# Patient Record
Sex: Male | Born: 1960 | Race: White | Hispanic: No | Marital: Married | Smoking: Never smoker
Health system: Southern US, Community
[De-identification: ages and names within clinical notes are randomized; demographics above are authoritative.]

## PROBLEM LIST (undated history)

## (undated) DIAGNOSIS — J189 Pneumonia, unspecified organism: Secondary | ICD-10-CM

## (undated) DIAGNOSIS — D649 Anemia, unspecified: Secondary | ICD-10-CM

## (undated) DIAGNOSIS — K219 Gastro-esophageal reflux disease without esophagitis: Secondary | ICD-10-CM

## (undated) DIAGNOSIS — H9319 Tinnitus, unspecified ear: Secondary | ICD-10-CM

## (undated) DIAGNOSIS — C8190 Hodgkin lymphoma, unspecified, unspecified site: Secondary | ICD-10-CM

## (undated) DIAGNOSIS — F32A Depression, unspecified: Secondary | ICD-10-CM

## (undated) DIAGNOSIS — K649 Unspecified hemorrhoids: Secondary | ICD-10-CM

## (undated) DIAGNOSIS — F329 Major depressive disorder, single episode, unspecified: Secondary | ICD-10-CM

## (undated) DIAGNOSIS — E785 Hyperlipidemia, unspecified: Secondary | ICD-10-CM

## (undated) DIAGNOSIS — E291 Testicular hypofunction: Secondary | ICD-10-CM

## (undated) DIAGNOSIS — E119 Type 2 diabetes mellitus without complications: Secondary | ICD-10-CM

## (undated) DIAGNOSIS — K635 Polyp of colon: Secondary | ICD-10-CM

## (undated) DIAGNOSIS — I10 Essential (primary) hypertension: Secondary | ICD-10-CM

## (undated) HISTORY — PX: EYE SURGERY: SHX253

## (undated) HISTORY — DX: Hodgkin lymphoma, unspecified, unspecified site: C81.90

---

## 2010-10-21 ENCOUNTER — Ambulatory Visit: Payer: Self-pay | Admitting: Ophthalmology

## 2010-11-18 ENCOUNTER — Ambulatory Visit: Payer: Self-pay | Admitting: Ophthalmology

## 2014-07-22 ENCOUNTER — Ambulatory Visit: Payer: Self-pay | Admitting: Gastroenterology

## 2014-11-04 LAB — SURGICAL PATHOLOGY

## 2015-04-04 ENCOUNTER — Encounter: Payer: Self-pay | Admitting: *Deleted

## 2015-04-07 ENCOUNTER — Ambulatory Visit: Payer: PRIVATE HEALTH INSURANCE | Admitting: Anesthesiology

## 2015-04-07 ENCOUNTER — Ambulatory Visit
Admission: RE | Admit: 2015-04-07 | Discharge: 2015-04-07 | Disposition: A | Discharge status: Home / Self Care | Payer: PRIVATE HEALTH INSURANCE | Source: Ambulatory Visit | Attending: Gastroenterology | Admitting: Gastroenterology

## 2015-04-07 ENCOUNTER — Ambulatory Visit: Admit: 2015-04-07 | Payer: Self-pay | Admitting: Gastroenterology

## 2015-04-07 ENCOUNTER — Encounter
Admission: RE | Disposition: A | Discharge status: Home / Self Care | Payer: Self-pay | Source: Ambulatory Visit | Attending: Gastroenterology

## 2015-04-07 ENCOUNTER — Encounter: Payer: Self-pay | Admitting: *Deleted

## 2015-04-07 DIAGNOSIS — E785 Hyperlipidemia, unspecified: Secondary | ICD-10-CM | POA: Insufficient documentation

## 2015-04-07 DIAGNOSIS — Z86010 Personal history of colonic polyps: Secondary | ICD-10-CM | POA: Diagnosis present

## 2015-04-07 DIAGNOSIS — Z79899 Other long term (current) drug therapy: Secondary | ICD-10-CM | POA: Insufficient documentation

## 2015-04-07 DIAGNOSIS — F329 Major depressive disorder, single episode, unspecified: Secondary | ICD-10-CM | POA: Diagnosis not present

## 2015-04-07 DIAGNOSIS — E291 Testicular hypofunction: Secondary | ICD-10-CM | POA: Diagnosis not present

## 2015-04-07 DIAGNOSIS — E119 Type 2 diabetes mellitus without complications: Secondary | ICD-10-CM | POA: Diagnosis not present

## 2015-04-07 DIAGNOSIS — D120 Benign neoplasm of cecum: Secondary | ICD-10-CM | POA: Insufficient documentation

## 2015-04-07 DIAGNOSIS — Z791 Long term (current) use of non-steroidal anti-inflammatories (NSAID): Secondary | ICD-10-CM | POA: Diagnosis not present

## 2015-04-07 DIAGNOSIS — I10 Essential (primary) hypertension: Secondary | ICD-10-CM | POA: Diagnosis not present

## 2015-04-07 HISTORY — DX: Hyperlipidemia, unspecified: E78.5

## 2015-04-07 HISTORY — DX: Essential (primary) hypertension: I10

## 2015-04-07 HISTORY — DX: Type 2 diabetes mellitus without complications: E11.9

## 2015-04-07 HISTORY — DX: Polyp of colon: K63.5

## 2015-04-07 HISTORY — PX: COLONOSCOPY WITH PROPOFOL: SHX5780

## 2015-04-07 HISTORY — DX: Tinnitus, unspecified ear: H93.19

## 2015-04-07 HISTORY — DX: Unspecified hemorrhoids: K64.9

## 2015-04-07 HISTORY — DX: Major depressive disorder, single episode, unspecified: F32.9

## 2015-04-07 HISTORY — DX: Testicular hypofunction: E29.1

## 2015-04-07 HISTORY — DX: Depression, unspecified: F32.A

## 2015-04-07 LAB — GLUCOSE, CAPILLARY: Glucose-Capillary: 88 mg/dL (ref 65–99)

## 2015-04-07 SURGERY — COLONOSCOPY WITH PROPOFOL
Anesthesia: General

## 2015-04-07 MED ORDER — SODIUM CHLORIDE 0.9 % IV SOLN: INTRAVENOUS | Status: DC

## 2015-04-07 MED ORDER — SODIUM CHLORIDE 0.9 % IV SOLN
INTRAVENOUS | Status: DC
  Administered 2015-04-07: 1000 mL via INTRAVENOUS

## 2015-04-07 MED ORDER — LIDOCAINE HCL (CARDIAC) 20 MG/ML IV SOLN
INTRAVENOUS | Status: DC | PRN
  Administered 2015-04-07: 30 mg via INTRAVENOUS

## 2015-04-07 MED ORDER — PROPOFOL 10 MG/ML IV BOLUS
INTRAVENOUS | Status: DC | PRN
  Administered 2015-04-07: 110 mg via INTRAVENOUS

## 2015-04-07 MED ORDER — PROPOFOL 500 MG/50ML IV EMUL
INTRAVENOUS | Status: DC | PRN
  Administered 2015-04-07: 125 ug/kg/min via INTRAVENOUS

## 2015-04-07 NOTE — H&P (Signed)
    Primary Care Physician:  [place] Pink, MD Primary Gastroenterologist:  Dr. Candace Cruise  Pre-Procedure History & Physical: HPI:  Erik Buchanan is a 54 y.o. male is here for an colonoscopy.   Past Medical History  Diagnosis Date  . Hypertension   . Depression   . Diabetes mellitus without complication   . Hyperlipidemia   . Testicular hypofunction   . Hemorrhoids   . Tinnitus   . Colon polyps     No past surgical history on file.  Prior to Admission medications   Medication Sig Start Date End Date Taking? Authorizing Provider  amLODipine (NORVASC) 5 MG tablet Take 5 mg by mouth daily.   Yes Historical Provider, MD  buPROPion (WELLBUTRIN XL) 150 MG 24 hr tablet Take 150 mg by mouth daily.   Yes Historical Provider, MD  Cholecalciferol 2000 UNITS CAPS Take by mouth.   Yes Historical Provider, MD  co-enzyme Q-10 30 MG capsule Take 30 mg by mouth 3 (three) times daily.   Yes Historical Provider, MD  ibuprofen (ADVIL,MOTRIN) 200 MG tablet Take 200 mg by mouth every 6 (six) hours as needed.   Yes Historical Provider, MD  lisinopril (PRINIVIL,ZESTRIL) 10 MG tablet Take 10 mg by mouth daily.   Yes Historical Provider, MD  metFORMIN (GLUCOPHAGE-XR) 500 MG 24 hr tablet Take 500 mg by mouth daily with breakfast.   Yes Historical Provider, MD  OMEGA-3 FATTY ACIDS PO Take by mouth.   Yes Historical Provider, MD  simvastatin (ZOCOR) 20 MG tablet Take by mouth daily.   Yes Historical Provider, MD  Testosterone Cypionate 200 MG/ML KIT Inject into the muscle.   Yes Historical Provider, MD  tetrahydrozoline-zinc (VISINE-AC) 0.05-0.25 % ophthalmic solution 2 drops 3 (three) times daily as needed.   Yes Historical Provider, MD  Triamcinolone Acetonide (NASACORT AQ NA) Place into the nose.   Yes Historical Provider, MD    Allergies as of 04/04/2015  . (Not on File)    No family history on file.  Social History   Social History  . Marital Status: Married    Spouse Name: N/A  . Number of  Children: N/A  . Years of Education: N/A   Occupational History  . Not on file.   Social History Main Topics  . Smoking status: Not on file  . Smokeless tobacco: Not on file  . Alcohol Use: Not on file  . Drug Use: Not on file  . Sexual Activity: Not on file   Other Topics Concern  . Not on file   Social History Narrative  . No narrative on file    Review of Systems: See HPI, otherwise negative ROS  Physical Exam: There were no vitals taken for this visit. General:   Alert,  pleasant and cooperative in NAD Head:  Normocephalic and atraumatic. Neck:  Supple; no masses or thyromegaly. Lungs:  Clear throughout to auscultation.    Heart:  Regular rate and rhythm. Abdomen:  Soft, nontender and nondistended. Normal bowel sounds, without guarding, and without rebound.   Neurologic:  Alert and  oriented x4;  grossly normal neurologically.  Impression/Plan: Erik Buchanan is here for an colonoscopy to be performed for personal hx of colon polyps. Risks, benefits, limitations, and alternatives regarding colonoscopy have been reviewed with the patient.  Questions have been answered.  All parties agreeable.   OH, Lupita Dawn, MD  04/07/2015, 9:45 AM

## 2015-04-07 NOTE — Op Note (Signed)
[facility] Gastroenterology Patient Name: Erik Buchanan Procedure Date: 04/07/2015 11:13 AM MRN: [id number] Account #: [id number] Date of Birth: 1960/10/21 Admit Type: Outpatient Age: 54 Room: [facility] ENDO ROOM 4 Gender: Male Note Status: Finalized Procedure:         Colonoscopy Indications:       Personal history of colonic polyps Providers:         Lupita Dawn. Candace Cruise, MD Referring MD:      Irven Easterly. Kary Kos, MD (Referring MD) Medicines:         Monitored Anesthesia Care Complications:     No immediate complications. Procedure:         Pre-Anesthesia Assessment:                    - Prior to the procedure, a History and Physical was                     performed, and patient medications, allergies and                     sensitivities were reviewed. The patient's tolerance of                     previous anesthesia was reviewed.                    - The risks and benefits of the procedure and the sedation                     options and risks were discussed with the patient. All                     questions were answered and informed consent was obtained.                    - After reviewing the risks and benefits, the patient was                     deemed in satisfactory condition to undergo the procedure.                    After obtaining informed consent, the colonoscope was                     passed under direct vision. Throughout the procedure, the                     patient's blood pressure, pulse, and oxygen saturations                     were monitored continuously. The Colonoscope was                     introduced through the anus and advanced to the the cecum,                     identified by appendiceal orifice and ileocecal valve. The                     colonoscopy was performed without difficulty. The patient                     tolerated the procedure well. The quality of the bowel  preparation was fair. Findings:      A  medium polyp was found in the cecum. The polyp was sessile. The polyp       was removed with a hot snare. Resection and retrieval were complete.       This polyp was remnant from previous polyp. Clip still remains. Both the       polyp and clip snared off. To prevent bleeding post-intervention, three       hemostatic clips were successfully placed. There was no bleeding at the       end of the procedure.      The exam was otherwise without abnormality. Impression:        - One medium polyp in the cecum. Resected and retrieved.                     Clips were placed.                    - The examination was otherwise normal. Recommendation:    - Discharge patient to home.                    - Await pathology results.                    - The findings and recommendations were discussed with the                     patient. Procedure Code(s): --- Professional ---                    [phone], Colonoscopy, flexible; with removal of tumor(s),                     polyp(s), or other lesion(s) by snare technique Diagnosis Code(s): --- Professional ---                    D12.0, Benign neoplasm of cecum                    Z86.010, Personal history of colonic polyps CPT copyright 2014 American Medical Association. All rights reserved. The codes documented in this report are preliminary and upon coder review may  be revised to meet current compliance requirements. Hulen Luster, MD 04/07/2015 11:40:37 AM This report has been signed electronically. Number of Addenda: 0 Note Initiated On: 04/07/2015 11:13 AM Scope Withdrawal Time: 0 hours 13 minutes 58 seconds  Total Procedure Duration: 0 hours 19 minutes 16 seconds       [facility]

## 2015-04-07 NOTE — Anesthesia Preprocedure Evaluation (Signed)
Anesthesia Evaluation  Patient identified by MRN, date of birth, ID band Patient awake    Reviewed: Allergy & Precautions, H&P , NPO status , Patient's Chart, lab work & pertinent test results, reviewed documented beta blocker date and time   History of Anesthesia Complications Negative for: history of anesthetic complications  Airway Mallampati: II  TM Distance: >3 FB Neck ROM: full    Dental no notable dental hx. (+) Teeth Intact   Pulmonary neg pulmonary ROS,    Pulmonary exam normal breath sounds clear to auscultation       Cardiovascular Exercise Tolerance: Good hypertension, On Medications (-) angina(-) CAD, (-) Past MI, (-) Cardiac Stents and (-) CABG Normal cardiovascular exam(-) dysrhythmias (-) Valvular Problems/Murmurs Rhythm:regular Rate:Normal     Neuro/Psych PSYCHIATRIC DISORDERS (Depression) negative neurological ROS     GI/Hepatic negative GI ROS, Neg liver ROS,   Endo/Other  diabetes, Well Controlled, Oral Hypoglycemic Agents  Renal/GU negative Renal ROS  negative genitourinary   Musculoskeletal   Abdominal   Peds  Hematology negative hematology ROS (+)   Anesthesia Other Findings Past Medical History:   Hypertension                                                 Depression                                                   Diabetes mellitus without complication                       Hyperlipidemia                                               Testicular hypofunction                                      Hemorrhoids                                                  Tinnitus                                                     Colon polyps                                                 Reproductive/Obstetrics negative OB ROS                             Anesthesia Physical Anesthesia Plan  ASA: II  Anesthesia Plan: General  Post-op Pain Management:    Induction:    Airway Management Planned:   Additional Equipment:   Intra-op Plan:   Post-operative Plan:   Informed Consent: I have reviewed the patients History and Physical, chart, labs and discussed the procedure including the risks, benefits and alternatives for the proposed anesthesia with the patient or authorized representative who has indicated his/her understanding and acceptance.   Dental Advisory Given  Plan Discussed with: Anesthesiologist, CRNA and Surgeon  Anesthesia Plan Comments:         Anesthesia Quick Evaluation

## 2015-04-07 NOTE — Transfer of Care (Signed)
Immediate Anesthesia Transfer of Care Note  Patient: Erik Buchanan  Procedure(s) Performed: Procedure(s): COLONOSCOPY WITH PROPOFOL (N/A)  Patient Location: Endoscopy Unit  Anesthesia Type:General  Level of Consciousness: sedated  Airway & Oxygen Therapy: Patient Spontanous Breathing and Patient connected to nasal cannula oxygen  Post-op Assessment: Report given to RN and Post -op Vital signs reviewed and stable  Post vital signs: Reviewed and stable  Last Vitals:  Filed Vitals:   04/07/15 1001  BP: 129/71  Pulse: 60  Temp: 36.9 C  Resp: 18    Complications: No apparent anesthesia complications

## 2015-04-08 ENCOUNTER — Encounter: Payer: Self-pay | Admitting: Gastroenterology

## 2015-04-09 LAB — SURGICAL PATHOLOGY

## 2015-04-09 NOTE — Anesthesia Postprocedure Evaluation (Signed)
  Anesthesia Post-op Note  Patient: Erik Buchanan  Procedure(s) Performed: Procedure(s): COLONOSCOPY WITH PROPOFOL (N/A)  Anesthesia type:General  Patient location: PACU  Post pain: Pain level controlled  Post assessment: Post-op Vital signs reviewed, Patient's Cardiovascular Status Stable, Respiratory Function Stable, Patent Airway and No signs of Nausea or vomiting  Post vital signs: Reviewed and stable  Last Vitals:  Filed Vitals:   04/07/15 1210  BP: 132/83  Pulse: 53  Temp:   Resp: 16    Level of consciousness: awake, alert  and patient cooperative  Complications: No apparent anesthesia complications

## 2017-02-13 ENCOUNTER — Emergency Department: Payer: 59

## 2017-02-13 ENCOUNTER — Emergency Department
Admission: EM | Admit: 2017-02-13 | Discharge: 2017-02-13 | Disposition: A | Discharge status: Home / Self Care | Payer: 59 | Attending: Emergency Medicine | Admitting: Emergency Medicine

## 2017-02-13 DIAGNOSIS — Y9389 Activity, other specified: Secondary | ICD-10-CM | POA: Diagnosis not present

## 2017-02-13 DIAGNOSIS — S42142A Displaced fracture of glenoid cavity of scapula, left shoulder, initial encounter for closed fracture: Secondary | ICD-10-CM | POA: Insufficient documentation

## 2017-02-13 DIAGNOSIS — Y929 Unspecified place or not applicable: Secondary | ICD-10-CM | POA: Insufficient documentation

## 2017-02-13 DIAGNOSIS — Z79899 Other long term (current) drug therapy: Secondary | ICD-10-CM | POA: Insufficient documentation

## 2017-02-13 DIAGNOSIS — Y999 Unspecified external cause status: Secondary | ICD-10-CM | POA: Diagnosis not present

## 2017-02-13 DIAGNOSIS — I10 Essential (primary) hypertension: Secondary | ICD-10-CM | POA: Insufficient documentation

## 2017-02-13 DIAGNOSIS — Z7984 Long term (current) use of oral hypoglycemic drugs: Secondary | ICD-10-CM | POA: Insufficient documentation

## 2017-02-13 DIAGNOSIS — S42152A Displaced fracture of neck of scapula, left shoulder, initial encounter for closed fracture: Secondary | ICD-10-CM | POA: Insufficient documentation

## 2017-02-13 DIAGNOSIS — S32010A Wedge compression fracture of first lumbar vertebra, initial encounter for closed fracture: Secondary | ICD-10-CM | POA: Diagnosis not present

## 2017-02-13 DIAGNOSIS — E119 Type 2 diabetes mellitus without complications: Secondary | ICD-10-CM | POA: Diagnosis not present

## 2017-02-13 DIAGNOSIS — S4992XA Unspecified injury of left shoulder and upper arm, initial encounter: Secondary | ICD-10-CM | POA: Diagnosis present

## 2017-02-13 MED ORDER — OXYCODONE-ACETAMINOPHEN 5-325 MG PO TABS: 1.0000 | ORAL_TABLET | ORAL | 0 refills | Status: DC | PRN

## 2017-02-13 MED ORDER — HYDROMORPHONE HCL 1 MG/ML IJ SOLN
1.0000 mg | Freq: Once | INTRAMUSCULAR | Status: AC
  Administered 2017-02-13: 1 mg via INTRAVENOUS
  Filled 2017-02-13: qty 1

## 2017-02-13 MED ORDER — MORPHINE SULFATE (PF) 4 MG/ML IV SOLN
4.0000 mg | Freq: Once | INTRAVENOUS | Status: AC
  Administered 2017-02-13: 4 mg via INTRAVENOUS
  Filled 2017-02-13: qty 1

## 2017-02-13 NOTE — ED Notes (Signed)
[employer] rep at bedside to fit patient for back brace.
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
## 2017-02-13 NOTE — Discharge Instructions (Signed)
Please seek medical attention for any high fevers, chest pain, shortness of breath, change in behavior, persistent vomiting, bloody stool or any other new or concerning symptoms.  

## 2017-02-13 NOTE — ED Notes (Signed)
Pt returned from Xray at this time  

## 2017-02-13 NOTE — ED Notes (Signed)
Reviewed d/c instructions, follow-up care, prescription, use of ice, sling care, back brace use with patient. Pt verbalized understanding
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
## 2020-08-28 ENCOUNTER — Other Ambulatory Visit: Payer: Self-pay

## 2020-08-28 ENCOUNTER — Other Ambulatory Visit
Admission: RE | Admit: 2020-08-28 | Discharge: 2020-08-28 | Disposition: A | Discharge status: Home / Self Care | Payer: 59 | Source: Ambulatory Visit | Attending: Internal Medicine | Admitting: Internal Medicine

## 2020-08-28 DIAGNOSIS — Z01812 Encounter for preprocedural laboratory examination: Secondary | ICD-10-CM | POA: Insufficient documentation

## 2020-08-28 DIAGNOSIS — Z20822 Contact with and (suspected) exposure to covid-19: Secondary | ICD-10-CM | POA: Insufficient documentation

## 2020-08-28 LAB — SARS CORONAVIRUS 2 (TAT 6-24 HRS): SARS Coronavirus 2: NEGATIVE

## 2020-09-01 ENCOUNTER — Ambulatory Visit: Payer: 59 | Admitting: Anesthesiology

## 2020-09-01 ENCOUNTER — Ambulatory Visit
Admission: RE | Admit: 2020-09-01 | Discharge: 2020-09-01 | Disposition: A | Discharge status: Home / Self Care | Payer: 59 | Attending: Internal Medicine | Admitting: Internal Medicine

## 2020-09-01 ENCOUNTER — Other Ambulatory Visit: Payer: Self-pay

## 2020-09-01 ENCOUNTER — Encounter: Payer: Self-pay | Admitting: Internal Medicine

## 2020-09-01 ENCOUNTER — Encounter
Admission: RE | Disposition: A | Discharge status: Home / Self Care | Payer: Self-pay | Source: Home / Self Care | Attending: Internal Medicine

## 2020-09-01 DIAGNOSIS — Z7984 Long term (current) use of oral hypoglycemic drugs: Secondary | ICD-10-CM | POA: Diagnosis not present

## 2020-09-01 DIAGNOSIS — K640 First degree hemorrhoids: Secondary | ICD-10-CM | POA: Diagnosis not present

## 2020-09-01 DIAGNOSIS — K5730 Diverticulosis of large intestine without perforation or abscess without bleeding: Secondary | ICD-10-CM | POA: Insufficient documentation

## 2020-09-01 DIAGNOSIS — Z86010 Personal history of colonic polyps: Secondary | ICD-10-CM | POA: Diagnosis not present

## 2020-09-01 DIAGNOSIS — Q438 Other specified congenital malformations of intestine: Secondary | ICD-10-CM | POA: Diagnosis not present

## 2020-09-01 DIAGNOSIS — Z79899 Other long term (current) drug therapy: Secondary | ICD-10-CM | POA: Insufficient documentation

## 2020-09-01 DIAGNOSIS — Z885 Allergy status to narcotic agent status: Secondary | ICD-10-CM | POA: Diagnosis not present

## 2020-09-01 DIAGNOSIS — Z1211 Encounter for screening for malignant neoplasm of colon: Secondary | ICD-10-CM | POA: Insufficient documentation

## 2020-09-01 HISTORY — PX: COLONOSCOPY WITH PROPOFOL: SHX5780

## 2020-09-01 LAB — GLUCOSE, CAPILLARY: Glucose-Capillary: 123 mg/dL — ABNORMAL HIGH (ref 70–99)

## 2020-09-01 SURGERY — COLONOSCOPY WITH PROPOFOL
Anesthesia: General

## 2020-09-01 MED ORDER — PROPOFOL 500 MG/50ML IV EMUL
INTRAVENOUS | Status: DC | PRN
  Administered 2020-09-01: 130 ug/kg/min via INTRAVENOUS

## 2020-09-01 MED ORDER — PROPOFOL 500 MG/50ML IV EMUL
INTRAVENOUS | Status: AC
  Filled 2020-09-01: qty 50

## 2020-09-01 MED ORDER — PROPOFOL 10 MG/ML IV BOLUS
INTRAVENOUS | Status: DC | PRN
  Administered 2020-09-01: 100 mg via INTRAVENOUS

## 2020-09-01 MED ORDER — LIDOCAINE HCL (CARDIAC) PF 100 MG/5ML IV SOSY
PREFILLED_SYRINGE | INTRAVENOUS | Status: DC | PRN
  Administered 2020-09-01: 50 mg via INTRAVENOUS

## 2020-09-01 MED ORDER — SODIUM CHLORIDE 0.9 % IV SOLN
INTRAVENOUS | Status: DC
  Administered 2020-09-01: 20 mL/h via INTRAVENOUS

## 2020-09-01 NOTE — Op Note (Signed)
[facility] Gastroenterology Patient Name: Erik Buchanan Procedure Date: 09/01/2020 10:24 AM MRN: [id number] Account #: [id number] Date of Birth: 10-18-1960 Admit Type: Outpatient Age: 60 Room: [facility] ENDO ROOM 2 Gender: Male Note Status: Finalized Procedure:             Colonoscopy Indications:           Surveillance: Personal history of adenomatous polyps                         on last colonoscopy > 5 years ago Providers:             Lorie Apley K. Alice Reichert MD, MD Referring MD:          Irven Easterly. Kary Kos, MD (Referring MD) Medicines:             Propofol per Anesthesia Complications:         No immediate complications. Procedure:             Pre-Anesthesia Assessment:                        - The risks and benefits of the procedure and the                         sedation options and risks were discussed with the                         patient. All questions were answered and informed                         consent was obtained.                        - Patient identification and proposed procedure were                         verified prior to the procedure by the nurse. The                         procedure was verified in the procedure room.                        - ASA Grade Assessment: III - A patient with severe                         systemic disease.                        - After reviewing the risks and benefits, the patient                         was deemed in satisfactory condition to undergo the                         procedure.                        After obtaining informed consent, the colonoscope was                         passed under direct  vision. Throughout the procedure,                         the patient's blood pressure, pulse, and oxygen                         saturations were monitored continuously. The                         Colonoscope was introduced through the anus and                         advanced to the the cecum,  identified by appendiceal                         orifice and ileocecal valve. The colonoscopy was                         technically difficult and complex due to poor                         endoscopic visualization. Successful completion of the                         procedure was aided by lavage. The colonoscopy was                         technically difficult and complex due to a redundant                         colon. Successful completion of the procedure was                         aided by applying abdominal pressure. The patient                         tolerated the procedure well. The quality of the bowel                         preparation was fair. The colonoscopy was technically                         difficult and complex due to significant looping.                         Successful completion of the procedure was aided by                         withdrawing the scope and replacing with the adult                         colonoscope. The patient tolerated the procedure well. Findings:      The perianal and digital rectal examinations were normal. Pertinent       negatives include normal sphincter tone and no palpable rectal lesions.      Non-bleeding internal hemorrhoids were found during retroflexion. The       hemorrhoids were Grade I (internal hemorrhoids that do not prolapse).  Many small-mouthed diverticula were found in the sigmoid colon.      A medium polyp was found in the cecum. The polyp was carpet-like.       Biopsies were taken with a cold forceps for histology. Unable to resect       due to position of the polyp and suboptimal quality of the prep      Extensive amounts of stool was found in the transverse colon, in the       ascending colon and in the cecum, interfering with visualization. Lavage       of the area was performed using a large amount of sterile water,       resulting in incomplete clearance with fair visualization.      Non-bleeding  internal hemorrhoids were found during retroflexion. The       hemorrhoids were Grade I (internal hemorrhoids that do not prolapse).      The exam was otherwise without abnormality. Impression:            - Preparation of the colon was fair.                        - Non-bleeding internal hemorrhoids.                        - Diverticulosis in the sigmoid colon.                        - One medium polyp in the cecum. Biopsied.                        - Stool in the transverse colon, in the ascending                         colon and in the cecum.                        - Non-bleeding internal hemorrhoids.                        - The examination was otherwise normal. Recommendation:        - Patient has a contact number available for                         emergencies. The signs and symptoms of potential                         delayed complications were discussed with the patient.                         Return to normal activities tomorrow. Written                         discharge instructions were provided to the patient.                        - Resume previous diet.                        - Continue present medications.                        -  Await pathology results.                        - [facility] referral for definitive resection of complex                         polyp in the cecum                        - The findings and recommendations were discussed with                         the patient. Procedure Code(s):     --- Professional ---                        [phone], Colonoscopy, flexible; with biopsy, single or                         multiple Diagnosis Code(s):     --- Professional ---                        K57.30, Diverticulosis of large intestine without                         perforation or abscess without bleeding                        K63.5, Polyp of colon                        K64.0, First degree hemorrhoids                        Z86.010, Personal history of  colonic polyps CPT copyright 2019 [employer]. All rights reserved. The codes documented in this report are preliminary and upon coder review may  be revised to meet current compliance requirements. Efrain Sella MD, MD 09/01/2020 11:44:18 AM This report has been signed electronically. Number of Addenda: 0 Note Initiated On: 09/01/2020 10:24 AM Scope Withdrawal Time: 0 hours 4 minutes 31 seconds  Total Procedure Duration: 0 hours 48 minutes 50 seconds  Estimated Blood Loss:  Estimated blood loss: none.      [facility]

## 2020-09-01 NOTE — Anesthesia Preprocedure Evaluation (Signed)
Anesthesia Evaluation  Patient identified by MRN, date of birth, ID band Patient awake    Reviewed: Allergy & Precautions, H&P , NPO status , Patient's Chart, lab work & pertinent test results  History of Anesthesia Complications Negative for: history of anesthetic complications  Airway Mallampati: III  TM Distance: >3 FB Neck ROM: full    Dental  (+) Chipped   Pulmonary neg pulmonary ROS, neg shortness of breath,    Pulmonary exam normal        Cardiovascular Exercise Tolerance: Good hypertension, (-) angina(-) Past MI Normal cardiovascular exam     Neuro/Psych PSYCHIATRIC DISORDERS negative neurological ROS  negative psych ROS   GI/Hepatic Neg liver ROS, GERD  Medicated and Controlled,  Endo/Other  diabetes, Type 2  Renal/GU negative Renal ROS  negative genitourinary   Musculoskeletal   Abdominal   Peds  Hematology negative hematology ROS (+)   Anesthesia Other Findings Past Medical History: No date: Colon polyps No date: Depression No date: Diabetes mellitus without complication (HCC) No date: Hemorrhoids No date: Hyperlipidemia No date: Hypertension 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 No date: Tinnitus  Past Surgical History: 04/07/2015: COLONOSCOPY WITH PROPOFOL; N/A     Comment:  Procedure: COLONOSCOPY WITH PROPOFOL;  Surgeon: Hulen Luster, MD;  Location: [facility] ENDOSCOPY;  Service:               Gastroenterology;  Laterality: N/A; No date: EYE SURGERY     Comment:  cataract  BMI    Body Mass Index: 33.33 kg/m      Reproductive/Obstetrics negative OB ROS                             Anesthesia Physical Anesthesia Plan  ASA: II  Anesthesia Plan: General   Post-op Pain Management:    Induction: Intravenous  PONV Risk Score and Plan: Propofol infusion and TIVA  Airway Management Planned: Natural Airway and Nasal Cannula  Additional Equipment:    Intra-op Plan:   Post-operative Plan:   Informed Consent: I have reviewed the patients History and Physical, chart, labs and discussed the procedure including the risks, benefits and alternatives for the proposed anesthesia with the patient or authorized representative who has indicated his/her understanding and acceptance.     Dental Advisory Given  Plan Discussed with: Anesthesiologist, CRNA and Surgeon  Anesthesia Plan Comments: (Patient consented for risks of anesthesia including but not limited to:  - adverse reactions to medications - risk of airway placement if required - damage to eyes, teeth, lips or other oral mucosa - nerve damage due to positioning  - sore throat or hoarseness - Damage to heart, brain, nerves, lungs, other parts of body or loss of life  Patient voiced understanding.)        Anesthesia Quick Evaluation

## 2020-09-01 NOTE — Anesthesia Postprocedure Evaluation (Signed)
Anesthesia Post Note  Patient: [name]  Procedure(s) Performed: COLONOSCOPY WITH PROPOFOL (N/A )  Patient location during evaluation: Phase II Anesthesia Type: General Level of consciousness: awake and alert, awake and oriented Pain management: pain level controlled Vital Signs Assessment: post-procedure vital signs reviewed and stable Respiratory status: spontaneous breathing, nonlabored ventilation and respiratory function stable Cardiovascular status: blood pressure returned to baseline and stable Postop Assessment: no apparent nausea or vomiting Anesthetic complications: no   No complications documented.   Last Vitals:  Vitals:   09/01/20 1140 09/01/20 1150  BP: 111/66 113/75  Pulse: (!) 52 (!) 52  Resp: (!) 9 13  Temp:    SpO2: 97% 100%    Last Pain:  Vitals:   09/01/20 1150  TempSrc:   PainSc: 2                  Phill Mutter

## 2020-09-01 NOTE — Interval H&P Note (Signed)
History and Physical Interval Note:  09/01/2020 10:22 AM  Erik Buchanan  has presented today for surgery, with the diagnosis of PERSONAL HX.OF COLON POLYPS.  The various methods of treatment have been discussed with the patient and family. After consideration of risks, benefits and other options for treatment, the patient has consented to  Procedure(s): COLONOSCOPY WITH PROPOFOL (N/A) as a surgical intervention.  The patient's history has been reviewed, patient examined, no change in status, stable for surgery.  I have reviewed the patient's chart and labs.  Questions were answered to the patient's satisfaction.     [place], [place]

## 2020-09-01 NOTE — Transfer of Care (Signed)
Immediate Anesthesia Transfer of Care Note  Patient: Erik Buchanan  Procedure(s) Performed: COLONOSCOPY WITH PROPOFOL (N/A )  Patient Location: PACU and Endoscopy Unit  Anesthesia Type:General  Level of Consciousness: drowsy and patient cooperative  Airway & Oxygen Therapy: Patient Spontanous Breathing  Post-op Assessment: Report given to RN and Post -op Vital signs reviewed and stable  Post vital signs: Reviewed and stable  Last Vitals:  Vitals Value Taken Time  BP 97/76 09/01/20 1130  Temp 36.1 C 09/01/20 1130  Pulse 59 09/01/20 1130  Resp 16 09/01/20 1130  SpO2 96 % 09/01/20 1130    Last Pain:  Vitals:   09/01/20 1130  TempSrc: Temporal  PainSc: Asleep         Complications: No complications documented.

## 2020-09-01 NOTE — H&P (Signed)
Outpatient short stay form Pre-procedure 09/01/2020 10:21 AM Teodoro K. Alice Reichert, M.D.  Primary Physician: [place] Pink, M.D.  Reason for visit:  Personal history of adenomatous colon polyp x 1 - 2016  History of present illness:                           Patient presents for colonoscopy for a personal hx of colon polyps. The patient denies abdominal pain, abnormal weight loss or rectal bleeding.      Current Facility-Administered Medications:  .  0.9 %  sodium chloride infusion, , Intravenous, Continuous, [place], Benay Pike, MD, Last Rate: 20 mL/hr at 09/01/20 0959, 20 mL/hr at 09/01/20 0623  Medications Prior to Admission  Medication Sig Dispense Refill Last Dose  . amLODipine (NORVASC) 5 MG tablet Take 5 mg by mouth daily.   09/01/2020 at 0600  . buPROPion (WELLBUTRIN XL) 150 MG 24 hr tablet Take 150 mg by mouth daily.   09/01/2020 at 0600  . Cholecalciferol 2000 UNITS CAPS Take by mouth.   Past Week at Unknown time  . co-enzyme Q-10 30 MG capsule Take 30 mg by mouth 3 (three) times daily.   Past Week at Unknown time  . ibuprofen (ADVIL,MOTRIN) 200 MG tablet Take 200 mg by mouth every 6 (six) hours as needed.   Past Week at Unknown time  . lisinopril (PRINIVIL,ZESTRIL) 10 MG tablet Take 10 mg by mouth daily.   09/01/2020 at 0600  . metFORMIN (GLUCOPHAGE-XR) 500 MG 24 hr tablet Take 500 mg by mouth daily with breakfast.   Past Week at Unknown time  . OMEGA-3 FATTY ACIDS PO Take by mouth.   Past Week at Unknown time  . oxyCODONE-acetaminophen (ROXICET) 5-325 MG tablet Take 1 tablet by mouth every 4 (four) hours as needed for severe pain. 20 tablet 0 Past Week at Unknown time  . simvastatin (ZOCOR) 20 MG tablet Take by mouth daily.   Past Week at Unknown time  . Testosterone Cypionate 200 MG/ML KIT Inject into the muscle.   Past Week at Unknown time  . tetrahydrozoline-zinc (VISINE-AC) 0.05-0.25 % ophthalmic solution 2 drops 3 (three) times daily as needed.   Past Week at Unknown time  .  Triamcinolone Acetonide (NASACORT AQ NA) Place into the nose.   Past Week at Unknown time     Allergies  Allergen Reactions  . Ultram [Tramadol Hcl] Other (See Comments)    verticgo and passed out     Past Medical History:  Diagnosis Date  . Colon polyps   . Depression   . Diabetes mellitus without complication ([place])   . Hemorrhoids   . Hyperlipidemia   . Hypertension   . Testicular hypofunction   . Tinnitus     Review of systems:  Otherwise negative.    Physical Exam  Gen: Alert, oriented. Appears stated age.  HEENT: [place]/AT. PERRLA. Lungs: CTA, no wheezes. CV: RR nl S1, S2. Abd: soft, benign, no masses. BS+ Ext: No edema. Pulses 2+    Planned procedures: Proceed with colonoscopy. The patient understands the nature of the planned procedure, indications, risks, alternatives and potential complications including but not limited to bleeding, infection, perforation, damage to internal organs and possible oversedation/side effects from anesthesia. The patient agrees and gives consent to proceed.  Please refer to procedure notes for findings, recommendations and patient disposition/instructions.     Teodoro K. Alice Reichert, M.D. Gastroenterology 09/01/2020  10:21 AM

## 2020-09-02 ENCOUNTER — Encounter: Payer: Self-pay | Admitting: Internal Medicine

## 2020-09-02 LAB — SURGICAL PATHOLOGY

## 2021-03-13 ENCOUNTER — Other Ambulatory Visit: Payer: Self-pay | Admitting: Family Medicine

## 2021-03-13 DIAGNOSIS — R221 Localized swelling, mass and lump, neck: Secondary | ICD-10-CM

## 2021-03-27 ENCOUNTER — Other Ambulatory Visit: Payer: Self-pay

## 2021-03-27 ENCOUNTER — Ambulatory Visit
Admission: RE | Admit: 2021-03-27 | Discharge: 2021-03-27 | Disposition: A | Discharge status: Home / Self Care | Payer: 59 | Source: Ambulatory Visit | Attending: Family Medicine | Admitting: Family Medicine

## 2021-03-27 DIAGNOSIS — R221 Localized swelling, mass and lump, neck: Secondary | ICD-10-CM | POA: Diagnosis not present

## 2021-04-01 ENCOUNTER — Other Ambulatory Visit (HOSPITAL_BASED_OUTPATIENT_CLINIC_OR_DEPARTMENT_OTHER): Payer: Self-pay | Admitting: Family Medicine

## 2021-04-01 ENCOUNTER — Other Ambulatory Visit: Payer: Self-pay | Admitting: Family Medicine

## 2021-04-01 DIAGNOSIS — R221 Localized swelling, mass and lump, neck: Secondary | ICD-10-CM

## 2021-04-01 DIAGNOSIS — R9389 Abnormal findings on diagnostic imaging of other specified body structures: Secondary | ICD-10-CM

## 2021-04-01 DIAGNOSIS — R599 Enlarged lymph nodes, unspecified: Secondary | ICD-10-CM

## 2021-04-16 ENCOUNTER — Other Ambulatory Visit: Payer: Self-pay

## 2021-04-16 ENCOUNTER — Ambulatory Visit: Payer: 59

## 2021-04-16 ENCOUNTER — Ambulatory Visit
Admission: RE | Admit: 2021-04-16 | Discharge: 2021-04-16 | Disposition: A | Discharge status: Home / Self Care | Payer: 59 | Source: Ambulatory Visit | Attending: Family Medicine | Admitting: Family Medicine

## 2021-04-16 DIAGNOSIS — R599 Enlarged lymph nodes, unspecified: Secondary | ICD-10-CM | POA: Diagnosis present

## 2021-04-16 DIAGNOSIS — R221 Localized swelling, mass and lump, neck: Secondary | ICD-10-CM | POA: Diagnosis present

## 2021-04-16 DIAGNOSIS — R9389 Abnormal findings on diagnostic imaging of other specified body structures: Secondary | ICD-10-CM

## 2021-04-16 LAB — POCT I-STAT CREATININE: Creatinine, Ser: 1.1 mg/dL (ref 0.61–1.24)

## 2021-04-16 MED ORDER — IOHEXOL 350 MG/ML SOLN
75.0000 mL | Freq: Once | INTRAVENOUS | Status: AC | PRN
  Administered 2021-04-16: 75 mL via INTRAVENOUS

## 2021-04-28 ENCOUNTER — Ambulatory Visit (INDEPENDENT_AMBULATORY_CARE_PROVIDER_SITE_OTHER): Payer: 59 | Admitting: Vascular Surgery

## 2021-04-28 ENCOUNTER — Encounter (INDEPENDENT_AMBULATORY_CARE_PROVIDER_SITE_OTHER): Payer: Self-pay | Admitting: Vascular Surgery

## 2021-04-28 ENCOUNTER — Other Ambulatory Visit: Payer: Self-pay

## 2021-04-28 ENCOUNTER — Other Ambulatory Visit: Payer: Self-pay | Admitting: Family Medicine

## 2021-04-28 VITALS — BP 147/71 | HR 61 | Resp 16 | Ht 71.5 in | Wt 246.8 lb

## 2021-04-28 DIAGNOSIS — I10 Essential (primary) hypertension: Secondary | ICD-10-CM | POA: Diagnosis not present

## 2021-04-28 DIAGNOSIS — R221 Localized swelling, mass and lump, neck: Secondary | ICD-10-CM | POA: Insufficient documentation

## 2021-04-28 DIAGNOSIS — E119 Type 2 diabetes mellitus without complications: Secondary | ICD-10-CM | POA: Diagnosis not present

## 2021-04-28 DIAGNOSIS — E291 Testicular hypofunction: Secondary | ICD-10-CM | POA: Insufficient documentation

## 2021-04-28 DIAGNOSIS — D649 Anemia, unspecified: Secondary | ICD-10-CM | POA: Insufficient documentation

## 2021-04-28 DIAGNOSIS — R599 Enlarged lymph nodes, unspecified: Secondary | ICD-10-CM

## 2021-04-28 DIAGNOSIS — E785 Hyperlipidemia, unspecified: Secondary | ICD-10-CM

## 2021-04-28 DIAGNOSIS — R9389 Abnormal findings on diagnostic imaging of other specified body structures: Secondary | ICD-10-CM

## 2021-04-28 NOTE — Assessment & Plan Note (Signed)
lipid control important in reducing the progression of atherosclerotic disease. Continue statin therapy  

## 2021-04-28 NOTE — Assessment & Plan Note (Signed)
blood glucose control important in reducing the progression of atherosclerotic disease. Also, involved in wound healing. On appropriate medications.  

## 2021-04-28 NOTE — Assessment & Plan Note (Signed)
blood pressure control important in reducing the progression of atherosclerotic disease. On appropriate oral medications.  

## 2021-04-28 NOTE — Progress Notes (Signed)
Patient ID: Erik Buchanan, male   DOB: 10/20/1960, 60 y.o.   MRN: [id number]  Chief Complaint  Patient presents with   New Patient (Initial Visit)    Ref [facility] consult for neck mass,possible jugular biopsy    HPI Erik Buchanan is a 60 y.o. male.  I am asked to see the patient by Dr. Kary Kos for evaluation of jugular nodal adenopathy and consideration for biopsy.  The patient has noticed swelling in the left neck and supraclavicular region now for 2 to 3 months.  He did have a trauma to the back of his head somewhere before this, but no clear other obvious inciting event or causative factor.  He underwent a CT scan of the neck which I have independently reviewed which shows very large adenopathy in the left neck and the jugular lymph nodes.  With this finding, he is referred for consideration of biopsy due to its proximity to the jugular vein.   Past Medical History:  Diagnosis Date   Colon polyps    Depression    Diabetes mellitus without complication ([place])    Hemorrhoids    Hyperlipidemia    Hypertension    Testicular hypofunction    Tinnitus     Past Surgical History:  Procedure Laterality Date   COLONOSCOPY WITH PROPOFOL N/A 04/07/2015   Procedure: COLONOSCOPY WITH PROPOFOL;  Surgeon: Hulen Luster, MD;  Location: [facility] ENDOSCOPY;  Service: Gastroenterology;  Laterality: N/A;   COLONOSCOPY WITH PROPOFOL N/A 09/01/2020   Procedure: COLONOSCOPY WITH PROPOFOL;  Surgeon: Toledo, Benay Pike, MD;  Location: [facility] ENDOSCOPY;  Service: Gastroenterology;  Laterality: N/A;   EYE SURGERY     cataract     Family History  Problem Relation Age of Onset   Arthritis Mother    Stroke Paternal Aunt   No bleeding or clotting disorders   Social History   Tobacco Use   Smoking status: Never   Smokeless tobacco: Never  Substance Use Topics   Alcohol use: Never   Drug use: Never    Allergies  Allergen Reactions   Ultram [Tramadol Hcl] Other (See Comments)    verticgo and  passed out    Current Outpatient Medications  Medication Sig Dispense Refill   amLODipine (NORVASC) 5 MG tablet Take 5 mg by mouth daily.     aspirin EC 81 MG tablet Take 81 mg by mouth daily. Swallow whole.     Azelastine HCl 137 MCG/SPRAY SOLN Place into the nose as needed.     buPROPion (WELLBUTRIN XL) 150 MG 24 hr tablet Take 150 mg by mouth daily.     cetirizine (ZYRTEC) 10 MG tablet Take 10 mg by mouth daily.     ibuprofen (ADVIL,MOTRIN) 200 MG tablet Take 200 mg by mouth every 6 (six) hours as needed.     lisinopril (PRINIVIL,ZESTRIL) 10 MG tablet Take 10 mg by mouth daily.     metFORMIN (GLUCOPHAGE-XR) 500 MG 24 hr tablet Take 500 mg by mouth daily with breakfast.     Multiple Vitamins-Minerals (CENTRUM SILVER PO) Take by mouth daily.     sildenafil (REVATIO) 20 MG tablet Take 20 mg by mouth as needed.     simvastatin (ZOCOR) 20 MG tablet Take by mouth daily.     Testosterone Cypionate 200 MG/ML KIT Inject into the muscle.     Tetrahydrozoline HCl (VISINE OP) Apply to eye as needed.     tetrahydrozoline-zinc (VISINE-AC) 0.05-0.25 % ophthalmic solution 2 drops 3 (three) times daily  as needed.     Triamcinolone Acetonide (NASACORT AQ NA) Place into the nose.     Cholecalciferol 2000 UNITS CAPS Take by mouth. (Patient not taking: No sig reported)     co-enzyme Q-10 30 MG capsule Take 30 mg by mouth 3 (three) times daily. (Patient not taking: No sig reported)     OMEGA-3 FATTY ACIDS PO Take by mouth. (Patient not taking: No sig reported)     oxyCODONE-acetaminophen (ROXICET) 5-325 MG tablet Take 1 tablet by mouth every 4 (four) hours as needed for severe pain. (Patient not taking: No sig reported) 20 tablet 0   No current facility-administered medications for this visit.      REVIEW OF SYSTEMS (Negative unless checked)  Constitutional: _0 Weight loss  _1 Fever  _2 Chills Cardiac: _3 Chest pain   _4 Chest pressure   _5 Palpitations   _6 Shortness of breath when laying flat   _7 Shortness  of breath at rest   _8 Shortness of breath with exertion. Vascular:  _9 Pain in legs with walking   _10 Pain in legs at rest   _11 Pain in legs when laying flat   _12 Claudication   _13 Pain in feet when walking  _14 Pain in feet at rest  _15 Pain in feet when laying flat   _16 History of DVT   _17 Phlebitis   _18 Swelling in legs   _19 Varicose veins   _20 Non-healing ulcers Pulmonary:   _21 Uses home oxygen   _22 Productive cough   _23 Hemoptysis   _24 Wheeze  _25 COPD   _26 Asthma Neurologic:  _27 Dizziness  _28 Blackouts   _29 Seizures   _30 History of stroke   _31 History of TIA  _32 Aphasia   _33 Temporary blindness   _34 Dysphagia   _35 Weakness or numbness in arms   _36 Weakness or numbness in legs Musculoskeletal:  _37 Arthritis   _38 Joint swelling   _39 Joint pain   _40 Low back pain Hematologic:  _41 Easy bruising  _42 Easy bleeding   _43 Hypercoagulable state   _44 Anemic  _45 Hepatitis Gastrointestinal:  _46 Blood in stool   _47 Vomiting blood  _48 Gastroesophageal reflux/heartburn   _49 Abdominal pain Genitourinary:  _50 Chronic kidney disease   _51 Difficult urination  _52 Frequent urination  _53 Burning with urination   _54 Hematuria Skin:  _55 Rashes   _56 Ulcers   _57 Wounds Psychological:  _58 History of anxiety   _59  History of major depression.    Physical Exam BP (!) 147/71 (BP Location: Right Arm)   Pulse 61   Resp 16   Ht 5' 11.5" (1.816 m)   Wt 246 lb 12.8 oz (111.9 kg)   BMI 33.94 kg/m  Gen:  WD/WN, NAD Head: [place]/AT, No temporalis wasting.  Ear/Nose/Throat: Hearing grossly intact, nares w/o erythema or drainage, oropharynx w/o Erythema/Exudate Eyes: Conjunctiva clear, sclera non-icteric  Neck: trachea midline.  Palpably enlarged lymph nodes are present in the left supraclavicular region and lateral neck.  The largest is roughly golf ball sized are a little larger.  They are not tender to palpation Pulmonary:  Good air movement, respirations not labored, no use of accessory muscles  Cardiac: RRR, no JVD Vascular:  Vessel Right Left  Radial Palpable Palpable                                    Gastrointestinal:. No masses, surgical incisions, or scars. Musculoskeletal: M/S 5/5 throughout.  Extremities without ischemic changes.  No deformity or atrophy.  No significant lower extremity edema. Neurologic: Sensation grossly intact in extremities.  Symmetrical.  Speech is fluent. Motor exam as listed above. Psychiatric: Judgment intact, Mood &  affect appropriate for pt's clinical situation. Dermatologic: No rashes or ulcers noted.  No cellulitis or open wounds.    Radiology CT SOFT TISSUE NECK W CONTRAST  Result Date: 04/18/2021 CLINICAL DATA:  Abnormal neck ultrasound.  Lymph node enlargement EXAM: CT NECK WITH CONTRAST TECHNIQUE: Multidetector CT imaging of the neck was performed using the standard protocol following the bolus administration of intravenous contrast. CONTRAST:  71m OMNIPAQUE IOHEXOL 350 MG/ML SOLN COMPARISON:  None. FINDINGS: Pharynx and larynx: No evidence of mass or thickening of Waldeyer's ring. Salivary glands: Atrophic appearance of submandibular gland symmetrically. No evidence of mass or inflammation Thyroid: Normal Lymph nodes: Cluster of enlarged fairly homogeneous left supraclavicular in lower posterior triangle nodes. The adjacent fat is stranded around the largest nodes, but not to the extent is expected for a primary inflammatory or infectious process, likely lymphatic obstruction. The largest node is measured at up to 4.1 cm, similar to prior ultrasound. No contralateral or generalized adenopathy. Vascular: Unremarkable Limited intracranial: Negative Visualized orbits: Partial coverage is negative Mastoids and visualized paranasal sinuses: Retention cyst appearance along the floor the left maxillary sinus. Skeleton: Left first and second rib pseudoarticulation. No destructive or acute process. Upper chest: Negative IMPRESSION: Re-identified cluster of pathologic left supraclavicular and lower posterior triangle lymph nodes  measuring up to 4.1 cm. Metastatic disease or lymphoma are the primary concerns, recommend histologic correlation. Electronically Signed   By: JJorje GuildM.D.   On: 04/18/2021 07:23   CT CHEST W CONTRAST  Result Date: 04/18/2021 CLINICAL DATA:  Left neck/supraclavicular mass, lymphadenopathy EXAM: CT CHEST WITH CONTRAST TECHNIQUE: Multidetector CT imaging of the chest was performed during intravenous contrast administration. CONTRAST:  773mOMNIPAQUE IOHEXOL 350 MG/ML SOLN COMPARISON:  None. FINDINGS: Cardiovascular: No significant vascular findings. Normal heart size. No pericardial effusion. Mediastinum/Nodes: There is pathologic lower jugular and medial supraclavicular lymphadenopathy with the index lymph node measuring 3.0 x 4.2 cm at axial image # 3/2. No pathologic right supraclavicular, axillary, mediastinal, or hilar adenopathy. The visualized thyroid is unremarkable. The esophagus is unremarkable. Lungs/Pleura: Lungs are clear. No pleural effusion or pneumothorax. Upper Abdomen: Multiple scattered low-attenuation lesions are seen throughout the visualized liver, the largest of which are compatible with simple cysts. There are multiple hypodense lesions throughout the spleen, measuring up to 2.9 x 3.2 cm at axial image # 58/2. These hypoenhancing masses nearly replaces the splenic parenchyma. The spleen, however, does not appear enlarged. Musculoskeletal: Intraosseous lipoma within the sternum. No suspicious lytic or blastic bone lesion. No acute bone abnormality. IMPRESSION: Pathologic adenopathy within the left supraclavicular and lower jugular lymph node groups as well as innumerable hypoenhancing masses throughout the spleen all together most in keeping with a lymphoproliferative process such as lymphoma. PET CT examination may be helpful for staging purposes. Left lower jugular pathologic adenopathy should be easily amenable to ultrasound-guided tissue sampling for further evaluation.  Electronically Signed   By: AsFidela Salisbury.D.   On: 04/18/2021 04:04    Labs Recent Results (from the past 2160 hour(s))  I-STAT creatinine     Status: None   Collection Time: 04/16/21  1:01 PM  Result Value Ref Range   Creatinine, Ser 1.10 0.61 - 1.24 mg/dL    Assessment/Plan:  Neck mass He underwent a CT scan of the neck which I have independently reviewed which shows very large adenopathy in the left neck and the jugular lymph nodes.  I have sent a message to his oncologist who he is scheduled to see next  week.  He likely will need a biopsy of this area but this may be able to be done percutaneously and not require an open surgical incision.  She is going to review the images and let us know if she would like [place] to help in any way.  Hypertension blood pressure control important in reducing the progression of atherosclerotic disease. On appropriate oral medications.   Diabetes mellitus type 2, uncomplicated (HCC) blood glucose control important in reducing the progression of atherosclerotic disease. Also, involved in wound healing. On appropriate medications.   Hyperlipidemia lipid control important in reducing the progression of atherosclerotic disease. Continue statin therapy      Leotis Pain 04/28/2021, 2:08 PM   This note was created with Dragon medical transcription system.  Any errors from dictation are unintentional.

## 2021-04-28 NOTE — Assessment & Plan Note (Signed)
He underwent a CT scan of the neck which I have independently reviewed which shows very large adenopathy in the left neck and the jugular lymph nodes.  I have sent a message to his oncologist who he is scheduled to see next week.  He likely will need a biopsy of this area but this may be able to be done percutaneously and not require an open surgical incision.  She is going to review the images and let us know if she would like [place] to help in any way.

## 2021-04-29 ENCOUNTER — Inpatient Hospital Stay: Payer: 59

## 2021-04-29 ENCOUNTER — Encounter: Payer: Self-pay | Admitting: Oncology

## 2021-04-29 ENCOUNTER — Inpatient Hospital Stay: Payer: 59 | Attending: Oncology | Admitting: Oncology

## 2021-04-29 ENCOUNTER — Encounter: Payer: Self-pay | Admitting: Surgery

## 2021-04-29 ENCOUNTER — Ambulatory Visit (INDEPENDENT_AMBULATORY_CARE_PROVIDER_SITE_OTHER): Payer: 59 | Admitting: Surgery

## 2021-04-29 VITALS — BP 149/81 | HR 72 | Temp 96.5°F | Resp 20 | Wt 246.2 lb

## 2021-04-29 VITALS — BP 134/76 | HR 80 | Temp 98.6°F | Ht 71.0 in | Wt 247.0 lb

## 2021-04-29 DIAGNOSIS — R590 Localized enlarged lymph nodes: Secondary | ICD-10-CM | POA: Insufficient documentation

## 2021-04-29 DIAGNOSIS — D7389 Other diseases of spleen: Secondary | ICD-10-CM

## 2021-04-29 DIAGNOSIS — R221 Localized swelling, mass and lump, neck: Secondary | ICD-10-CM | POA: Insufficient documentation

## 2021-04-29 DIAGNOSIS — B359 Dermatophytosis, unspecified: Secondary | ICD-10-CM | POA: Insufficient documentation

## 2021-04-29 DIAGNOSIS — J309 Allergic rhinitis, unspecified: Secondary | ICD-10-CM | POA: Insufficient documentation

## 2021-04-29 DIAGNOSIS — K649 Unspecified hemorrhoids: Secondary | ICD-10-CM | POA: Insufficient documentation

## 2021-04-29 DIAGNOSIS — H9319 Tinnitus, unspecified ear: Secondary | ICD-10-CM | POA: Insufficient documentation

## 2021-04-29 LAB — CBC WITH DIFFERENTIAL/PLATELET
Abs Immature Granulocytes: 0.04 10*3/uL (ref 0.00–0.07)
Basophils Absolute: 0.1 10*3/uL (ref 0.0–0.1)
Basophils Relative: 1 %
Eosinophils Absolute: 0.2 10*3/uL (ref 0.0–0.5)
Eosinophils Relative: 2 %
HCT: 36 % — ABNORMAL LOW (ref 39.0–52.0)
Hemoglobin: 11.9 g/dL — ABNORMAL LOW (ref 13.0–17.0)
Immature Granulocytes: 0 %
Lymphocytes Relative: 11 %
Lymphs Abs: 1.2 10*3/uL (ref 0.7–4.0)
MCH: 26.5 pg (ref 26.0–34.0)
MCHC: 33.1 g/dL (ref 30.0–36.0)
MCV: 80.2 fL (ref 80.0–100.0)
Monocytes Absolute: 0.6 10*3/uL (ref 0.1–1.0)
Monocytes Relative: 6 %
Neutro Abs: 8.5 10*3/uL — ABNORMAL HIGH (ref 1.7–7.7)
Neutrophils Relative %: 80 %
Platelets: 294 10*3/uL (ref 150–400)
RBC: 4.49 MIL/uL (ref 4.22–5.81)
RDW: 13.8 % (ref 11.5–15.5)
WBC: 10.6 10*3/uL — ABNORMAL HIGH (ref 4.0–10.5)
nRBC: 0 % (ref 0.0–0.2)

## 2021-04-29 LAB — COMPREHENSIVE METABOLIC PANEL
ALT: 46 U/L — ABNORMAL HIGH (ref 0–44)
AST: 30 U/L (ref 15–41)
Albumin: 4.3 g/dL (ref 3.5–5.0)
Alkaline Phosphatase: 83 U/L (ref 38–126)
Anion gap: 7 (ref 5–15)
BUN: 19 mg/dL (ref 6–20)
CO2: 27 mmol/L (ref 22–32)
Calcium: 9.9 mg/dL (ref 8.9–10.3)
Chloride: 101 mmol/L (ref 98–111)
Creatinine, Ser: 1.27 mg/dL — ABNORMAL HIGH (ref 0.61–1.24)
GFR, Estimated: >60 mL/min (ref >60)
Glucose, Bld: 99 mg/dL (ref 70–99)
Potassium: 3.9 mmol/L (ref 3.5–5.1)
Sodium: 135 mmol/L (ref 135–145)
Total Bilirubin: 0.3 mg/dL (ref 0.3–1.2)
Total Protein: 8 g/dL (ref 6.5–8.1)

## 2021-04-29 LAB — PROTIME-INR
INR: 1 (ref 0.8–1.2)
Prothrombin Time: 12.9 seconds (ref 11.4–15.2)

## 2021-04-29 LAB — HEPATITIS B CORE ANTIBODY, TOTAL: Hep B Core Total Ab: NONREACTIVE

## 2021-04-29 LAB — LACTATE DEHYDROGENASE: LDH: 180 U/L (ref 98–192)

## 2021-04-29 LAB — HEPATITIS C ANTIBODY: HCV Ab: NONREACTIVE

## 2021-04-29 LAB — URIC ACID: Uric Acid, Serum: 7 mg/dL (ref 3.7–8.6)

## 2021-04-29 LAB — HEPATITIS B SURFACE ANTIGEN: Hepatitis B Surface Ag: NONREACTIVE

## 2021-04-29 LAB — PHOSPHORUS: Phosphorus: 5.4 mg/dL — ABNORMAL HIGH (ref 2.5–4.6)

## 2021-04-29 LAB — HIV ANTIBODY (ROUTINE TESTING W REFLEX): HIV Screen 4th Generation wRfx: NONREACTIVE

## 2021-04-29 NOTE — H&P (View-Only) (Signed)
Patient ID: Erik Buchanan, male   DOB: [birth year], 60 y.o.   MRN: [id number]  HPI Erik Buchanan is a 60 y.o. male seen in consultation at the request of Dr. Janese Banks for left neck lymphadenopathy.  He states that he felt a lump on his left neck for about 2 months.  Slowly getting bigger.  No fevers, no chills no sweats, no type B symptoms.  No Evidence of dysphagia. Is diabetic but he is able to perform more than 4 METS of activity without any shortness of breath or chest pain. CBC was normal except mild anemia hb 11.9 and increased in Abs neutro, CMP shows slight elevation of the creatinine to 1.27. CT scan of the neck was personally reviewed showing evidence of a left supraclavicular adenopathy measuring up to 4 cm.  He has significant serous regular nodes as well and hypoenhancing masses throughout the spleen.   HPI  Past Medical History:  Diagnosis Date   Colon polyps    Depression    Diabetes mellitus without complication ([place])    Hemorrhoids    Hyperlipidemia    Hypertension    Testicular hypofunction    Tinnitus     Past Surgical History:  Procedure Laterality Date   COLONOSCOPY WITH PROPOFOL N/A 04/07/2015   Procedure: COLONOSCOPY WITH PROPOFOL;  Surgeon: Hulen Luster, MD;  Location: [facility] ENDOSCOPY;  Service: Gastroenterology;  Laterality: N/A;   COLONOSCOPY WITH PROPOFOL N/A 09/01/2020   Procedure: COLONOSCOPY WITH PROPOFOL;  Surgeon: Toledo, Benay Pike, MD;  Location: [facility] ENDOSCOPY;  Service: Gastroenterology;  Laterality: N/A;   EYE SURGERY     cataract    Family History  Problem Relation Age of Onset   Arthritis Mother    Stroke Paternal Aunt     Social History Social History   Tobacco Use   Smoking status: Never   Smokeless tobacco: Never  Substance Use Topics   Alcohol use: Never   Drug use: Never    Allergies  Allergen Reactions   Ultram [Tramadol Hcl] Other (See Comments)    verticgo and passed out    Current Outpatient Medications  Medication  Sig Dispense Refill   amLODipine (NORVASC) 5 MG tablet Take 5 mg by mouth daily.     Azelastine HCl 137 MCG/SPRAY SOLN Place into the nose as needed.     buPROPion (WELLBUTRIN XL) 150 MG 24 hr tablet Take 150 mg by mouth daily.     cetirizine (ZYRTEC) 10 MG tablet Take 10 mg by mouth daily.     ibuprofen (ADVIL,MOTRIN) 200 MG tablet Take 200 mg by mouth every 6 (six) hours as needed.     lisinopril (PRINIVIL,ZESTRIL) 10 MG tablet Take 10 mg by mouth daily.     metFORMIN (GLUCOPHAGE-XR) 500 MG 24 hr tablet Take 500 mg by mouth daily with breakfast.     Multiple Vitamins-Minerals (CENTRUM SILVER PO) Take by mouth daily.     OMEGA-3 FATTY ACIDS PO Take by mouth.     sildenafil (REVATIO) 20 MG tablet Take 20 mg by mouth as needed.     simvastatin (ZOCOR) 20 MG tablet Take by mouth daily.     Testosterone Cypionate 200 MG/ML KIT Inject into the muscle.     Tetrahydrozoline HCl (VISINE OP) Apply to eye as needed.     tetrahydrozoline-zinc (VISINE-AC) 0.05-0.25 % ophthalmic solution 2 drops 3 (three) times daily as needed.     Triamcinolone Acetonide (NASACORT AQ NA) Place into the nose.     No  current facility-administered medications for this visit.     Review of Systems Full ROS  was asked and was negative except for the information on the HPI  Physical Exam Blood pressure 134/76, pulse 80, temperature 98.6 F (37 C), temperature source Oral, height _0  (1.803 m), weight 247 lb (112 kg), SpO2 96 %. CONSTITUTIONAL: NAD EYES: Pupils are equal, round, a Sclera are non-icteric. EARS, NOSE, MOUTH AND THROAT: He is wearing a mask. Hearing is intact to voice. NECK: Evidence of a large left supraclavicular lymphadenopathy.  Trachea is midline.  Thyroid is normal.  No evidence of JVD RESPIRATORY:  Lungs are clear. There is normal respiratory effort, with equal breath sounds bilaterally, and without pathologic use of accessory muscles. CARDIOVASCULAR: Heart is regular without murmurs, gallops,  or rubs. GI: The abdomen is soft, nontender, and nondistended. There are no palpable masses. There is no hepatosplenomegaly. There are normal bowel sounds in all quadrants. GU: Rectal deferred.   MUSCULOSKELETAL: Normal muscle strength and tone. No cyanosis or edema.   SKIN: Turgor is good and there are no pathologic skin lesions or ulcers. NEUROLOGIC: Motor and sensation is grossly normal. Cranial nerves are grossly intact. PSYCH:  Oriented to person, place and time. Affect is normal.  Data Reviewed  I have personally reviewed the patient's imaging, laboratory findings and medical records.    Assessment/Plan 60 year old male with left neck lymphadenopathy and multiple splenic lesions consistent with lymphoproliferative disorder.  He is in need for formal excisional biopsy and I do think the neck is the best option.  This will allow [place] to have enough tissue to process perform an adequate diagnosis.  Discussed with the patient in detail about the procedure.  Risks, benefits and possible medications including but not limited to: Bleeding, nerve injury, seroma formation and pain.  He understands and wishes to proceed.  Extensive counseling provided.  A copy of this report was sent to the referring provider. Have also discussed with him about his postoperative care and recovery. Time spent with the patient was 60 minutes, with more than 50% of the time spent in face-to-face education, counseling and care coordination.     Caroleen Hamman, MD FACS General Surgeon 04/29/2021, 5:27 PM

## 2021-04-29 NOTE — Progress Notes (Signed)
Patient ID: Erik Buchanan, male   DOB: 08/03/1960, 60 y.o.   MRN: [id number]  HPI Erik Buchanan is a 60 y.o. male seen in consultation at the request of Dr. Rao for left neck lymphadenopathy.  He states that he felt a lump on his left neck for about 2 months.  Slowly getting bigger.  No fevers, no chills no sweats, no type B symptoms.  No Evidence of dysphagia. Is diabetic but he is able to perform more than 4 METS of activity without any shortness of breath or chest pain. CBC was normal except mild anemia hb 11.9 and increased in Abs neutro, CMP shows slight elevation of the creatinine to 1.27. CT scan of the neck was personally reviewed showing evidence of a left supraclavicular adenopathy measuring up to 4 cm.  He has significant serous regular nodes as well and hypoenhancing masses throughout the spleen.   HPI  Past Medical History:  Diagnosis Date   Colon polyps    Depression    Diabetes mellitus without complication (HCC)    Hemorrhoids    Hyperlipidemia    Hypertension    Testicular hypofunction    Tinnitus     Past Surgical History:  Procedure Laterality Date   COLONOSCOPY WITH PROPOFOL N/A 04/07/2015   Procedure: COLONOSCOPY WITH PROPOFOL;  Surgeon: Paul Y Oh, MD;  Location: [facility] ENDOSCOPY;  Service: Gastroenterology;  Laterality: N/A;   COLONOSCOPY WITH PROPOFOL N/A 09/01/2020   Procedure: COLONOSCOPY WITH PROPOFOL;  Surgeon: Toledo, Teodoro K, MD;  Location: [facility] ENDOSCOPY;  Service: Gastroenterology;  Laterality: N/A;   EYE SURGERY     cataract    Family History  Problem Relation Age of Onset   Arthritis Mother    Stroke Paternal Aunt     Social History Social History   Tobacco Use   Smoking status: Never   Smokeless tobacco: Never  Substance Use Topics   Alcohol use: Never   Drug use: Never    Allergies  Allergen Reactions   Ultram [Tramadol Hcl] Other (See Comments)    verticgo and passed out    Current Outpatient Medications  Medication  Sig Dispense Refill   amLODipine (NORVASC) 5 MG tablet Take 5 mg by mouth daily.     Azelastine HCl 137 MCG/SPRAY SOLN Place into the nose as needed.     buPROPion (WELLBUTRIN XL) 150 MG 24 hr tablet Take 150 mg by mouth daily.     cetirizine (ZYRTEC) 10 MG tablet Take 10 mg by mouth daily.     ibuprofen (ADVIL,MOTRIN) 200 MG tablet Take 200 mg by mouth every 6 (six) hours as needed.     lisinopril (PRINIVIL,ZESTRIL) 10 MG tablet Take 10 mg by mouth daily.     metFORMIN (GLUCOPHAGE-XR) 500 MG 24 hr tablet Take 500 mg by mouth daily with breakfast.     Multiple Vitamins-Minerals (CENTRUM SILVER PO) Take by mouth daily.     OMEGA-3 FATTY ACIDS PO Take by mouth.     sildenafil (REVATIO) 20 MG tablet Take 20 mg by mouth as needed.     simvastatin (ZOCOR) 20 MG tablet Take by mouth daily.     Testosterone Cypionate 200 MG/ML KIT Inject into the muscle.     Tetrahydrozoline HCl (VISINE OP) Apply to eye as needed.     tetrahydrozoline-zinc (VISINE-AC) 0.05-0.25 % ophthalmic solution 2 drops 3 (three) times daily as needed.     Triamcinolone Acetonide (NASACORT AQ NA) Place into the nose.     No   current facility-administered medications for this visit.     Review of Systems Full ROS  was asked and was negative except for the information on the HPI  Physical Exam Blood pressure 134/76, pulse 80, temperature 98.6 F (37 C), temperature source Oral, height 5' 11" (1.803 m), weight 247 lb (112 kg), SpO2 96 %. CONSTITUTIONAL: NAD EYES: Pupils are equal, round, a Sclera are non-icteric. EARS, NOSE, MOUTH AND THROAT: He is wearing a mask. Hearing is intact to voice. NECK: Evidence of a large left supraclavicular lymphadenopathy.  Trachea is midline.  Thyroid is normal.  No evidence of JVD RESPIRATORY:  Lungs are clear. There is normal respiratory effort, with equal breath sounds bilaterally, and without pathologic use of accessory muscles. CARDIOVASCULAR: Heart is regular without murmurs, gallops,  or rubs. GI: The abdomen is soft, nontender, and nondistended. There are no palpable masses. There is no hepatosplenomegaly. There are normal bowel sounds in all quadrants. GU: Rectal deferred.   MUSCULOSKELETAL: Normal muscle strength and tone. No cyanosis or edema.   SKIN: Turgor is good and there are no pathologic skin lesions or ulcers. NEUROLOGIC: Motor and sensation is grossly normal. Cranial nerves are grossly intact. PSYCH:  Oriented to person, place and time. Affect is normal.  Data Reviewed  I have personally reviewed the patient's imaging, laboratory findings and medical records.    Assessment/Plan 60-year-old male with left neck lymphadenopathy and multiple splenic lesions consistent with lymphoproliferative disorder.  He is in need for formal excisional biopsy and I do think the neck is the best option.  This will allow us to have enough tissue to process perform an adequate diagnosis.  Discussed with the patient in detail about the procedure.  Risks, benefits and possible medications including but not limited to: Bleeding, nerve injury, seroma formation and pain.  He understands and wishes to proceed.  Extensive counseling provided.  A copy of this report was sent to the referring provider. Have also discussed with him about his postoperative care and recovery. Time spent with the patient was 60 minutes, with more than 50% of the time spent in face-to-face education, counseling and care coordination.     Burtis Imhoff, MD FACS General Surgeon 04/29/2021, 5:27 PM    

## 2021-04-29 NOTE — H&P (View-Only) (Signed)
Patient ID: Erik Buchanan, male   DOB: 01/31/1961, 60 y.o.   MRN: [id number]  HPI Erik Buchanan is a 60 y.o. male seen in consultation at the request of Dr. Rao for left neck lymphadenopathy.  He states that he felt a lump on his left neck for about 2 months.  Slowly getting bigger.  No fevers, no chills no sweats, no type B symptoms.  No Evidence of dysphagia. Is diabetic but he is able to perform more than 4 METS of activity without any shortness of breath or chest pain. CBC was normal except mild anemia hb 11.9 and increased in Abs neutro, CMP shows slight elevation of the creatinine to 1.27. CT scan of the neck was personally reviewed showing evidence of a left supraclavicular adenopathy measuring up to 4 cm.  He has significant serous regular nodes as well and hypoenhancing masses throughout the spleen.   HPI  Past Medical History:  Diagnosis Date   Colon polyps    Depression    Diabetes mellitus without complication (HCC)    Hemorrhoids    Hyperlipidemia    Hypertension    Testicular hypofunction    Tinnitus     Past Surgical History:  Procedure Laterality Date   COLONOSCOPY WITH PROPOFOL N/A 04/07/2015   Procedure: COLONOSCOPY WITH PROPOFOL;  Surgeon: Paul Y Oh, MD;  Location: [facility] ENDOSCOPY;  Service: Gastroenterology;  Laterality: N/A;   COLONOSCOPY WITH PROPOFOL N/A 09/01/2020   Procedure: COLONOSCOPY WITH PROPOFOL;  Surgeon: Toledo, Teodoro K, MD;  Location: [facility] ENDOSCOPY;  Service: Gastroenterology;  Laterality: N/A;   EYE SURGERY     cataract    Family History  Problem Relation Age of Onset   Arthritis Mother    Stroke Paternal Aunt     Social History Social History   Tobacco Use   Smoking status: Never   Smokeless tobacco: Never  Substance Use Topics   Alcohol use: Never   Drug use: Never    Allergies  Allergen Reactions   Ultram [Tramadol Hcl] Other (See Comments)    verticgo and passed out    Current Outpatient Medications  Medication  Sig Dispense Refill   amLODipine (NORVASC) 5 MG tablet Take 5 mg by mouth daily.     Azelastine HCl 137 MCG/SPRAY SOLN Place into the nose as needed.     buPROPion (WELLBUTRIN XL) 150 MG 24 hr tablet Take 150 mg by mouth daily.     cetirizine (ZYRTEC) 10 MG tablet Take 10 mg by mouth daily.     ibuprofen (ADVIL,MOTRIN) 200 MG tablet Take 200 mg by mouth every 6 (six) hours as needed.     lisinopril (PRINIVIL,ZESTRIL) 10 MG tablet Take 10 mg by mouth daily.     metFORMIN (GLUCOPHAGE-XR) 500 MG 24 hr tablet Take 500 mg by mouth daily with breakfast.     Multiple Vitamins-Minerals (CENTRUM SILVER PO) Take by mouth daily.     OMEGA-3 FATTY ACIDS PO Take by mouth.     sildenafil (REVATIO) 20 MG tablet Take 20 mg by mouth as needed.     simvastatin (ZOCOR) 20 MG tablet Take by mouth daily.     Testosterone Cypionate 200 MG/ML KIT Inject into the muscle.     Tetrahydrozoline HCl (VISINE OP) Apply to eye as needed.     tetrahydrozoline-zinc (VISINE-AC) 0.05-0.25 % ophthalmic solution 2 drops 3 (three) times daily as needed.     Triamcinolone Acetonide (NASACORT AQ NA) Place into the nose.     No   current facility-administered medications for this visit.     Review of Systems Full ROS  was asked and was negative except for the information on the HPI  Physical Exam Blood pressure 134/76, pulse 80, temperature 98.6 F (37 C), temperature source Oral, height 5' 11" (1.803 m), weight 247 lb (112 kg), SpO2 96 %. CONSTITUTIONAL: NAD EYES: Pupils are equal, round, a Sclera are non-icteric. EARS, NOSE, MOUTH AND THROAT: He is wearing a mask. Hearing is intact to voice. NECK: Evidence of a large left supraclavicular lymphadenopathy.  Trachea is midline.  Thyroid is normal.  No evidence of JVD RESPIRATORY:  Lungs are clear. There is normal respiratory effort, with equal breath sounds bilaterally, and without pathologic use of accessory muscles. CARDIOVASCULAR: Heart is regular without murmurs, gallops,  or rubs. GI: The abdomen is soft, nontender, and nondistended. There are no palpable masses. There is no hepatosplenomegaly. There are normal bowel sounds in all quadrants. GU: Rectal deferred.   MUSCULOSKELETAL: Normal muscle strength and tone. No cyanosis or edema.   SKIN: Turgor is good and there are no pathologic skin lesions or ulcers. NEUROLOGIC: Motor and sensation is grossly normal. Cranial nerves are grossly intact. PSYCH:  Oriented to person, place and time. Affect is normal.  Data Reviewed  I have personally reviewed the patient's imaging, laboratory findings and medical records.    Assessment/Plan 60-year-old male with left neck lymphadenopathy and multiple splenic lesions consistent with lymphoproliferative disorder.  He is in need for formal excisional biopsy and I do think the neck is the best option.  This will allow us to have enough tissue to process perform an adequate diagnosis.  Discussed with the patient in detail about the procedure.  Risks, benefits and possible medications including but not limited to: Bleeding, nerve injury, seroma formation and pain.  He understands and wishes to proceed.  Extensive counseling provided.  A copy of this report was sent to the referring provider. Have also discussed with him about his postoperative care and recovery. Time spent with the patient was 60 minutes, with more than 50% of the time spent in face-to-face education, counseling and care coordination.     Mable Dara, MD FACS General Surgeon 04/29/2021, 5:27 PM    

## 2021-04-29 NOTE — Patient Instructions (Signed)
Our surgery scheduler Pamala Hurry will call you within 24-48 hours to get you scheduled. If you have not heard from her after 48 hours, please call our office. You will not need to get Covid tested before surgery and have the blue sheet available when she calls to write down important information.  If you have any concerns or questions, please feel free to call our office.

## 2021-04-29 NOTE — Progress Notes (Signed)
Hematology/Oncology Consult note [facility] Telephone:([phone] [phone]  Patient Care Team: Maryland Pink, MD as PCP - General (Family Medicine)   Name of the patient: Erik Buchanan  [id number]  1961/05/04    Reason for referral-Dr. Kary Kos   Referring physician-supraclavicular adenopathy  Date of visit: 04/29/21   History of presenting illness-patient is a 60 year old male with a past medical history significant for hypertension and diabetes among other medical problems.  He was noted to have left-sided neck swelling which has been ongoing for the last 4 months but he feels that the swelling has slowly increased in size.  He was seen by Dr. Kary Kos and underwent CT chest and CT soft tissue neck after initial ultrasound of the neck.  CT showed bulky supraclavicular adenopathy with the largest node measuring 4.1 cm CT chest with contrast also showed similar findings and no evidence of right supraclavicular axillary mediastinal or hilar adenopathy.  Lungs were clear.  Multiple hypodense lesions in the spleen which nearly replaces the splenic parenchyma.  Spleen however does not appear enlarged.  Findings concerning for lymphoma.  Patient denies any changes in his appetite or weight.  Denies any drenching night sweats or fever.  ECOG PS- 0  Pain scale- 0   Review of systems- Review of Systems  Constitutional:  Negative for chills, fever, malaise/fatigue and weight loss.  HENT:  Negative for congestion, ear discharge and nosebleeds.        Neck swelling  Eyes:  Negative for blurred vision.  Respiratory:  Negative for cough, hemoptysis, sputum production, shortness of breath and wheezing.   Cardiovascular:  Negative for chest pain, palpitations, orthopnea and claudication.  Gastrointestinal:  Negative for abdominal pain, blood in stool, constipation, diarrhea, heartburn, melena, nausea and vomiting.  Genitourinary:  Negative for dysuria, flank  pain, frequency, hematuria and urgency.  Musculoskeletal:  Negative for back pain, joint pain and myalgias.  Skin:  Negative for rash.  Neurological:  Negative for dizziness, tingling, focal weakness, seizures, weakness and headaches.  Endo/Heme/Allergies:  Does not bruise/bleed easily.  Psychiatric/Behavioral:  Negative for depression and suicidal ideas. The patient does not have insomnia.    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   Anemia 04/28/2021   Neck mass 04/28/2021     Past Medical History:  Diagnosis Date   Colon polyps    Depression    Diabetes mellitus without complication ([place])    Hemorrhoids    Hyperlipidemia    Hypertension    Testicular hypofunction    Tinnitus      Past Surgical History:  Procedure Laterality Date   COLONOSCOPY WITH PROPOFOL N/A 04/07/2015   Procedure: COLONOSCOPY WITH PROPOFOL;  Surgeon: Hulen Luster, MD;  Location: [facility] ENDOSCOPY;  Service: Gastroenterology;  Laterality: N/A;   COLONOSCOPY WITH PROPOFOL N/A 09/01/2020   Procedure: COLONOSCOPY WITH PROPOFOL;  Surgeon: Toledo, Benay Pike, MD;  Location: [facility] ENDOSCOPY;  Service: Gastroenterology;  Laterality: N/A;   EYE SURGERY     cataract    Social History   Socioeconomic History   Marital status: Married    Spouse name: Not on file  Number of children: Not on file   Years of education: Not on file   Highest education level: Not on file  Occupational History   Not on file  Tobacco Use   Smoking status: Never   Smokeless tobacco: Never  Substance and Sexual Activity   Alcohol use: Never   Drug use: Never   Sexual  activity: Not on file  Other Topics Concern   Not on file  Social History Narrative   Not on file   Social Determinants of Health   Financial Resource Strain: Not on file  Food Insecurity: Not on file  Transportation Needs: Not on file  Physical Activity: Not on file  Stress: Not on file  Social Connections: Not on file  Intimate Partner Violence: Not on file     Family History  Problem Relation Age of Onset   Arthritis Mother    Stroke Paternal Aunt      Current Outpatient Medications:    amLODipine (NORVASC) 5 MG tablet, Take 5 mg by mouth daily., Disp: , Rfl:    aspirin EC 81 MG tablet, Take 81 mg by mouth daily. Swallow whole., Disp: , Rfl:    Azelastine HCl 137 MCG/SPRAY SOLN, Place into the nose as needed., Disp: , Rfl:    buPROPion (WELLBUTRIN XL) 150 MG 24 hr tablet, Take 150 mg by mouth daily., Disp: , Rfl:    cetirizine (ZYRTEC) 10 MG tablet, Take 10 mg by mouth daily., Disp: , Rfl:    ibuprofen (ADVIL,MOTRIN) 200 MG tablet, Take 200 mg by mouth every 6 (six) hours as needed., Disp: , Rfl:    lisinopril (PRINIVIL,ZESTRIL) 10 MG tablet, Take 10 mg by mouth daily., Disp: , Rfl:    metFORMIN (GLUCOPHAGE-XR) 500 MG 24 hr tablet, Take 500 mg by mouth daily with breakfast., Disp: , Rfl:    Multiple Vitamins-Minerals (CENTRUM SILVER PO), Take by mouth daily., Disp: , Rfl:    OMEGA-3 FATTY ACIDS PO, Take by mouth., Disp: , Rfl:    sildenafil (REVATIO) 20 MG tablet, Take 20 mg by mouth as needed., Disp: , Rfl:    simvastatin (ZOCOR) 20 MG tablet, Take by mouth daily., Disp: , Rfl:    Testosterone Cypionate 200 MG/ML KIT, Inject into the muscle., Disp: , Rfl:    Tetrahydrozoline HCl (VISINE OP), Apply to eye as needed., Disp: , Rfl:    tetrahydrozoline-zinc (VISINE-AC) 0.05-0.25 % ophthalmic solution, 2 drops 3 (three) times daily as needed., Disp: , Rfl:    Triamcinolone Acetonide (NASACORT AQ NA), Place into the nose., Disp: , Rfl:    Cholecalciferol 2000 UNITS CAPS, Take  by mouth. (Patient not taking: No sig reported), Disp: , Rfl:    co-enzyme Q-10 30 MG capsule, Take 30 mg by mouth 3 (three) times daily. (Patient not taking: No sig reported), Disp: , Rfl:    oxyCODONE-acetaminophen (ROXICET) 5-325 MG tablet, Take 1 tablet by mouth every 4 (four) hours as needed for severe pain. (Patient not taking: No sig reported), Disp: 20 tablet, Rfl: 0   Physical exam:  Vitals:   04/29/21 1341  BP: (!) 149/81  Pulse: 72  Resp: 20  Temp: (!) 96.5 F (35.8 C)  SpO2: 96%  Weight: 246 lb 3.2 oz (111.7 kg)   Physical Exam Constitutional:      General: He is not in acute distress. Cardiovascular:     Rate and Rhythm: Normal rate and regular rhythm.     Heart sounds: Normal heart sounds.  Pulmonary:  Effort: Pulmonary effort is normal.     Breath sounds: Normal breath sounds.  Abdominal:     General: Bowel sounds are normal.     Palpations: Abdomen is soft.     Comments: No palpable hepatosplenomegaly  Lymphadenopathy:     Comments: Bulky palpable left supraclavicular adenopathy which extends from the base of the neck to the supraclavicular region roughly measuring 6 cm.  No palpable right supraclavicular adenopathy.  No palpable axillary mediastinal or inguinal adenopathy.  Skin:    General: Skin is warm and dry.  Neurological:     Mental Status: He is alert and oriented to person, place, and time.       CMP Latest Ref Rng & Units 04/29/2021  Glucose 70 - 99 mg/dL 99  BUN 6 - 20 mg/dL 19  Creatinine 0.61 - 1.24 mg/dL 1.27(H)  Sodium 135 - 145 mmol/L 135  Potassium 3.5 - 5.1 mmol/L 3.9  Chloride 98 - 111 mmol/L 101  CO2 22 - 32 mmol/L 27  Calcium 8.9 - 10.3 mg/dL 9.9  Total Protein 6.5 - 8.1 g/dL 8.0  Total Bilirubin 0.3 - 1.2 mg/dL 0.3  Alkaline Phos 38 - 126 U/L 83  AST 15 - 41 U/L 30  ALT 0 - 44 U/L 46(H)   CBC Latest Ref Rng & Units 04/29/2021  WBC 4.0 - 10.5 K/uL 10.6(H)  Hemoglobin 13.0 - 17.0 g/dL 11.9(L)  Hematocrit 39.0 - 52.0 %  36.0(L)  Platelets 150 - 400 K/uL 294    No images are attached to the encounter.  CT SOFT TISSUE NECK W CONTRAST  Result Date: 04/18/2021 CLINICAL DATA:  Abnormal neck ultrasound.  Lymph node enlargement EXAM: CT NECK WITH CONTRAST TECHNIQUE: Multidetector CT imaging of the neck was performed using the standard protocol following the bolus administration of intravenous contrast. CONTRAST:  23m OMNIPAQUE IOHEXOL 350 MG/ML SOLN COMPARISON:  None. FINDINGS: Pharynx and larynx: No evidence of mass or thickening of Waldeyer's ring. Salivary glands: Atrophic appearance of submandibular gland symmetrically. No evidence of mass or inflammation Thyroid: Normal Lymph nodes: Cluster of enlarged fairly homogeneous left supraclavicular in lower posterior triangle nodes. The adjacent fat is stranded around the largest nodes, but not to the extent is expected for a primary inflammatory or infectious process, likely lymphatic obstruction. The largest node is measured at up to 4.1 cm, similar to prior ultrasound. No contralateral or generalized adenopathy. Vascular: Unremarkable Limited intracranial: Negative Visualized orbits: Partial coverage is negative Mastoids and visualized paranasal sinuses: Retention cyst appearance along the floor the left maxillary sinus. Skeleton: Left first and second rib pseudoarticulation. No destructive or acute process. Upper chest: Negative IMPRESSION: Re-identified cluster of pathologic left supraclavicular and lower posterior triangle lymph nodes measuring up to 4.1 cm. Metastatic disease or lymphoma are the primary concerns, recommend histologic correlation. Electronically Signed   By: JJorje GuildM.D.   On: 04/18/2021 07:23   CT CHEST W CONTRAST  Result Date: 04/18/2021 CLINICAL DATA:  Left neck/supraclavicular mass, lymphadenopathy EXAM: CT CHEST WITH CONTRAST TECHNIQUE: Multidetector CT imaging of the chest was performed during intravenous contrast administration. CONTRAST:   759mOMNIPAQUE IOHEXOL 350 MG/ML SOLN COMPARISON:  None. FINDINGS: Cardiovascular: No significant vascular findings. Normal heart size. No pericardial effusion. Mediastinum/Nodes: There is pathologic lower jugular and medial supraclavicular lymphadenopathy with the index lymph node measuring 3.0 x 4.2 cm at axial image # 3/2. No pathologic right supraclavicular, axillary, mediastinal, or hilar adenopathy. The visualized thyroid is unremarkable. The esophagus is unremarkable. Lungs/Pleura: Lungs  are clear. No pleural effusion or pneumothorax. Upper Abdomen: Multiple scattered low-attenuation lesions are seen throughout the visualized liver, the largest of which are compatible with simple cysts. There are multiple hypodense lesions throughout the spleen, measuring up to 2.9 x 3.2 cm at axial image # 58/2. These hypoenhancing masses nearly replaces the splenic parenchyma. The spleen, however, does not appear enlarged. Musculoskeletal: Intraosseous lipoma within the sternum. No suspicious lytic or blastic bone lesion. No acute bone abnormality. IMPRESSION: Pathologic adenopathy within the left supraclavicular and lower jugular lymph node groups as well as innumerable hypoenhancing masses throughout the spleen all together most in keeping with a lymphoproliferative process such as lymphoma. PET CT examination may be helpful for staging purposes. Left lower jugular pathologic adenopathy should be easily amenable to ultrasound-guided tissue sampling for further evaluation. Electronically Signed   By: Fidela Salisbury M.D.   On: 04/18/2021 04:04    Assessment and plan- Patient is a 60 y.o. male with bulky left supraclavicular adenopathy concerning for lymphoma  Discussed with the patient that CT soft tissue neck as well as CT chest shows bulky left supraclavicular adenopathy as well as hypodense splenic lesions overall concerning for lymphoma.  No other primary site of malignancy was identified.  We have moved up his PET  scan to early next week.  I will obtain baseline labs including CBC with differential CMP LDH HIV hepatitis B hepatitis C, PT/INR as well as tumor lysis labs including uric acid and phosphorus today.  I did reach out to Dr. Dahlia Byes who has kindly agreed to see the patient this evening to decide if excisional supraclavicular lymph node biopsy is an option which I would prefer as compared to core needle biopsy especially with diagnosis of lymphoma.  Discussed with the patient that if this turns out to be a lymphoma patient would likely need systemic chemotherapy as well as bone marrow biopsy which I will discuss with him in greater detail after pathology is back.  Patient and his wife had multiple insightful questions and they were all answered to their satisfaction   Thank you for this kind referral and the opportunity to participate in the care of this patient   Visit Diagnosis 1. Supraclavicular adenopathy   2. Neck mass     Dr. Randa Evens, MD, MPH [facility] [phone] 04/29/2021

## 2021-04-30 ENCOUNTER — Telehealth: Payer: Self-pay | Admitting: Surgery

## 2021-04-30 NOTE — Telephone Encounter (Signed)
Patient has been advised of Pre-Admission date/time, COVID Testing date and Surgery date.  Surgery Date: 05/07/21 Preadmission Testing Date: 05/04/21 (phone 1p-5p) Covid Testing Date: Not needed.     Patient has been made aware to call [phone], between 1-3:00pm the day before surgery, to find out what time to arrive for surgery.

## 2021-05-01 LAB — GLUCOSE 6 PHOSPHATE DEHYDROGENASE
G6PDH: 10.6 U/g{Hb} (ref 5.5–14.2)
Hemoglobin: 11.7 g/dL — ABNORMAL LOW (ref 13.0–17.7)

## 2021-05-04 ENCOUNTER — Other Ambulatory Visit
Admission: RE | Admit: 2021-05-04 | Discharge: 2021-05-04 | Disposition: A | Discharge status: Home / Self Care | Payer: 59 | Source: Ambulatory Visit | Attending: Surgery | Admitting: Surgery

## 2021-05-04 ENCOUNTER — Other Ambulatory Visit: Payer: Self-pay

## 2021-05-04 HISTORY — DX: Anemia, unspecified: D64.9

## 2021-05-04 HISTORY — DX: Pneumonia, unspecified organism: J18.9

## 2021-05-04 HISTORY — DX: Gastro-esophageal reflux disease without esophagitis: K21.9

## 2021-05-04 NOTE — Patient Instructions (Addendum)
Your procedure is scheduled on: 05/07/21 - Thursday Report to the Registration Desk on the 1st floor of the [facility]. To find out your arrival time, please call [phone] between 1PM - 3PM on: 05/06/21 - Wednesday  REMEMBER: Instructions that are not followed completely may result in serious medical risk, up to and including death; or upon the discretion of your surgeon and anesthesiologist your surgery may need to be rescheduled.  Do not eat food after midnight the night before surgery.  No gum chewing, lozengers or hard candies.  You may however, drink CLEAR liquids up to 2 hours before you are scheduled to arrive for your surgery. Do not drink anything within 2 hours of your scheduled arrival time.  Clear liquids include: - water   Type 1 and Type 2 diabetics should only drink water.   TAKE THESE MEDICATIONS THE MORNING OF SURGERY WITH A SIP OF WATER:  - amLODipine (NORVASC) 5 MG tablet, Take 7.5 mg by mouth  - buPROPion (WELLBUTRIN XL) 150 MG 24 hr tablet - sodium chloride (OCEAN) 0.65 % SOLN nasal spray    Do Not Take on 10/25, 10/26, and do not take the day of surgery - MetFORMIN (GLUCOPHAGE-XR) 500 MG 24 hr tablet   One week prior to surgery: Stop Anti-inflammatories (NSAIDS) such as Advil, Aleve, Ibuprofen, Motrin, Naproxen, Naprosyn and Aspirin based products such as Excedrin, Goodys Powder, BC Powder.  Stop ANY OVER THE COUNTER supplements until after surgery.  You may however, continue to take Tylenol if needed for pain up until the day of surgery.  No Alcohol for 24 hours before or after surgery.  No Smoking including e-cigarettes for 24 hours prior to surgery.  No chewable tobacco products for at least 6 hours prior to surgery.  No nicotine patches on the day of surgery.  Do not use any "recreational" drugs for at least a week prior to your surgery.  Please be advised that the combination of cocaine and anesthesia may have negative outcomes, up to and  including death. If you test positive for cocaine, your surgery will be cancelled.  On the morning of surgery brush your teeth with toothpaste and water, you may rinse your mouth with mouthwash if you wish. Do not swallow any toothpaste or mouthwash.  Do not wear jewelry, make-up, hairpins, clips or nail polish.  Do not wear lotions, powders, or perfumes.   Do not shave body from the neck down 48 hours prior to surgery just in case you cut yourself which could leave a site for infection.  Also, freshly shaved skin may become irritated if using the CHG soap.  Contact lenses, hearing aids and dentures may not be worn into surgery.  Do not bring valuables to the hospital. [facility] is not responsible for any missing/lost belongings or valuables.   Notify your doctor if there is any change in your medical condition (cold, fever, infection).  Wear comfortable clothing (specific to your surgery type) to the hospital.  After surgery, you can help prevent lung complications by doing breathing exercises.  Take deep breaths and cough every 1-2 hours. Your doctor may order a device called an Incentive Spirometer to help you take deep breaths. When coughing or sneezing, hold a pillow firmly against your incision with both hands. This is called "splinting." Doing this helps protect your incision. It also decreases belly discomfort.  If you are being admitted to the hospital overnight, leave your suitcase in the car. After surgery it may be  brought to your room.  If you are being discharged the day of surgery, you will not be allowed to drive home. You will need a responsible adult (18 years or older) to drive you home and stay with you that night.   If you are taking public transportation, you will need to have a responsible adult (18 years or older) with you. Please confirm with your physician that it is acceptable to use public transportation.   Please call the [facility]  at [phone] if you have any questions about these instructions.  Surgery Visitation Policy:  Patients undergoing a surgery or procedure may have one family member or support person with them as long as that person is not COVID-19 positive or experiencing its symptoms.  That person may remain in the waiting area during the procedure and may rotate out with other people.  Inpatient Visitation:    Visiting hours are 7 a.m. to 8 p.m. Up to two visitors ages 16+ are allowed at one time in a patient room. The visitors may rotate out with other people during the day. Visitors must check out when they leave, or other visitors will not be allowed. One designated support person may remain overnight. The visitor must pass COVID-19 screenings, use hand sanitizer when entering and exiting the patient's room and wear a mask at all times, including in the patient's room. Patients must also wear a mask when staff or their visitor are in the room. Masking is required regardless of vaccination status.

## 2021-05-05 ENCOUNTER — Encounter: Payer: 59 | Admitting: Oncology

## 2021-05-05 ENCOUNTER — Other Ambulatory Visit: Payer: 59

## 2021-05-06 ENCOUNTER — Other Ambulatory Visit: Payer: Self-pay

## 2021-05-06 ENCOUNTER — Ambulatory Visit
Admission: RE | Admit: 2021-05-06 | Discharge: 2021-05-06 | Disposition: A | Discharge status: Home / Self Care | Payer: 59 | Source: Ambulatory Visit | Attending: Family Medicine | Admitting: Family Medicine

## 2021-05-06 DIAGNOSIS — D759 Disease of blood and blood-forming organs, unspecified: Secondary | ICD-10-CM | POA: Insufficient documentation

## 2021-05-06 DIAGNOSIS — R221 Localized swelling, mass and lump, neck: Secondary | ICD-10-CM | POA: Diagnosis not present

## 2021-05-06 DIAGNOSIS — R599 Enlarged lymph nodes, unspecified: Secondary | ICD-10-CM

## 2021-05-06 DIAGNOSIS — R9389 Abnormal findings on diagnostic imaging of other specified body structures: Secondary | ICD-10-CM | POA: Insufficient documentation

## 2021-05-06 LAB — GLUCOSE, CAPILLARY: Glucose-Capillary: 88 mg/dL (ref 70–99)

## 2021-05-06 MED ORDER — SODIUM CHLORIDE 0.9 % IV SOLN: INTRAVENOUS | Status: DC

## 2021-05-06 MED ORDER — CELECOXIB 200 MG PO CAPS
200.0000 mg | ORAL_CAPSULE | ORAL | Status: AC
  Administered 2021-05-07: 200 mg via ORAL

## 2021-05-06 MED ORDER — ACETAMINOPHEN 500 MG PO TABS: 1000.0000 mg | ORAL_TABLET | ORAL | Status: AC

## 2021-05-06 MED ORDER — FLUDEOXYGLUCOSE F - 18 (FDG) INJECTION
12.8000 | Freq: Once | INTRAVENOUS | Status: AC | PRN
  Administered 2021-05-06: 12.9 via INTRAVENOUS

## 2021-05-06 MED ORDER — CHLORHEXIDINE GLUCONATE CLOTH 2 % EX PADS: 6.0000 | MEDICATED_PAD | Freq: Once | CUTANEOUS | Status: DC

## 2021-05-06 MED ORDER — FAMOTIDINE 20 MG PO TABS: 20.0000 mg | ORAL_TABLET | Freq: Once | ORAL | Status: AC

## 2021-05-06 MED ORDER — GABAPENTIN 300 MG PO CAPS
300.0000 mg | ORAL_CAPSULE | ORAL | Status: AC
  Administered 2021-05-07: 300 mg via ORAL

## 2021-05-06 MED ORDER — CHLORHEXIDINE GLUCONATE 0.12 % MT SOLN: 15.0000 mL | Freq: Once | OROMUCOSAL | Status: AC

## 2021-05-06 MED ORDER — ORAL CARE MOUTH RINSE: 15.0000 mL | Freq: Once | OROMUCOSAL | Status: AC

## 2021-05-06 MED ORDER — CEFAZOLIN SODIUM-DEXTROSE 2-4 GM/100ML-% IV SOLN
2.0000 g | INTRAVENOUS | Status: AC
  Administered 2021-05-07: 2 g via INTRAVENOUS

## 2021-05-07 ENCOUNTER — Other Ambulatory Visit: Payer: Self-pay

## 2021-05-07 ENCOUNTER — Encounter: Payer: Self-pay | Admitting: Surgery

## 2021-05-07 ENCOUNTER — Ambulatory Visit: Payer: 59 | Admitting: Anesthesiology

## 2021-05-07 ENCOUNTER — Encounter
Admission: RE | Disposition: A | Discharge status: Home / Self Care | Payer: Self-pay | Source: Home / Self Care | Attending: Surgery

## 2021-05-07 ENCOUNTER — Ambulatory Visit
Admission: RE | Admit: 2021-05-07 | Discharge: 2021-05-07 | Disposition: A | Discharge status: Home / Self Care | Payer: 59 | Attending: Surgery | Admitting: Surgery

## 2021-05-07 DIAGNOSIS — Z7984 Long term (current) use of oral hypoglycemic drugs: Secondary | ICD-10-CM | POA: Diagnosis not present

## 2021-05-07 DIAGNOSIS — E119 Type 2 diabetes mellitus without complications: Secondary | ICD-10-CM | POA: Insufficient documentation

## 2021-05-07 DIAGNOSIS — I10 Essential (primary) hypertension: Secondary | ICD-10-CM | POA: Insufficient documentation

## 2021-05-07 DIAGNOSIS — Z888 Allergy status to other drugs, medicaments and biological substances status: Secondary | ICD-10-CM | POA: Insufficient documentation

## 2021-05-07 DIAGNOSIS — Z79899 Other long term (current) drug therapy: Secondary | ICD-10-CM | POA: Diagnosis not present

## 2021-05-07 DIAGNOSIS — D649 Anemia, unspecified: Secondary | ICD-10-CM | POA: Insufficient documentation

## 2021-05-07 DIAGNOSIS — C8191 Hodgkin lymphoma, unspecified, lymph nodes of head, face, and neck: Secondary | ICD-10-CM | POA: Insufficient documentation

## 2021-05-07 DIAGNOSIS — Z01810 Encounter for preprocedural cardiovascular examination: Secondary | ICD-10-CM | POA: Diagnosis not present

## 2021-05-07 DIAGNOSIS — R590 Localized enlarged lymph nodes: Secondary | ICD-10-CM | POA: Diagnosis present

## 2021-05-07 DIAGNOSIS — R221 Localized swelling, mass and lump, neck: Secondary | ICD-10-CM

## 2021-05-07 HISTORY — PX: MASS BIOPSY: SHX5445

## 2021-05-07 LAB — GLUCOSE, CAPILLARY
Glucose-Capillary: 81 mg/dL (ref 70–99)
Glucose-Capillary: 117 mg/dL — ABNORMAL HIGH (ref 70–99)

## 2021-05-07 SURGERY — BIOPSY, MASS, NECK
Anesthesia: General | Site: Neck | Laterality: Left

## 2021-05-07 MED ORDER — PROPOFOL 10 MG/ML IV BOLUS
INTRAVENOUS | Status: DC | PRN
  Administered 2021-05-07: 200 mg via INTRAVENOUS

## 2021-05-07 MED ORDER — MIDAZOLAM HCL 2 MG/2ML IJ SOLN
INTRAMUSCULAR | Status: AC
  Filled 2021-05-07: qty 2

## 2021-05-07 MED ORDER — ONDANSETRON HCL 4 MG/2ML IJ SOLN
4.0000 mg | INTRAMUSCULAR | Status: AC
  Administered 2021-05-07: 4 mg via INTRAVENOUS

## 2021-05-07 MED ORDER — MIDAZOLAM HCL 2 MG/2ML IJ SOLN
INTRAMUSCULAR | Status: DC | PRN
  Administered 2021-05-07: 2 mg via INTRAVENOUS

## 2021-05-07 MED ORDER — CHLORHEXIDINE GLUCONATE 0.12 % MT SOLN
OROMUCOSAL | Status: AC
  Administered 2021-05-07: 15 mL via OROMUCOSAL
  Filled 2021-05-07: qty 15

## 2021-05-07 MED ORDER — BUPIVACAINE-EPINEPHRINE (PF) 0.25% -1:200000 IJ SOLN
INTRAMUSCULAR | Status: DC | PRN
  Administered 2021-05-07: 20 mL via PERINEURAL

## 2021-05-07 MED ORDER — OXYCODONE HCL 5 MG PO TABS: 5.0000 mg | ORAL_TABLET | Freq: Once | ORAL | Status: DC | PRN

## 2021-05-07 MED ORDER — FENTANYL CITRATE (PF) 100 MCG/2ML IJ SOLN
INTRAMUSCULAR | Status: AC
  Filled 2021-05-07: qty 2

## 2021-05-07 MED ORDER — PROMETHAZINE HCL 25 MG/ML IJ SOLN
INTRAMUSCULAR | Status: AC
  Administered 2021-05-07: 6.25 mg
  Filled 2021-05-07: qty 1

## 2021-05-07 MED ORDER — GABAPENTIN 300 MG PO CAPS
ORAL_CAPSULE | ORAL | Status: AC
  Filled 2021-05-07: qty 1

## 2021-05-07 MED ORDER — 0.9 % SODIUM CHLORIDE (POUR BTL) OPTIME
TOPICAL | Status: DC | PRN
  Administered 2021-05-07: 500 mL

## 2021-05-07 MED ORDER — SODIUM CHLORIDE 0.9 % IV SOLN
6.2500 mg | Freq: Once | INTRAVENOUS | Status: AC
  Filled 2021-05-07: qty 0.25

## 2021-05-07 MED ORDER — BUPIVACAINE-EPINEPHRINE (PF) 0.25% -1:200000 IJ SOLN
INTRAMUSCULAR | Status: AC
  Filled 2021-05-07: qty 30

## 2021-05-07 MED ORDER — OXYCODONE HCL 5 MG/5ML PO SOLN
5.0000 mg | Freq: Once | ORAL | Status: DC | PRN
Start: 2021-05-07 — End: 2021-05-08

## 2021-05-07 MED ORDER — SUGAMMADEX SODIUM 200 MG/2ML IV SOLN
INTRAVENOUS | Status: DC | PRN
  Administered 2021-05-07: 200 mg via INTRAVENOUS

## 2021-05-07 MED ORDER — LIDOCAINE HCL (CARDIAC) PF 100 MG/5ML IV SOSY
PREFILLED_SYRINGE | INTRAVENOUS | Status: DC | PRN
  Administered 2021-05-07: 100 mg via INTRAVENOUS

## 2021-05-07 MED ORDER — ONDANSETRON HCL 4 MG/2ML IJ SOLN
INTRAMUSCULAR | Status: DC | PRN
  Administered 2021-05-07: 4 mg via INTRAVENOUS

## 2021-05-07 MED ORDER — DEXAMETHASONE SODIUM PHOSPHATE 10 MG/ML IJ SOLN
INTRAMUSCULAR | Status: DC | PRN
  Administered 2021-05-07: 10 mg via INTRAVENOUS

## 2021-05-07 MED ORDER — ROCURONIUM BROMIDE 100 MG/10ML IV SOLN
INTRAVENOUS | Status: DC | PRN
  Administered 2021-05-07: 40 mg via INTRAVENOUS
  Administered 2021-05-07: 5 mg via INTRAVENOUS

## 2021-05-07 MED ORDER — CELECOXIB 200 MG PO CAPS
ORAL_CAPSULE | ORAL | Status: AC
  Filled 2021-05-07: qty 1

## 2021-05-07 MED ORDER — FAMOTIDINE 20 MG PO TABS
ORAL_TABLET | ORAL | Status: AC
  Administered 2021-05-07: 20 mg via ORAL
  Filled 2021-05-07: qty 1

## 2021-05-07 MED ORDER — ONDANSETRON HCL 4 MG/2ML IJ SOLN
INTRAMUSCULAR | Status: AC
  Filled 2021-05-07: qty 2

## 2021-05-07 MED ORDER — EPHEDRINE SULFATE 50 MG/ML IJ SOLN
INTRAMUSCULAR | Status: DC | PRN
  Administered 2021-05-07: 10 mg via INTRAVENOUS
  Administered 2021-05-07: 5 mg via INTRAVENOUS

## 2021-05-07 MED ORDER — SUCCINYLCHOLINE CHLORIDE 200 MG/10ML IV SOSY
PREFILLED_SYRINGE | INTRAVENOUS | Status: DC | PRN
  Administered 2021-05-07: 120 mg via INTRAVENOUS

## 2021-05-07 MED ORDER — FENTANYL CITRATE (PF) 100 MCG/2ML IJ SOLN: 25.0000 ug | INTRAMUSCULAR | Status: DC | PRN

## 2021-05-07 MED ORDER — FENTANYL CITRATE (PF) 100 MCG/2ML IJ SOLN
INTRAMUSCULAR | Status: DC | PRN
  Administered 2021-05-07 (×2): 50 ug via INTRAVENOUS

## 2021-05-07 MED ORDER — HYDROCODONE-ACETAMINOPHEN 5-325 MG PO TABS: 1.0000 | ORAL_TABLET | Freq: Four times a day (QID) | ORAL | 0 refills | Status: DC | PRN

## 2021-05-07 MED ORDER — ACETAMINOPHEN 500 MG PO TABS
ORAL_TABLET | ORAL | Status: AC
  Administered 2021-05-07: 1000 mg via ORAL
  Filled 2021-05-07: qty 2

## 2021-05-07 MED ORDER — CEFAZOLIN SODIUM-DEXTROSE 2-4 GM/100ML-% IV SOLN
INTRAVENOUS | Status: AC
  Filled 2021-05-07: qty 100

## 2021-05-07 SURGICAL SUPPLY — 30 items
APPLIER CLIP 9.375 SM OPEN (CLIP) ×2
BLADE CLIPPER SURG (BLADE) ×2 IMPLANT
BLADE SURG 15 STRL LF DISP TIS (BLADE) ×1 IMPLANT
BLADE SURG 15 STRL SS (BLADE) ×1
CHLORAPREP W/TINT 26 (MISCELLANEOUS) ×2 IMPLANT
CLIP APPLIE 9.375 SM OPEN (CLIP) ×1 IMPLANT
DERMABOND ADVANCED (GAUZE/BANDAGES/DRESSINGS) ×1
DERMABOND ADVANCED .7 DNX12 (GAUZE/BANDAGES/DRESSINGS) ×1 IMPLANT
DRAPE LAPAROTOMY 77X122 PED (DRAPES) ×2 IMPLANT
ELECT REM PT RETURN 9FT ADLT (ELECTROSURGICAL) ×2
ELECTRODE REM PT RTRN 9FT ADLT (ELECTROSURGICAL) ×1 IMPLANT
GAUZE 4X4 16PLY ~~LOC~~+RFID DBL (SPONGE) ×2 IMPLANT
GLOVE SURG ENC MOIS LTX SZ7 (GLOVE) ×2 IMPLANT
GOWN STRL REUS W/ TWL LRG LVL3 (GOWN DISPOSABLE) ×3 IMPLANT
GOWN STRL REUS W/TWL LRG LVL3 (GOWN DISPOSABLE) ×3
MANIFOLD NEPTUNE II (INSTRUMENTS) ×2 IMPLANT
NEEDLE HYPO 22GX1.5 SAFETY (NEEDLE) ×2 IMPLANT
NS IRRIG 500ML POUR BTL (IV SOLUTION) ×2 IMPLANT
PACK BASIN MINOR ARMC (MISCELLANEOUS) ×2 IMPLANT
PENCIL ELECTRO HAND CTR (MISCELLANEOUS) ×2 IMPLANT
SPONGE KITTNER 5P (MISCELLANEOUS) ×2 IMPLANT
SPONGE T-LAP 18X18 ~~LOC~~+RFID (SPONGE) ×2 IMPLANT
SUT MNCRL 4-0 (SUTURE) ×1
SUT MNCRL 4-0 27XMFL (SUTURE) ×1
SUT VIC AB 2-0 SH 27 (SUTURE) ×1
SUT VIC AB 2-0 SH 27XBRD (SUTURE) ×1 IMPLANT
SUTURE MNCRL 4-0 27XMF (SUTURE) ×1 IMPLANT
SYR 20ML LL LF (SYRINGE) ×2 IMPLANT
SYR BULB EAR ULCER 3OZ GRN STR (SYRINGE) ×2 IMPLANT
WATER STERILE IRR 500ML POUR (IV SOLUTION) ×2 IMPLANT

## 2021-05-07 NOTE — Discharge Instructions (Signed)
AMBULATORY SURGERY  ?DISCHARGE INSTRUCTIONS ? ? ?The drugs that you were given will stay in your system until tomorrow so for the next 24 hours you should not: ? ?Drive an automobile ?Make any legal decisions ?Drink any alcoholic beverage ? ? ?You may resume regular meals tomorrow.  Today it is better to start with liquids and gradually work up to solid foods. ? ?You may eat anything you prefer, but it is better to start with liquids, then soup and crackers, and gradually work up to solid foods. ? ? ?Please notify your doctor immediately if you have any unusual bleeding, trouble breathing, redness and pain at the surgery site, drainage, fever, or pain not relieved by medication. ? ? ? ?Additional Instructions: ? ? ? ?Please contact your physician with any problems or Same Day Surgery at [phone], Monday through Friday 6 am to 4 pm, or [place] [facility] [place] Main number at [phone].  ?

## 2021-05-07 NOTE — Transfer of Care (Signed)
Immediate Anesthesia Transfer of Care Note  Patient: Erik Buchanan  Procedure(s) Performed: NECK MASS BIOPSY, (Left: Neck)  Patient Location: PACU  Anesthesia Type:General  Level of Consciousness: sedated  Airway & Oxygen Therapy: Patient Spontanous Breathing and Patient connected to face mask oxygen  Post-op Assessment: Report given to RN and Post -op Vital signs reviewed and stable  Post vital signs: Reviewed and stable  Last Vitals:  Vitals Value Taken Time  BP 117/64   Temp    Pulse 65 05/07/21 1732  Resp 15 05/07/21 1732  SpO2 98 % 05/07/21 1732  Vitals shown include unvalidated device data.  Last Pain:  Vitals:   05/07/21 1437  TempSrc: Temporal  PainSc: 0-No pain         Complications: No notable events documented.

## 2021-05-07 NOTE — Anesthesia Procedure Notes (Signed)
Procedure Name: Intubation Date/Time: 05/07/2021 4:24 PM Performed by: Aline Brochure, CRNA Pre-anesthesia Checklist: Patient identified, Patient being monitored, Timeout performed, Emergency Drugs available and Suction available Patient Re-evaluated:Patient Re-evaluated prior to induction Oxygen Delivery Method: Circle system utilized Preoxygenation: Pre-oxygenation with 100% oxygen Induction Type: IV induction Ventilation: Mask ventilation without difficulty Laryngoscope Size: 4 and McGraph Grade View: Grade I Tube type: Oral Tube size: 7.0 mm Number of attempts: 1 Airway Equipment and Method: Stylet and Video-laryngoscopy Placement Confirmation: ETT inserted through vocal cords under direct vision, positive ETCO2 and breath sounds checked- equal and bilateral Secured at: 24 cm Tube secured with: Tape Dental Injury: Teeth and Oropharynx as per pre-operative assessment

## 2021-05-07 NOTE — Progress Notes (Signed)
Patient c/o nausea since arrival to PACU. Verbal order for phenerghan IV 6.25 mg ordered and given to patient with IVF NS infusing.

## 2021-05-07 NOTE — Anesthesia Postprocedure Evaluation (Signed)
Anesthesia Post Note  Patient: Erik Buchanan  Procedure(s) Performed: NECK MASS BIOPSY, (Left: Neck)  Patient location during evaluation: PACU Anesthesia Type: General Level of consciousness: awake and alert Pain management: pain level controlled Vital Signs Assessment: post-procedure vital signs reviewed and stable Respiratory status: spontaneous breathing, nonlabored ventilation, respiratory function stable and patient connected to nasal cannula oxygen Cardiovascular status: blood pressure returned to baseline and stable Postop Assessment: no apparent nausea or vomiting Anesthetic complications: no   No notable events documented.   Last Vitals:  Vitals:   05/07/21 1754 05/07/21 1800  BP:  135/78  Pulse: 77 65  Resp: 18 16  Temp:    SpO2: 96% 94%    Last Pain:  Vitals:   05/07/21 1800  TempSrc:   PainSc: 2                  Precious Haws [name]

## 2021-05-07 NOTE — Anesthesia Preprocedure Evaluation (Signed)
Anesthesia Evaluation  Patient identified by MRN, date of birth, ID band Patient awake    Reviewed: Allergy & Precautions, NPO status , Patient's Chart, lab work & pertinent test results  History of Anesthesia Complications Negative for: history of anesthetic complications  Airway Mallampati: III  TM Distance: <3 FB Neck ROM: full    Dental  (+) Chipped   Pulmonary neg pulmonary ROS, neg shortness of breath,    Pulmonary exam normal        Cardiovascular Exercise Tolerance: Good hypertension, (-) angina(-) Past MI and (-) DOE negative cardio ROS Normal cardiovascular exam     Neuro/Psych PSYCHIATRIC DISORDERS negative neurological ROS     GI/Hepatic negative GI ROS, Neg liver ROS, GERD  Medicated and Controlled,  Endo/Other  diabetes, Type 2  Renal/GU      Musculoskeletal   Abdominal   Peds  Hematology negative hematology ROS (+)   Anesthesia Other Findings Past Medical History: No date: Anemia No date: Colon polyps No date: Depression No date: Diabetes mellitus without complication (HCC) No date: GERD (gastroesophageal reflux disease) No date: Hemorrhoids No date: Hyperlipidemia No date: Hypertension No date: Pneumonia 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 No date: Tinnitus  Past Surgical History: 04/07/2015: COLONOSCOPY WITH PROPOFOL; N/A     Comment:  Procedure: COLONOSCOPY WITH PROPOFOL;  Surgeon: Hulen Luster, MD;  Location: [facility] ENDOSCOPY;  Service:               Gastroenterology;  Laterality: N/A; 09/01/2020: COLONOSCOPY WITH PROPOFOL; N/A     Comment:  Procedure: COLONOSCOPY WITH PROPOFOL;  Surgeon: [place],               Benay Pike, MD;  Location: [facility] ENDOSCOPY;  Service:               Gastroenterology;  Laterality: N/A; No date: EYE SURGERY     Comment:  cataract     Reproductive/Obstetrics negative OB ROS                             Anesthesia  Physical Anesthesia Plan  ASA: 3  Anesthesia Plan: General ETT   Post-op Pain Management:    Induction: Intravenous  PONV Risk Score and Plan: Ondansetron, Dexamethasone, Midazolam and Treatment may vary due to age or medical condition  Airway Management Planned: Oral ETT  Additional Equipment:   Intra-op Plan:   Post-operative Plan: Extubation in OR  Informed Consent: I have reviewed the patients History and Physical, chart, labs and discussed the procedure including the risks, benefits and alternatives for the proposed anesthesia with the patient or authorized representative who has indicated his/her understanding and acceptance.     Dental Advisory Given  Plan Discussed with: Anesthesiologist, CRNA and Surgeon  Anesthesia Plan Comments: (Patient consented for risks of anesthesia including but not limited to:  - adverse reactions to medications - damage to eyes, teeth, lips or other oral mucosa - nerve damage due to positioning  - sore throat or hoarseness - Damage to heart, brain, nerves, lungs, other parts of body or loss of life  Patient voiced understanding.)        Anesthesia Quick Evaluation

## 2021-05-07 NOTE — Op Note (Signed)
PROCEDURES: 1. Excision of 6 cms deep Left neck mass Level Va  Pre-operative Diagnosis: Neck mass concerning for lymphoma  Post-operative Diagnosis: same  Surgeon: Marjory Lies [place]   Anesthesia: General endotracheal anesthesia  ASA Class: 2   Surgeon: Caroleen Hamman , MD FACS  Anesthesia: Gen. with endotracheal tube   Findings: Large Neck mass with additional smaller Lymphoadenopathy lateral neck  Estimated Blood Loss: 10cc                Specimens: Neck mass sent fresh lymphoma w/u       Complications: none               Condition: stable  Procedure Details  The patient was seen again in the Holding Room. The benefits, complications, treatment options, and expected outcomes were discussed with the patient. The risks of bleeding, infection, recurrence of symptoms, failure to resolve symptoms,  Nerve injury any of which could require further surgery were reviewed with the patient.   The patient was taken to Operating Room, identified as Erik Buchanan and the procedure verified.  A Time Out was held and the above information confirmed. Prior to the induction of general anesthesia, antibiotic prophylaxis was administered. VTE prophylaxis was in place. General endotracheal anesthesia was then administered and tolerated well. After the induction, the Neck was prepped with Chloraprep and draped in the sterile fashion. The patient was positioned in the supine position with extension of the neck.  The mass was located just lateral to the sternocleidomastoid muscle on the left side.  Transverse incision was created with a 15 blade knife and electrocautery was used to dissect through subcutaneous tissue.  The platysma was divided in the standard fashion.  The sternocleidomastoid muscle was identified and had to be retracted medially.  We entered the fascia and were able to identify the mass.  Circumferential dissection was performed to avoid any injury to pertinent nerves.  There was no  evidence of any vascular injuries.  Using multiple clips we were able to clip the pedicle of the mass as well as multiple lymphatic channels.  This was performed circumferentially.  Were able to excise the mass and sent it for permanent pathology.  We checked hemostasis and was excellent.  We ask anesthesia to perform Valsalva maneuver and there was no evidence of any bleeding.  The platysma was closed with a running 3-0 Vicryl and the skin was closed with a 4-0 Monocryl in a subcuticular fashion.  Dermabond was used to coat  the skin incision. Needle and laparotomy count were correct and there were no immediate complications  Caroleen Hamman, MD, FACS

## 2021-05-07 NOTE — Interval H&P Note (Signed)
History and Physical Interval Note:  05/07/2021 3:57 PM  Erik Buchanan  has presented today for surgery, with the diagnosis of neck mass.  The various methods of treatment have been discussed with the patient and family. After consideration of risks, benefits and other options for treatment, the patient has consented to  Procedure(s) with comments: NECK MASS BIOPSY, excisional left neck mass with RNFA to assist (Left) - Provider requesting 1.5 hours / 90 minutes for procedure. as a surgical intervention.  The patient's history has been reviewed, patient examined, no change in status, stable for surgery.  I have reviewed the patient's chart and labs.  Questions were answered to the patient's satisfaction.     [place]

## 2021-05-08 ENCOUNTER — Encounter: Payer: Self-pay | Admitting: Surgery

## 2021-05-11 ENCOUNTER — Ambulatory Visit (INDEPENDENT_AMBULATORY_CARE_PROVIDER_SITE_OTHER): Payer: 59 | Admitting: Surgery

## 2021-05-11 ENCOUNTER — Other Ambulatory Visit: Payer: Self-pay

## 2021-05-11 ENCOUNTER — Encounter: Payer: Self-pay | Admitting: Surgery

## 2021-05-11 VITALS — BP 131/81 | HR 64 | Temp 98.0°F | Ht 71.0 in | Wt 244.0 lb

## 2021-05-11 DIAGNOSIS — Z09 Encounter for follow-up examination after completed treatment for conditions other than malignant neoplasm: Secondary | ICD-10-CM

## 2021-05-11 DIAGNOSIS — R221 Localized swelling, mass and lump, neck: Secondary | ICD-10-CM

## 2021-05-11 DIAGNOSIS — C8101 Nodular lymphocyte predominant Hodgkin lymphoma, lymph nodes of head, face, and neck: Secondary | ICD-10-CM

## 2021-05-11 NOTE — Patient Instructions (Signed)
You may shower now. Do not scrub at the area. The glue should start to come off in 1-2 more weeks. Do not submerge the area until the glue all comes off.  We will call you with the results once they come in.

## 2021-05-11 NOTE — Progress Notes (Signed)
Erik Buchanan Is following after left neck mass excision.  Pathology is pending.  He is doing well.  No fevers chills Takng PO, no swallowing issues, no hoarseness   PE NAD, walking on his own Neck: no hematomas, soft, incision c/d/I, no infection or seromas  A/P doing well Pending path We will call pt w path results and update Dr. Janese Banks No surgical issues at this time

## 2021-05-12 ENCOUNTER — Ambulatory Visit: Payer: 59

## 2021-05-15 ENCOUNTER — Inpatient Hospital Stay: Payer: 59 | Admitting: Oncology

## 2021-05-19 LAB — SURGICAL PATHOLOGY

## 2021-05-20 ENCOUNTER — Encounter: Payer: Self-pay | Admitting: Oncology

## 2021-05-20 ENCOUNTER — Inpatient Hospital Stay: Payer: 59 | Attending: Oncology | Admitting: Oncology

## 2021-05-20 ENCOUNTER — Other Ambulatory Visit: Payer: Self-pay

## 2021-05-20 ENCOUNTER — Other Ambulatory Visit: Payer: Self-pay | Admitting: Internal Medicine

## 2021-05-20 ENCOUNTER — Other Ambulatory Visit
Admission: RE | Admit: 2021-05-20 | Discharge: 2021-05-20 | Disposition: A | Discharge status: Home / Self Care | Payer: 59 | Source: Ambulatory Visit | Attending: Surgery | Admitting: Surgery

## 2021-05-20 ENCOUNTER — Telehealth: Payer: Self-pay | Admitting: Surgery

## 2021-05-20 VITALS — BP 131/77 | HR 70 | Temp 98.5°F | Resp 16 | Ht 71.0 in | Wt 245.0 lb

## 2021-05-20 DIAGNOSIS — Z79899 Other long term (current) drug therapy: Secondary | ICD-10-CM | POA: Diagnosis not present

## 2021-05-20 DIAGNOSIS — C8190 Hodgkin lymphoma, unspecified, unspecified site: Secondary | ICD-10-CM | POA: Diagnosis not present

## 2021-05-20 DIAGNOSIS — Z7189 Other specified counseling: Secondary | ICD-10-CM

## 2021-05-20 DIAGNOSIS — Z5111 Encounter for antineoplastic chemotherapy: Secondary | ICD-10-CM | POA: Diagnosis present

## 2021-05-20 DIAGNOSIS — C8178 Other classical Hodgkin lymphoma, lymph nodes of multiple sites: Secondary | ICD-10-CM

## 2021-05-20 MED ORDER — LORAZEPAM 0.5 MG PO TABS: 0.5000 mg | ORAL_TABLET | Freq: Four times a day (QID) | ORAL | 0 refills | Status: DC | PRN

## 2021-05-20 MED ORDER — DEXAMETHASONE 4 MG PO TABS: ORAL_TABLET | ORAL | 1 refills | Status: DC

## 2021-05-20 MED ORDER — ONDANSETRON HCL 8 MG PO TABS: 8.0000 mg | ORAL_TABLET | Freq: Two times a day (BID) | ORAL | 1 refills | Status: DC | PRN

## 2021-05-20 MED ORDER — PROCHLORPERAZINE MALEATE 10 MG PO TABS: 10.0000 mg | ORAL_TABLET | Freq: Four times a day (QID) | ORAL | 1 refills | Status: DC | PRN

## 2021-05-20 MED ORDER — LIDOCAINE-PRILOCAINE 2.5-2.5 % EX CREA: TOPICAL_CREAM | CUTANEOUS | 3 refills | Status: DC

## 2021-05-20 NOTE — Progress Notes (Signed)
Pt here to get patholoy results

## 2021-05-20 NOTE — Patient Instructions (Signed)
Your procedure is scheduled on: Thursday May 21, 2021. Report to Day Surgery inside [facility] 2nd floor. To find out your arrival time please call [phone] between 1PM - 3PM on Wednesday May 20, 2021.  Remember: Instructions that are not followed completely may result in serious medical risk,  up to and including death, or upon the discretion of your surgeon and anesthesiologist your  surgery may need to be rescheduled.     _X__ 1. Do not eat food after midnight the night before your procedure.                 No chewing gum or hard candies. You may drink clear liquids up to 2 hours                 before you are scheduled to arrive for your surgery- DO not drink clear                 liquids within 2 hours of the start of your surgery.                 Clear Liquids include:  water, apple juice without pulp, clear Gatorade, G2 or                  Gatorade Zero (avoid Red/Purple/Blue), Black Coffee or Tea (Do not add                 anything to coffee or tea).  __X__2.  On the morning of surgery brush your teeth with toothpaste and water, you                may rinse your mouth with mouthwash if you wish.  Do not swallow any toothpaste or mouthwash.     _X__ 3.  No Alcohol for 24 hours before or after surgery.   _X__ 4.  Do Not Smoke or use e-cigarettes For 24 Hours Prior to Your Surgery.                 Do not use any chewable tobacco products for at least 6 hours prior to                 Surgery.  _X__  5.  Do not use any recreational drugs (marijuana, cocaine, heroin, ecstasy, MDMA or other)                For at least one week prior to your surgery.  Combination of these drugs with anesthesia                May have life threatening results.  __X__ 6.  Notify your doctor if there is any change in your medical condition      (cold, fever, infections).     Do not wear jewelry, make-up, hairpins, clips or nail polish. Do not wear lotions,  powders, or perfumes. You may wear deodorant. Do not shave 48 hours prior to surgery. Men may shave face and neck. Do not bring valuables to the hospital.    [facility] is not responsible for any belongings or valuables.  Contacts, dentures or bridgework may not be worn into surgery. Leave your suitcase in the car. After surgery it may be brought to your room. For patients admitted to the hospital, discharge time is determined by your treatment team.   Patients discharged the day of surgery will not be allowed to drive home.   Make arrangements for someone to be  with you for the first 24 hours of your Same Day Discharge.   __X__ Take these medicines the morning of surgery with A SIP OF WATER:    1. buPROPion (WELLBUTRIN XL) 150 MG   2. amLODipine (NORVASC) 7.5 MG  3.   4.  5.  6.  ____ Fleet Enema (as directed)   __X__ Use Antibacterial Soap (or wipes) as directed  ____ Use Benzoyl Peroxide Gel as instructed  ____ Use inhalers on the day of surgery  __X__ Stop metformin 2 days prior to surgery    ____ Take 1/2 of usual insulin dose the night before surgery. No insulin the morning          of surgery.   ____ Call your PCP, cardiologist, or Pulmonologist if taking Coumadin/Plavix/aspirin and ask when to stop before your surgery.   __X__ One Week prior to surgery- Stop Anti-inflammatories such as Ibuprofen, Aleve, Advil, Motrin, meloxicam (MOBIC), diclofenac, etodolac, ketorolac, Toradol, Daypro, piroxicam, Goody's or BC powders. OK TO USE TYLENOL IF NEEDED   __X__ Stop supplements until after surgery.    ____ Bring C-Pap to the hospital.    If you have any questions regarding your pre-procedure instructions,  Please call Pre-admit Testing at [phone]

## 2021-05-20 NOTE — Addendum Note (Signed)
Addended by: Caroleen Hamman F on: 05/20/2021 10:36 AM   Modules accepted: Orders, SmartSet

## 2021-05-20 NOTE — Addendum Note (Signed)
Addended by: Caroleen Hamman F on: 05/20/2021 02:43 PM   Modules accepted: Orders

## 2021-05-20 NOTE — Progress Notes (Signed)
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

## 2021-05-20 NOTE — Progress Notes (Signed)
Hematology/Oncology Consult note [facility]  Telephone:([phone] [phone]  Patient Care Team: [place] Pink, MD as PCP - General (Family Medicine)   Name of the patient: Erik Buchanan  [id number]  October 28, 1960   Date of visit: 05/20/21  Diagnosis-stage III classical Hodgkin's lymphoma  Chief complaint/ Reason for visit-discuss pathology and PET scan results and further management  Heme/Onc history: patient is a 60 year old male with a past medical history significant for hypertension and diabetes among other medical problems.  He was noted to have left-sided neck swelling which has been ongoing for the last 4 months but he feels that the swelling has slowly increased in size.  He was seen by Dr. Kary Kos and underwent CT chest and CT soft tissue neck after initial ultrasound of the neck.  CT showed bulky supraclavicular adenopathy with the largest node measuring 4.1 cm CT chest with contrast also showed similar findings and no evidence of right supraclavicular axillary mediastinal or hilar adenopathy.  Lungs were clear.  Multiple hypodense lesions in the spleen which nearly replaces the splenic parenchyma.  Spleen however does not appear enlarged.   PET CT scan showed FDG avid adenopathy within the lower left neck to left supraclavicular region as well as small bowel mesentery peritoneum and porta hepatic lesions.  Multiple FDG avid splenic lesions.  Mild diffuse increased uptake in the bone marrow nonspecific.  No focal areas of increased uptake to suggest bone metastases.  Excisional supraclavicular lymph node biopsy showed classical Hodgkin's lymphoma. The H&E stained sections reveal lymph node effaced by a somewhat nodular proliferation of large, abnormal lymphoid cells, including "mummified" cells, "lacunar" cells, and occasional binucleated cells with classic Reed-Sternberg morphology, in a background of predominantly small lymphocytes.  EBV  negative    Interval history-patient reports left-sided neck discomfort but denies other complaints at this time.  He continues to be active and works daily.  ECOG PS- 0 Pain scale- 0 Opioid associated constipation- no  Review of systems- Review of Systems  Constitutional:  Negative for chills, fever, malaise/fatigue and weight loss.  HENT:  Negative for congestion, ear discharge and nosebleeds.   Eyes:  Negative for blurred vision.  Respiratory:  Negative for cough, hemoptysis, sputum production, shortness of breath and wheezing.   Cardiovascular:  Negative for chest pain, palpitations, orthopnea and claudication.  Gastrointestinal:  Negative for abdominal pain, blood in stool, constipation, diarrhea, heartburn, melena, nausea and vomiting.  Genitourinary:  Negative for dysuria, flank pain, frequency, hematuria and urgency.  Musculoskeletal:  Negative for back pain, joint pain and myalgias.  Skin:  Negative for rash.  Neurological:  Negative for dizziness, tingling, focal weakness, seizures, weakness and headaches.  Endo/Heme/Allergies:  Does not bruise/bleed easily.  Psychiatric/Behavioral:  Negative for depression and suicidal ideas. The patient does not have insomnia.       Allergies  Allergen Reactions   Ultram [Tramadol Hcl] Other (See Comments)    vertigo and passed out     Past Medical History:  Diagnosis Date   Anemia    Colon polyps    Depression    Diabetes mellitus without complication (HCC)    GERD (gastroesophageal reflux disease)    Hemorrhoids    Hyperlipidemia    Hypertension    Pneumonia    Testicular hypofunction    Tinnitus      Past Surgical History:  Procedure Laterality Date   COLONOSCOPY WITH PROPOFOL N/A 04/07/2015   Procedure: COLONOSCOPY WITH PROPOFOL;  Surgeon: Hulen Luster, MD;  Location: [facility] ENDOSCOPY;  Service: Gastroenterology;  Laterality: N/A;   COLONOSCOPY WITH PROPOFOL N/A 09/01/2020   Procedure: COLONOSCOPY WITH PROPOFOL;  Surgeon:  Toledo, Benay Pike, MD;  Location: [facility] ENDOSCOPY;  Service: Gastroenterology;  Laterality: N/A;   EYE SURGERY     cataract   MASS BIOPSY Left 05/07/2021   Procedure: NECK MASS BIOPSY,;  Surgeon: Jules Husbands, MD;  Location: [facility] ORS;  Service: General;  Laterality: Left;  Provider requesting 1.5 hours / 90 minutes for procedure.    Social History   Socioeconomic History   Marital status: Married    Spouse name: [name]   Number of children: Not on file   Years of education: Not on file   Highest education level: Not on file  Occupational History   Not on file  Tobacco Use   Smoking status: Never   Smokeless tobacco: Never  Vaping Use   Vaping Use: Never used  Substance and Sexual Activity   Alcohol use: Never   Drug use: Never   Sexual activity: Yes  Other Topics Concern   Not on file  Social History Narrative   Not on file   Social Determinants of Health   Financial Resource Strain: Not on file  Food Insecurity: Not on file  Transportation Needs: Not on file  Physical Activity: Not on file  Stress: Not on file  Social Connections: Not on file  Intimate Partner Violence: Not on file    Family History  Problem Relation Age of Onset   Arthritis Mother    Stroke Paternal Aunt      Current Outpatient Medications:    acidophilus (RISAQUAD) CAPS capsule, Take 1 capsule by mouth every evening., Disp: , Rfl:    amLODipine (NORVASC) 5 MG tablet, Take 7.5 mg by mouth daily., Disp: , Rfl:    buPROPion (WELLBUTRIN XL) 150 MG 24 hr tablet, Take 450 mg by mouth daily., Disp: , Rfl:    ibuprofen (ADVIL,MOTRIN) 200 MG tablet, Take 200-400 mg by mouth every 6 (six) hours as needed for moderate pain., Disp: , Rfl:    lisinopril (PRINIVIL,ZESTRIL) 10 MG tablet, Take 10 mg by mouth daily., Disp: , Rfl:    loratadine (CLARITIN) 10 MG tablet, Take 10 mg by mouth daily as needed for allergies., Disp: , Rfl:    metFORMIN (GLUCOPHAGE-XR) 500 MG 24 hr tablet, Take 1,000 mg by mouth in  the morning and at bedtime., Disp: , Rfl:    Multiple Vitamin (MULTIVITAMIN PO), Take 1 Package by mouth daily., Disp: , Rfl:    simvastatin (ZOCOR) 20 MG tablet, Take 20 mg by mouth every evening., Disp: , Rfl:    sodium chloride (OCEAN) 0.65 % SOLN nasal spray, Place 1 spray into both nostrils as needed for congestion., Disp: , Rfl:    Testosterone 1.62 % GEL, Apply 2 Pump topically in the morning. Applied to shoulders/upper arms, Disp: , Rfl:    tetrahydrozoline-zinc (VISINE-AC) 0.05-0.25 % ophthalmic solution, Place 2 drops into both eyes 3 (three) times daily as needed (dry eyes)., Disp: , Rfl:    Triamcinolone Acetonide (NASACORT AQ NA), Place 2 sprays into both nostrils daily as needed (allergies)., Disp: , Rfl:    dexamethasone (DECADRON) 4 MG tablet, Take 2 tablets by mouth once a day for 3 days after chemo. Take with food., Disp: 30 tablet, Rfl: 1   lidocaine-prilocaine (EMLA) cream, Apply to affected area once, Disp: 30 g, Rfl: 3   LORazepam (ATIVAN) 0.5 MG tablet, Take 1 tablet (0.5  mg total) by mouth every 6 (six) hours as needed (Nausea or vomiting)., Disp: 30 tablet, Rfl: 0   ondansetron (ZOFRAN) 4 MG tablet, Take 4 mg by mouth every 6 (six) hours., Disp: , Rfl:    ondansetron (ZOFRAN) 8 MG tablet, Take 1 tablet (8 mg total) by mouth 2 (two) times daily as needed. Start on the third day after chemotherapy., Disp: 30 tablet, Rfl: 1   prochlorperazine (COMPAZINE) 10 MG tablet, Take 1 tablet (10 mg total) by mouth every 6 (six) hours as needed (Nausea or vomiting)., Disp: 30 tablet, Rfl: 1  Physical exam:  Vitals:   05/20/21 0853  BP: 131/77  Pulse: 70  Resp: 16  Temp: 98.5 F (36.9 C)  TempSrc: Oral  Weight: 245 lb (111.1 kg)  Height: 5' 11" (1.803 m)   Physical Exam Constitutional:      General: He is not in acute distress. Cardiovascular:     Rate and Rhythm: Normal rate and regular rhythm.     Heart sounds: Normal heart sounds.  Pulmonary:     Effort: Pulmonary effort  is normal.     Breath sounds: Normal breath sounds.  Lymphadenopathy:     Comments: Palpable left cervical/left supraclavicular adenopathy  Skin:    General: Skin is warm and dry.  Neurological:     Mental Status: He is alert and oriented to person, place, and time.     CMP Latest Ref Rng & Units 04/29/2021  Glucose 70 - 99 mg/dL 99  BUN 6 - 20 mg/dL 19  Creatinine 0.61 - 1.24 mg/dL 1.27(H)  Sodium 135 - 145 mmol/L 135  Potassium 3.5 - 5.1 mmol/L 3.9  Chloride 98 - 111 mmol/L 101  CO2 22 - 32 mmol/L 27  Calcium 8.9 - 10.3 mg/dL 9.9  Total Protein 6.5 - 8.1 g/dL 8.0  Total Bilirubin 0.3 - 1.2 mg/dL 0.3  Alkaline Phos 38 - 126 U/L 83  AST 15 - 41 U/L 30  ALT 0 - 44 U/L 46(H)   CBC Latest Ref Rng & Units 04/29/2021  WBC 4.0 - 10.5 K/uL 10.6(H)  Hemoglobin 13.0 - 17.7 g/dL 11.7(L)  Hematocrit 39.0 - 52.0 % 36.0(L)  Platelets 150 - 400 K/uL 294      NM PET Image Initial (PI) Skull Base To Thigh (F-18 FDG)  Result Date: 05/08/2021 CLINICAL DATA:  Initial treatment strategy for neck mass. EXAM: NUCLEAR MEDICINE PET SKULL BASE TO THIGH TECHNIQUE: 12.9 mCi F-18 FDG was injected intravenously. Full-ring PET imaging was performed from the skull base to thigh after the radiotracer. CT data was obtained and used for attenuation correction and anatomic localization. Fasting blood glucose: 88 mg/dl COMPARISON:  04/16/2021 FINDINGS: Mediastinal blood pool activity: SUV max 3.22 Liver activity: SUV max NA NECK: Large FDG avid nodal mass within the left lower neck/left supraclavicular region is noted. Index lymph node measures 3.5 x 2.7 cm and has an SUV max 10.05, image 61/3. Incidental CT findings: none CHEST: Within the superior mediastinum there is a small left pre-vascular lymph node measuring 9 mm with SUV max 3.69, image 74/3. Lungs are clear.  No FDG avid pulmonary nodules. Incidental CT findings: Heart size is upper limits of normal. Lad coronary artery calcifications noted.  ABDOMEN/PELVIS: No abnormal FDG uptake identified within the liver or pancreas. The spleen measures 11 cm in cranial caudal dimension. Multiple FDG avid splenic lesions are identified with SUV max 15.15. Multifocal FDG avid abdominal lymph nodes are identified, including: Within the gastrohepatic ligament  there is a 1.5 cm lymph node within SUV max of 10.85, image 150/3. Pre caval lymph node measures 1.8 cm with SUV max of 12.3, image 157/3. FDG avid left retroperitoneal lymph node measures 1.3 cm within SUV max of 9.52. FDG avid aortocaval node measures 1.2 cm with SUV max of 7.25. Conglomeration of jejunal mesenteric lymph nodes within the left side of the small bowel mesentery measures 6.1 x 5.8 cm with SUV max 14.57. No FDG avid pelvic or inguinal lymph nodes. Incidental CT findings: Several cysts are noted within the right and left lobes of liver. SKELETON: Mild diffuse increased uptake within the bone marrow is identified, nonspecific. No focal areas above background bone marrow activity identified for reference purposes SUV max within the L3 vertebral body is equal to 4.4. SUV max within the right iliac bone is equal to 3.37. Incidental CT findings: none IMPRESSION: 1. Examination is positive for FDG avid adenopathy within the left lower neck and left supraclavicular region. There is also FDG avid adenopathy within the small bowel mesentery, retroperitoneum and porta hepatic regions. Multiple FDG avid splenic lesions are also identified. Findings are concerning for lymphoproliferative disorder. Correlation with tissue sampling is advised. The left supraclavicular nodal mass should be easily amenable to percutaneous core biopsy with fine needle aspiration under ultrasound guidance. 2. Mild diffuse increased uptake within the bone marrow is noted, nonspecific. No focal areas increased uptake to suggest bone metastases. Electronically Signed   By: Kerby Moors M.D.   On: 05/08/2021 12:03     Assessment and  plan- Patient is a 60 y.o. male with newly diagnosed stage III classical Hodgkin's lymphoma IPI score 2  I have reviewed PET/CT scan images independently and discussed findings with the patient.  Patient has nodal involvement on both sides of the diaphragm along with splenic involvement which makes his a stage III Hodgkin's lymphoma.  His IPI score is 2 which translates into a 5-year freedom from progression of 80% and 91% overall survival.  PET CT scan did not show overt concern for bone marrow involvement and I will not be pursuing bone marrow biopsy at this time  Surgery or radiation treatment is not to go to option for Hodgkin's lymphoma and he would need to be treated with systemic chemotherapy.  Given that he has advanced disease, I recommend Brentuximab along with Adriamycin vincristine and dacarbazine chemotherapy BV AVD which will be given on day 1 and day 15 of each cycle with growth factor support every 28 days for 6 cycles.  Interim PET/CT scan will be done after 3 cycles.  Discussed risks and benefits of chemotherapy including all but not limited to nausea, vomiting, low blood counts, risk of infections and hospitalization.  Risk of peripheral neuropathy associated with Brentuximab plus vincristine.  Risk of cardiotoxicity associated with Adriamycin.  Patient does not have any baseline neuropathy and I therefore prefer this regimen over ABVD.  ECHELON-1 is a phase III trial that randomly assigned >1300 patients with stage III or IV cHL to BV+AVD versus ABVD. The study reported that BV+AVD achieved marginally superior results at two years for the primary outcome, modified progression-free survival (mPFS; 82 versus 77 percent, respectively; HR 0.72, 95% CI 0.60 to 0.98); mPFS is a composite measure that included time to progression, death, and non-complete response with use of subsequent anticancer therapy. In overall survival stratification subgroup analyses, the benefit of A+AVD appeared to be  greater among 497 patients from [place] (HR = 0.33, 95% CI =  0.15-0.70) than among 669 from [place] (HR = 0.78, 95% CI = 0.47-1.32) and among 339 patients with four to seven International Prognostic Score risk factors (HR = 0.48, 95% CI = 0.26-0.88) and among 712 with two or three risk factors (HR = 0.62, 95% CI = 0.33-1.14) than among 283 with no risk factors or one risk factor (HR = 0.97, 95% CI =0.34-2.77).   Patient will get port placement with DR. Pabon.  I will obtain baseline echocardiogram as well.  I will tentatively see him back in 1 week's time to start BV AVD chemotherapy.  Treatment will be given with a curative intent  Cancer Staging Hodgkin's lymphoma [facility]) Staging form: Hodgkin and Non-Hodgkin Lymphoma, AJCC 8th Edition - Clinical stage from 05/20/2021: Stage III (Hodgkin lymphoma, A - Asymptomatic) - Signed by Sindy Guadeloupe, MD on 05/20/2021 Stage prefix: Initial diagnosis    Visit Diagnosis 1. Other classical Hodgkin lymphoma of lymph nodes of multiple regions ([place])   2. Goals of care, counseling/discussion      Dr. Randa Evens, MD, MPH [facility] [phone] 05/20/2021 12:14 PM

## 2021-05-20 NOTE — Telephone Encounter (Signed)
Patient has been advised of Pre-Admission date/time, COVID Testing date and Surgery date.  Surgery Date: 05/21/21 Preadmission Testing Date: 05/20/21 (phone 1p-5p) Covid Testing Date: Not needed.     Patient has been made aware to call [phone], between 1-3:00pm the day before surgery, to find out what time to arrive for surgery.

## 2021-05-20 NOTE — Progress Notes (Signed)
Pharmacist Chemotherapy Monitoring - Initial Assessment    Anticipated start date: 05/27/21   The following has been reviewed per standard work regarding the patient's treatment regimen: The patient's diagnosis, treatment plan and drug doses, and organ/hematologic function Lab orders and baseline tests specific to treatment regimen  The treatment plan start date, drug sequencing, and pre-medications Prior authorization status  Patient's documented medication list, including drug-drug interaction screen and prescriptions for anti-emetics and supportive care specific to the treatment regimen The drug concentrations, fluid compatibility, administration routes, and timing of the medications to be used The patient's access for treatment and lifetime cumulative dose history, if applicable  The patient's medication allergies and previous infusion related reactions, if applicable   Changes made to treatment plan:  treatment plan date  Follow up needed:  Pending authorization for treatment  and dose adjustment of Adcetris due to max dose  and signing treatment plan   Erik Buchanan, [place], 05/20/2021  1:03 PM

## 2021-05-21 ENCOUNTER — Ambulatory Visit: Payer: 59 | Admitting: Anesthesiology

## 2021-05-21 ENCOUNTER — Encounter
Admission: RE | Disposition: A | Discharge status: Home / Self Care | Payer: Self-pay | Source: Home / Self Care | Attending: Surgery

## 2021-05-21 ENCOUNTER — Other Ambulatory Visit: Payer: Self-pay

## 2021-05-21 ENCOUNTER — Ambulatory Visit
Admission: RE | Admit: 2021-05-21 | Discharge: 2021-05-21 | Disposition: A | Discharge status: Home / Self Care | Payer: 59 | Attending: Surgery | Admitting: Surgery

## 2021-05-21 ENCOUNTER — Encounter: Payer: Self-pay | Admitting: Surgery

## 2021-05-21 ENCOUNTER — Ambulatory Visit (HOSPITAL_BASED_OUTPATIENT_CLINIC_OR_DEPARTMENT_OTHER)
Admission: RE | Admit: 2021-05-21 | Discharge: 2021-05-21 | Disposition: A | Discharge status: Home / Self Care | Payer: 59 | Source: Ambulatory Visit | Attending: Oncology | Admitting: Oncology

## 2021-05-21 ENCOUNTER — Ambulatory Visit: Payer: 59

## 2021-05-21 DIAGNOSIS — R590 Localized enlarged lymph nodes: Secondary | ICD-10-CM | POA: Insufficient documentation

## 2021-05-21 DIAGNOSIS — D649 Anemia, unspecified: Secondary | ICD-10-CM | POA: Insufficient documentation

## 2021-05-21 DIAGNOSIS — E119 Type 2 diabetes mellitus without complications: Secondary | ICD-10-CM | POA: Insufficient documentation

## 2021-05-21 DIAGNOSIS — Z452 Encounter for adjustment and management of vascular access device: Secondary | ICD-10-CM | POA: Insufficient documentation

## 2021-05-21 DIAGNOSIS — R221 Localized swelling, mass and lump, neck: Secondary | ICD-10-CM

## 2021-05-21 DIAGNOSIS — D759 Disease of blood and blood-forming organs, unspecified: Secondary | ICD-10-CM | POA: Diagnosis not present

## 2021-05-21 DIAGNOSIS — C8190 Hodgkin lymphoma, unspecified, unspecified site: Secondary | ICD-10-CM | POA: Diagnosis not present

## 2021-05-21 DIAGNOSIS — C8191 Hodgkin lymphoma, unspecified, lymph nodes of head, face, and neck: Secondary | ICD-10-CM | POA: Diagnosis not present

## 2021-05-21 DIAGNOSIS — K219 Gastro-esophageal reflux disease without esophagitis: Secondary | ICD-10-CM | POA: Diagnosis not present

## 2021-05-21 DIAGNOSIS — I340 Nonrheumatic mitral (valve) insufficiency: Secondary | ICD-10-CM

## 2021-05-21 DIAGNOSIS — I10 Essential (primary) hypertension: Secondary | ICD-10-CM | POA: Diagnosis not present

## 2021-05-21 DIAGNOSIS — Z7189 Other specified counseling: Secondary | ICD-10-CM

## 2021-05-21 DIAGNOSIS — D7389 Other diseases of spleen: Secondary | ICD-10-CM | POA: Insufficient documentation

## 2021-05-21 DIAGNOSIS — Z95828 Presence of other vascular implants and grafts: Secondary | ICD-10-CM

## 2021-05-21 DIAGNOSIS — R599 Enlarged lymph nodes, unspecified: Secondary | ICD-10-CM | POA: Insufficient documentation

## 2021-05-21 HISTORY — PX: PORTACATH PLACEMENT: SHX2246

## 2021-05-21 LAB — ECHOCARDIOGRAM COMPLETE
AR max vel: 4.39 cm2
AV Area VTI: 4.43 cm2
AV Area mean vel: 4.61 cm2
AV Mean grad: 4 mmHg
AV Peak grad: 8.1 mmHg
Ao pk vel: 1.42 m/s
Area-P 1/2: 3.26 cm2
MV VTI: 6.2 cm2
S' Lateral: 3.3 cm

## 2021-05-21 LAB — GLUCOSE, CAPILLARY
Glucose-Capillary: 85 mg/dL (ref 70–99)
Glucose-Capillary: 127 mg/dL — ABNORMAL HIGH (ref 70–99)

## 2021-05-21 SURGERY — INSERTION, TUNNELED CENTRAL VENOUS DEVICE, WITH PORT
Anesthesia: General | Site: Chest | Laterality: Right

## 2021-05-21 MED ORDER — ACETAMINOPHEN 500 MG PO TABS
ORAL_TABLET | ORAL | Status: AC
  Administered 2021-05-21: 1000 mg via ORAL
  Filled 2021-05-21: qty 2

## 2021-05-21 MED ORDER — LIDOCAINE HCL (PF) 1 % IJ SOLN
INTRAMUSCULAR | Status: DC | PRN
  Administered 2021-05-21: 30 mL

## 2021-05-21 MED ORDER — PROPOFOL 10 MG/ML IV BOLUS
INTRAVENOUS | Status: AC
  Filled 2021-05-21: qty 20

## 2021-05-21 MED ORDER — GABAPENTIN 300 MG PO CAPS
ORAL_CAPSULE | ORAL | Status: AC
  Administered 2021-05-21: 300 mg via ORAL
  Filled 2021-05-21: qty 1

## 2021-05-21 MED ORDER — FAMOTIDINE 20 MG PO TABS: 20.0000 mg | ORAL_TABLET | Freq: Once | ORAL | Status: AC

## 2021-05-21 MED ORDER — LIDOCAINE HCL (PF) 2 % IJ SOLN
INTRAMUSCULAR | Status: AC
  Filled 2021-05-21: qty 5

## 2021-05-21 MED ORDER — DEXMEDETOMIDINE (PRECEDEX) IN NS 20 MCG/5ML (4 MCG/ML) IV SYRINGE
PREFILLED_SYRINGE | INTRAVENOUS | Status: DC | PRN
  Administered 2021-05-21: 8 ug via INTRAVENOUS

## 2021-05-21 MED ORDER — FENTANYL CITRATE (PF) 100 MCG/2ML IJ SOLN
INTRAMUSCULAR | Status: DC | PRN
  Administered 2021-05-21: 25 ug via INTRAVENOUS

## 2021-05-21 MED ORDER — MIDAZOLAM HCL 2 MG/2ML IJ SOLN
INTRAMUSCULAR | Status: DC | PRN
  Administered 2021-05-21: 2 mg via INTRAVENOUS

## 2021-05-21 MED ORDER — BUPIVACAINE-EPINEPHRINE (PF) 0.25% -1:200000 IJ SOLN
INTRAMUSCULAR | Status: AC
  Filled 2021-05-21: qty 30

## 2021-05-21 MED ORDER — GABAPENTIN 300 MG PO CAPS: 300.0000 mg | ORAL_CAPSULE | ORAL | Status: AC

## 2021-05-21 MED ORDER — SODIUM CHLORIDE 0.9 % IV SOLN
INTRAVENOUS | Status: DC | PRN
  Administered 2021-05-21: 5 mL via INTRAVENOUS

## 2021-05-21 MED ORDER — HYDROCODONE-ACETAMINOPHEN 5-325 MG PO TABS: 1.0000 | ORAL_TABLET | Freq: Four times a day (QID) | ORAL | 0 refills | Status: DC | PRN

## 2021-05-21 MED ORDER — PROPOFOL 500 MG/50ML IV EMUL
INTRAVENOUS | Status: DC | PRN
  Administered 2021-05-21: 120 ug/kg/min via INTRAVENOUS

## 2021-05-21 MED ORDER — FENTANYL CITRATE (PF) 100 MCG/2ML IJ SOLN
INTRAMUSCULAR | Status: AC
  Filled 2021-05-21: qty 2

## 2021-05-21 MED ORDER — CHLORHEXIDINE GLUCONATE 0.12 % MT SOLN
OROMUCOSAL | Status: AC
  Administered 2021-05-21: 15 mL via OROMUCOSAL
  Filled 2021-05-21: qty 15

## 2021-05-21 MED ORDER — CHLORHEXIDINE GLUCONATE CLOTH 2 % EX PADS
6.0000 | MEDICATED_PAD | Freq: Once | CUTANEOUS | Status: AC
  Administered 2021-05-21: 6 via TOPICAL

## 2021-05-21 MED ORDER — LIDOCAINE HCL (PF) 2 % IJ SOLN
INTRAMUSCULAR | Status: DC | PRN
  Administered 2021-05-21: 100 mg via INTRADERMAL

## 2021-05-21 MED ORDER — SODIUM CHLORIDE 0.9 % IV SOLN
INTRAVENOUS | Status: AC | PRN
  Administered 2021-05-21: 500 mL

## 2021-05-21 MED ORDER — DEXMEDETOMIDINE (PRECEDEX) IN NS 20 MCG/5ML (4 MCG/ML) IV SYRINGE: PREFILLED_SYRINGE | INTRAVENOUS | Status: DC | PRN

## 2021-05-21 MED ORDER — ORAL CARE MOUTH RINSE: 15.0000 mL | Freq: Once | OROMUCOSAL | Status: AC

## 2021-05-21 MED ORDER — CHLORHEXIDINE GLUCONATE CLOTH 2 % EX PADS: 6.0000 | MEDICATED_PAD | Freq: Once | CUTANEOUS | Status: DC

## 2021-05-21 MED ORDER — MIDAZOLAM HCL 2 MG/2ML IJ SOLN
INTRAMUSCULAR | Status: AC
  Filled 2021-05-21: qty 2

## 2021-05-21 MED ORDER — PROPOFOL 10 MG/ML IV BOLUS
INTRAVENOUS | Status: DC | PRN
  Administered 2021-05-21: 100 mg via INTRAVENOUS

## 2021-05-21 MED ORDER — CEFAZOLIN SODIUM-DEXTROSE 2-4 GM/100ML-% IV SOLN
INTRAVENOUS | Status: AC
  Filled 2021-05-21: qty 100

## 2021-05-21 MED ORDER — FAMOTIDINE 20 MG PO TABS
ORAL_TABLET | ORAL | Status: AC
  Administered 2021-05-21: 20 mg via ORAL
  Filled 2021-05-21: qty 1

## 2021-05-21 MED ORDER — LIDOCAINE HCL (PF) 1 % IJ SOLN
INTRAMUSCULAR | Status: AC
  Filled 2021-05-21: qty 30

## 2021-05-21 MED ORDER — CELECOXIB 200 MG PO CAPS
ORAL_CAPSULE | ORAL | Status: AC
  Administered 2021-05-21: 200 mg via ORAL
  Filled 2021-05-21: qty 1

## 2021-05-21 MED ORDER — HEPARIN SODIUM (PORCINE) 5000 UNIT/ML IJ SOLN
INTRAMUSCULAR | Status: AC
  Filled 2021-05-21: qty 1

## 2021-05-21 MED ORDER — CHLORHEXIDINE GLUCONATE 0.12 % MT SOLN: 15.0000 mL | Freq: Once | OROMUCOSAL | Status: AC

## 2021-05-21 MED ORDER — SODIUM CHLORIDE 0.9 % IV SOLN: INTRAVENOUS | Status: DC

## 2021-05-21 MED ORDER — CELECOXIB 200 MG PO CAPS: 200.0000 mg | ORAL_CAPSULE | ORAL | Status: AC

## 2021-05-21 MED ORDER — ACETAMINOPHEN 500 MG PO TABS: 1000.0000 mg | ORAL_TABLET | ORAL | Status: AC

## 2021-05-21 MED ORDER — DEXMEDETOMIDINE (PRECEDEX) IN NS 20 MCG/5ML (4 MCG/ML) IV SYRINGE
PREFILLED_SYRINGE | INTRAVENOUS | Status: AC
  Filled 2021-05-21: qty 5

## 2021-05-21 MED ORDER — CEFAZOLIN SODIUM-DEXTROSE 2-4 GM/100ML-% IV SOLN
2.0000 g | INTRAVENOUS | Status: AC
  Administered 2021-05-21: 2 g via INTRAVENOUS

## 2021-05-21 SURGICAL SUPPLY — 38 items
ADH SKN CLS APL DERMABOND .7 (GAUZE/BANDAGES/DRESSINGS) ×1
APL PRP STRL LF DISP 70% ISPRP (MISCELLANEOUS) ×1
BAG DECANTER FOR FLEXI CONT (MISCELLANEOUS) ×4 IMPLANT
BLADE SURG SZ11 CARB STEEL (BLADE) ×2 IMPLANT
BOOT SUTURE AID YELLOW STND (SUTURE) ×2 IMPLANT
CHLORAPREP W/TINT 26 (MISCELLANEOUS) ×2 IMPLANT
COVER LIGHT HANDLE STERIS (MISCELLANEOUS) ×4 IMPLANT
DERMABOND ADVANCED (GAUZE/BANDAGES/DRESSINGS) ×1
DERMABOND ADVANCED .7 DNX12 (GAUZE/BANDAGES/DRESSINGS) ×1 IMPLANT
DRAPE 3/4 80X56 (DRAPES) ×2 IMPLANT
DRAPE C-ARM XRAY 36X54 (DRAPES) ×4 IMPLANT
DRAPE INCISE IOBAN 66X45 STRL (DRAPES) ×2 IMPLANT
ELECT CAUTERY BLADE 6.4 (BLADE) ×2 IMPLANT
ELECT REM PT RETURN 9FT ADLT (ELECTROSURGICAL) ×2
ELECTRODE REM PT RTRN 9FT ADLT (ELECTROSURGICAL) ×1 IMPLANT
GAUZE 4X4 16PLY ~~LOC~~+RFID DBL (SPONGE) ×2 IMPLANT
GEL ULTRASOUND 20GR AQUASONIC (MISCELLANEOUS) ×2 IMPLANT
GLOVE SURG ENC MOIS LTX SZ7 (GLOVE) ×2 IMPLANT
GOWN STRL REUS W/ TWL LRG LVL3 (GOWN DISPOSABLE) ×2 IMPLANT
GOWN STRL REUS W/TWL LRG LVL3 (GOWN DISPOSABLE) ×4
IV NS 500ML (IV SOLUTION) ×2
IV NS 500ML BAXH (IV SOLUTION) ×1 IMPLANT
KIT PORT POWER 8FR ISP CVUE (Port) ×2 IMPLANT
MANIFOLD NEPTUNE II (INSTRUMENTS) ×2 IMPLANT
NEEDLE HYPO 22GX1.5 SAFETY (NEEDLE) ×2 IMPLANT
PACK PORT-A-CATH (MISCELLANEOUS) ×2 IMPLANT
SPONGE T-LAP 18X18 ~~LOC~~+RFID (SPONGE) ×2 IMPLANT
SUT MNCRL AB 4-0 PS2 18 (SUTURE) ×2 IMPLANT
SUT PROLENE 2-0 (SUTURE) ×2
SUT PROLENE 2-0 RB1 36X2 ARM (SUTURE) ×1
SUT VIC AB 3-0 SH 27 (SUTURE) ×2
SUT VIC AB 3-0 SH 27X BRD (SUTURE) ×1 IMPLANT
SUTURE PROLEN 2-0 RB1 36X2 ARM (SUTURE) ×1 IMPLANT
SYR 10ML LL (SYRINGE) ×2 IMPLANT
SYR 20ML LL LF (SYRINGE) ×2 IMPLANT
SYR 5ML LL (SYRINGE) ×2 IMPLANT
TOWEL OR 17X26 4PK STRL BLUE (TOWEL DISPOSABLE) ×2 IMPLANT
WATER STERILE IRR 500ML POUR (IV SOLUTION) ×2 IMPLANT

## 2021-05-21 NOTE — Transfer of Care (Signed)
Immediate Anesthesia Transfer of Care Note  Patient: Erik Buchanan  Procedure(s) Performed: INSERTION PORT-A-CATH (Right: Chest)  Patient Location: PACU  Anesthesia Type:General  Level of Consciousness: drowsy and patient cooperative  Airway & Oxygen Therapy: Patient Spontanous Breathing  Post-op Assessment: Report given to RN and Post -op Vital signs reviewed and stable  Post vital signs: Reviewed and stable  Last Vitals:  Vitals Value Taken Time  BP 102/62 05/21/21 1625  Temp    Pulse 57 05/21/21 1625  Resp 15 05/21/21 1625  SpO2 95 % 05/21/21 1625  Vitals shown include unvalidated device data.  Last Pain:  Vitals:   05/21/21 1442  TempSrc: Temporal  PainSc: 0-No pain         Complications: No notable events documented.

## 2021-05-21 NOTE — Interval H&P Note (Signed)
History and Physical Interval Note:  05/21/2021 2:46 PM  Erik Buchanan  has presented today for surgery, with the diagnosis of supraclavicular adenopathy.  The various methods of treatment have been discussed with the patient and family. After consideration of risks, benefits and other options for treatment, the patient has consented to  Procedure(s): INSERTION PORT-A-CATH (Right) as a surgical intervention.  The patient's history has been reviewed, patient examined, no change in status, stable for surgery.  Now in need for port. Procedure d/w the pt in detail, risks, benefits and possible complications. I have reviewed the patient's chart and labs.  Questions were answered to the patient's satisfaction.     [name]

## 2021-05-21 NOTE — Progress Notes (Signed)
*  PRELIMINARY RESULTS* Echocardiogram 2D Echocardiogram has been performed.  Erik Buchanan 05/21/2021, 11:03 AM

## 2021-05-21 NOTE — Op Note (Signed)
  Pre-operative Diagnosis: Lymphoma  Post-operative Diagnosis: same   Surgeon: Caroleen Hamman, MD FACS  Anesthesia: IV sedation, marcaine .25% w epi and lidocaine 1%  Procedure: right IJ  Port placement with fluoroscopy under U/S guidance  Findings: Good position of the tip of the catheter by fluoroscopy  Estimated Blood Loss: Minimal         Drains: None         Specimens: None       Complications: none            Procedure Details  The patient was seen again in the Holding Room. The benefits, complications, treatment options, and expected outcomes were discussed with the patient. The risks of bleeding, infection, recurrence of symptoms, failure to resolve symptoms,  thrombosis nonfunction breakage pneumothorax hemopneumothorax any of which could require chest tube or further surgery were reviewed with the patient.   The patient was taken to Operating Room, identified as Erik Buchanan and the procedure verified.  A Time Out was held and the above information confirmed.  Prior to the induction of general anesthesia, antibiotic prophylaxis was administered. VTE prophylaxis was in place. Appropriate anesthesia was then administered and tolerated well. The chest was prepped with Chloraprep and draped in the sterile fashion. The patient was positioned in the supine position. Then the patient was placed in Trendelenburg position.  Patient was prepped and draped in sterile fashion and in a Trendelenburg position local anesthetic was infiltrated into the skin and subcutaneous tissues in the neck and anterior chest wall. The large bore needle was placed into the internal jugular vein under U/S guidance without difficulty and then the Seldinger wire was advanced. Fluoroscopy was utilized to confirm that the Seldinger wire was in the superior vena cava.  An incision was made and a port pocket developed with blunt and electrocautery dissection. The introducer dilator was placed over the  Seldinger wire the wire was removed. The previously flushed catheter was placed into the introducer dilator and the peel-away sheath was removed. The catheter length was confirmed and trimmed utilizing fluoroscopy for proper positioning. The catheter was then attached to the previously flushed port. The port was placed into the pocket. The port was held in with 2-0 Prolenes and flushed for function and heparin locked.  The wound was closed with interrupted 3-0 Vicryl followed by 4-0 subcuticular Monocryl sutures. Dermabond used to coat the skin  Patient was taken to the recovery room in stable condition where a postoperative chest film has been ordered.

## 2021-05-21 NOTE — Discharge Instructions (Addendum)
Implanted Port Insertion, Care After The following information offers guidance on how to care for yourself after your procedure. Your health care provider may also give you more specific instructions. If you have problems or questions, contact your health care provider. What can I expect after the procedure? After the procedure, it is common to have: Discomfort at the port insertion site. Bruising on the skin over the port. This should improve over 3-4 days. Follow these instructions at home: [facility] care After your port is placed, you will get a manufacturer's information card. The card has information about your port. Keep this card with you at all times. Take care of the port as told by your health care provider. Ask your health care provider if you or a family member can get training for taking care of the port at home. A home health care nurse will be be available to help care for the port. Make sure to remember what type of port you have. Incision care    Follow instructions from your health care provider about how to take care of your port insertion site. Make sure you: Wash your hands with soap and water for at least 20 seconds before and after you change your bandage (dressing). If soap and water are not available, use hand sanitizer. Change your dressing as told by your health care provider. Leave stitches (sutures), skin glue, or adhesive strips in place. These skin closures may need to stay in place for 2 weeks or longer. If adhesive strip edges start to loosen and curl up, you may trim the loose edges. Do not remove adhesive strips completely unless your health care provider tells you to do that. Check your port insertion site every day for signs of infection. Check for: Redness, swelling, or pain. Fluid or blood. Warmth. Pus or a bad smell. Activity Return to your normal activities as told by your health care provider. Ask your health care provider what activities are safe for  you. You may have to avoid lifting. Ask your health care provider how much you can safely lift. General instructions Take over-the-counter and prescription medicines only as told by your health care provider. Do not take baths, swim, or use a hot tub until your health care provider approves. Ask your health care provider if you may take showers. You may only be allowed to take sponge baths. If you were given a sedative during the procedure, it can affect you for several hours. Do not drive or operate machinery until your health care provider says that it is safe. Wear a medical alert bracelet in case of an emergency. This will tell any health care providers that you have a port. Keep all follow-up visits. This is important. Contact a health care provider if: You cannot flush your port with saline as directed, or you cannot draw blood from the port. You have a fever or chills. You have redness, swelling, or pain around your port insertion site. You have fluid or blood coming from your port insertion site. Your port insertion site feels warm to the touch. You have pus or a bad smell coming from the port insertion site. Get help right away if: You have chest pain or shortness of breath. You have bleeding from your port that you cannot control. These symptoms may be an emergency. Get help right away. Call 911. Do not wait to see if the symptoms will go away. Do not drive yourself to the hospital. Summary Take care of the port  as told by your health care provider. Keep the manufacturer's information card with you at all times. Change your dressing as told by your health care provider. Contact a health care provider if you have a fever or chills or if you have redness, swelling, or pain around your port insertion site. Keep all follow-up visits. This information is not intended to replace advice given to you by your health care provider. Make sure you discuss any questions you have with your  health care provider. Document Revised: 12/30/2020 Document Reviewed: 12/30/2020 Elsevier Patient Education  2022 [place]   The drugs that you were given will stay in your system until tomorrow so for the next 24 hours you should not:  Drive an automobile Make any legal decisions Drink any alcoholic beverage   You may resume regular meals tomorrow.  Today it is better to start with liquids and gradually work up to solid foods.  You may eat anything you prefer, but it is better to start with liquids, then soup and crackers, and gradually work up to solid foods.   Please notify your doctor immediately if you have any unusual bleeding, trouble breathing, redness and pain at the surgery site, drainage, fever, or pain not relieved by medication.    Additional Instructions:   Please contact your physician with any problems or Same Day Surgery at [phone], Monday through Friday 6 am to 4 pm, or [facility] number at [phone].

## 2021-05-21 NOTE — Anesthesia Preprocedure Evaluation (Addendum)
Anesthesia Evaluation  Patient identified by MRN, date of birth, ID band Patient awake    Reviewed: Allergy & Precautions, NPO status , Patient's Chart, lab work & pertinent test results  History of Anesthesia Complications Negative for: history of anesthetic complications  Airway Mallampati: III  TM Distance: >3 FB Neck ROM: full    Dental  (+) Teeth Intact   Pulmonary neg pulmonary ROS, neg shortness of breath,    Pulmonary exam normal breath sounds clear to auscultation       Cardiovascular Exercise Tolerance: Good hypertension, (-) angina(-) Past MI and (-) DOE Normal cardiovascular exam Rhythm:Regular Rate:Normal     Neuro/Psych PSYCHIATRIC DISORDERS Depression negative neurological ROS     GI/Hepatic Neg liver ROS, GERD  Medicated and Controlled,  Endo/Other  diabetes, Type 2  Renal/GU      Musculoskeletal   Abdominal   Peds  Hematology  (+) Blood dyscrasia, anemia ,   Anesthesia Other Findings 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  Past Medical History: No date: Anemia No date: Colon polyps No date: Depression No date: Diabetes mellitus without complication (HCC) No date: GERD (gastroesophageal reflux disease) No date: Hemorrhoids No date: Hyperlipidemia No date: Hypertension No date: Pneumonia 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 No date: Tinnitus  Past Surgical History: 04/07/2015: COLONOSCOPY WITH PROPOFOL; N/A     Comment:  Procedure: COLONOSCOPY WITH PROPOFOL;  Surgeon: Hulen Luster, MD;  Location: [facility];  Service:               Gastroenterology;  Laterality: N/A; 09/01/2020: COLONOSCOPY WITH PROPOFOL; N/A     Comment:  Procedure: COLONOSCOPY WITH PROPOFOL;  Surgeon: [place],               Benay Pike, MD;  Location: [facility] END[facility];  Service:               Gastroenterology;  Laterality: N/A; No date: EYE SURGERY     Comment:  cataract      Reproductive/Obstetrics negative OB ROS                            Anesthesia Physical  Anesthesia Plan  ASA: 3  Anesthesia Plan: General   Post-op Pain Management:    Induction: Intravenous  PONV Risk Score and Plan: 2 and Ondansetron, Midazolam, Treatment may vary due to age or medical condition, Propofol infusion and TIVA  Airway Management Planned: Natural Airway  Additional Equipment: None  Intra-op Plan:   Post-operative Plan:   Informed Consent: I have reviewed the patients History and Physical, chart, labs and discussed the procedure including the risks, benefits and alternatives for the proposed anesthesia with the patient or authorized representative who has indicated his/her understanding and acceptance.     Dental advisory given  Plan Discussed with: Anesthesiologist, CRNA and Surgeon  Anesthesia Plan Comments: (Discussed risks of anesthesia with patient, including possibility of difficulty with spontaneous ventilation under anesthesia necessitating airway intervention, PONV, and rare risks such as cardiac or respiratory or neurological events, and allergic reactions. Discussed the role of CRNA in patient's perioperative care. Patient understands.)       Anesthesia Quick Evaluation

## 2021-05-22 ENCOUNTER — Other Ambulatory Visit: Payer: 59

## 2021-05-22 NOTE — Anesthesia Postprocedure Evaluation (Signed)
Anesthesia Post Note  Patient: Erik Buchanan  Procedure(s) Performed: INSERTION PORT-A-CATH (Right: Chest)  Patient location during evaluation: PACU Anesthesia Type: General Level of consciousness: awake and alert Pain management: pain level controlled Vital Signs Assessment: post-procedure vital signs reviewed and stable Respiratory status: spontaneous breathing, nonlabored ventilation, respiratory function stable and patient connected to nasal cannula oxygen Cardiovascular status: blood pressure returned to baseline and stable Postop Assessment: no apparent nausea or vomiting Anesthetic complications: no   No notable events documented.   Last Vitals:  Vitals:   05/21/21 1645 05/21/21 1657  BP: 119/70   Pulse: (!) 54 (!) (P) 52  Resp: 14 (P) 18  Temp:  (!) (P) 36.2 C  SpO2: 100% (P) 99%    Last Pain:  Vitals:   05/21/21 1657  TempSrc: (P) Temporal  PainSc: (P) 0-No pain                 Arita Miss

## 2021-05-24 ENCOUNTER — Encounter: Payer: Self-pay | Admitting: Surgery

## 2021-05-25 ENCOUNTER — Inpatient Hospital Stay: Payer: 59

## 2021-05-25 ENCOUNTER — Other Ambulatory Visit: Payer: Self-pay

## 2021-05-27 ENCOUNTER — Encounter: Payer: Self-pay | Admitting: Oncology

## 2021-05-27 ENCOUNTER — Other Ambulatory Visit: Payer: Self-pay

## 2021-05-27 ENCOUNTER — Inpatient Hospital Stay (HOSPITAL_BASED_OUTPATIENT_CLINIC_OR_DEPARTMENT_OTHER): Payer: 59 | Admitting: Oncology

## 2021-05-27 ENCOUNTER — Inpatient Hospital Stay: Payer: 59

## 2021-05-27 ENCOUNTER — Other Ambulatory Visit: Payer: Self-pay | Admitting: *Deleted

## 2021-05-27 VITALS — BP 134/82 | HR 68

## 2021-05-27 VITALS — BP 145/89 | HR 84 | Temp 97.0°F | Resp 18 | Wt 246.0 lb

## 2021-05-27 DIAGNOSIS — C8178 Other classical Hodgkin lymphoma, lymph nodes of multiple sites: Secondary | ICD-10-CM

## 2021-05-27 DIAGNOSIS — C8108 Nodular lymphocyte predominant Hodgkin lymphoma, lymph nodes of multiple sites: Secondary | ICD-10-CM | POA: Diagnosis not present

## 2021-05-27 DIAGNOSIS — Z5111 Encounter for antineoplastic chemotherapy: Secondary | ICD-10-CM

## 2021-05-27 DIAGNOSIS — C8190 Hodgkin lymphoma, unspecified, unspecified site: Secondary | ICD-10-CM | POA: Diagnosis not present

## 2021-05-27 LAB — CBC WITH DIFFERENTIAL/PLATELET
Abs Immature Granulocytes: 0.05 10*3/uL (ref 0.00–0.07)
Basophils Absolute: 0 10*3/uL (ref 0.0–0.1)
Basophils Relative: 1 %
Eosinophils Absolute: 0.2 10*3/uL (ref 0.0–0.5)
Eosinophils Relative: 3 %
HCT: 35.2 % — ABNORMAL LOW (ref 39.0–52.0)
Hemoglobin: 11.5 g/dL — ABNORMAL LOW (ref 13.0–17.0)
Immature Granulocytes: 1 %
Lymphocytes Relative: 17 %
Lymphs Abs: 1.2 10*3/uL (ref 0.7–4.0)
MCH: 26.2 pg (ref 26.0–34.0)
MCHC: 32.7 g/dL (ref 30.0–36.0)
MCV: 80.2 fL (ref 80.0–100.0)
Monocytes Absolute: 0.4 10*3/uL (ref 0.1–1.0)
Monocytes Relative: 6 %
Neutro Abs: 5.2 10*3/uL (ref 1.7–7.7)
Neutrophils Relative %: 72 %
Platelets: 275 10*3/uL (ref 150–400)
RBC: 4.39 MIL/uL (ref 4.22–5.81)
RDW: 13.8 % (ref 11.5–15.5)
WBC: 7.1 10*3/uL (ref 4.0–10.5)
nRBC: 0 % (ref 0.0–0.2)

## 2021-05-27 LAB — COMPREHENSIVE METABOLIC PANEL
ALT: 40 U/L (ref 0–44)
AST: 27 U/L (ref 15–41)
Albumin: 4.1 g/dL (ref 3.5–5.0)
Alkaline Phosphatase: 86 U/L (ref 38–126)
Anion gap: 12 (ref 5–15)
BUN: 17 mg/dL (ref 6–20)
CO2: 24 mmol/L (ref 22–32)
Calcium: 9.1 mg/dL (ref 8.9–10.3)
Chloride: 99 mmol/L (ref 98–111)
Creatinine, Ser: 0.99 mg/dL (ref 0.61–1.24)
GFR, Estimated: >60 mL/min (ref >60)
Glucose, Bld: 173 mg/dL — ABNORMAL HIGH (ref 70–99)
Potassium: 4 mmol/L (ref 3.5–5.1)
Sodium: 135 mmol/L (ref 135–145)
Total Bilirubin: 0.3 mg/dL (ref 0.3–1.2)
Total Protein: 7.6 g/dL (ref 6.5–8.1)

## 2021-05-27 MED ORDER — SODIUM CHLORIDE 0.9 % IV SOLN
10.0000 mg | Freq: Once | INTRAVENOUS | Status: AC
  Administered 2021-05-27: 10 mg via INTRAVENOUS
  Filled 2021-05-27: qty 10

## 2021-05-27 MED ORDER — SODIUM CHLORIDE 0.9 % IV SOLN
375.0000 mg/m2 | Freq: Once | INTRAVENOUS | Status: AC
  Administered 2021-05-27: 890 mg via INTRAVENOUS
  Filled 2021-05-27: qty 89

## 2021-05-27 MED ORDER — SODIUM CHLORIDE 0.9 % IV SOLN
150.0000 mg | Freq: Once | INTRAVENOUS | Status: AC
  Administered 2021-05-27: 150 mg via INTRAVENOUS
  Filled 2021-05-27: qty 150

## 2021-05-27 MED ORDER — DIPHENHYDRAMINE HCL 50 MG/ML IJ SOLN
50.0000 mg | Freq: Once | INTRAMUSCULAR | Status: AC
  Administered 2021-05-27: 50 mg via INTRAVENOUS
  Filled 2021-05-27: qty 1

## 2021-05-27 MED ORDER — PALONOSETRON HCL INJECTION 0.25 MG/5ML
0.2500 mg | Freq: Once | INTRAVENOUS | Status: AC
  Administered 2021-05-27: 0.25 mg via INTRAVENOUS
  Filled 2021-05-27: qty 5

## 2021-05-27 MED ORDER — SODIUM CHLORIDE 0.9 % IV SOLN
120.0000 mg | Freq: Once | INTRAVENOUS | Status: AC
  Administered 2021-05-27: 120 mg via INTRAVENOUS
  Filled 2021-05-27: qty 24

## 2021-05-27 MED ORDER — SODIUM CHLORIDE 0.9 % IV SOLN
Freq: Once | INTRAVENOUS | Status: AC
  Filled 2021-05-27: qty 250

## 2021-05-27 MED ORDER — SODIUM CHLORIDE 0.9% FLUSH
10.0000 mL | Freq: Once | INTRAVENOUS | Status: AC
  Administered 2021-05-27: 10 mL via INTRAVENOUS
  Filled 2021-05-27: qty 10

## 2021-05-27 MED ORDER — DOXORUBICIN HCL CHEMO IV INJECTION 2 MG/ML
25.0000 mg/m2 | Freq: Once | INTRAVENOUS | Status: AC
  Administered 2021-05-27: 60 mg via INTRAVENOUS
  Filled 2021-05-27: qty 30

## 2021-05-27 MED ORDER — VINBLASTINE SULFATE CHEMO INJECTION 1 MG/ML
6.0000 mg/m2 | Freq: Once | INTRAVENOUS | Status: AC
  Administered 2021-05-27: 14.2 mg via INTRAVENOUS
  Filled 2021-05-27: qty 14.2

## 2021-05-27 MED ORDER — ACETAMINOPHEN 325 MG PO TABS
650.0000 mg | ORAL_TABLET | Freq: Once | ORAL | Status: AC
  Administered 2021-05-27: 650 mg via ORAL
  Filled 2021-05-27: qty 2

## 2021-05-27 NOTE — Progress Notes (Signed)
Dacarbazine started at 11:30, at 12:00, patient stated that he was feeling numbness and tingling in his hands/fingers. Stopped the med. BP 124/61, pulse 54, resp 16, O2 sat 99. No other complaints. The numbness has eased up some, but still there. MD Notified. Per Dr. Janese Banks, ok to continue. Dacarbazine restarted at 12:08 and finished without difficulty.

## 2021-05-27 NOTE — Progress Notes (Signed)
Hematology/Oncology Consult note [facility]  Telephone:([phone] [phone]  Patient Care Team: Maryland Pink, MD as PCP - General (Family Medicine)   Name of the patient: Erik Buchanan  [id number]  08/06/1960   Date of visit: 05/27/21  Diagnosis- stage III classical Hodgkin's lymphoma  Chief complaint/ Reason for visit-on treatment assessment prior to cycle 1 day 1 of BV AVD chemotherapy  Heme/Onc history: patient is a 60 year old male with a past medical history significant for hypertension and diabetes among other medical problems.  He was noted to have left-sided neck swelling which has been ongoing for the last 4 months but he feels that the swelling has slowly increased in size.  He was seen by Dr. Kary Kos and underwent CT chest and CT soft tissue neck after initial ultrasound of the neck.  CT showed bulky supraclavicular adenopathy with the largest node measuring 4.1 cm CT chest with contrast also showed similar findings and no evidence of right supraclavicular axillary mediastinal or hilar adenopathy.  Lungs were clear.  Multiple hypodense lesions in the spleen which nearly replaces the splenic parenchyma.  Spleen however does not appear enlarged.    PET CT scan showed FDG avid adenopathy within the lower left neck to left supraclavicular region as well as small bowel mesentery peritoneum and porta hepatic lesions.  Multiple FDG avid splenic lesions.  Mild diffuse increased uptake in the bone marrow nonspecific.  No focal areas of increased uptake to suggest bone metastases.   Excisional supraclavicular lymph node biopsy showed classical Hodgkin's lymphoma. The H&E stained sections reveal lymph node effaced by a somewhat nodular proliferation of large, abnormal lymphoid cells, including "mummified" cells, "lacunar" cells, and occasional binucleated cells with classic Reed-Sternberg morphology, in a background of predominantly small  lymphocytes.  EBV negative  Stage III classical Hodgkin's lymphoma IPI score 2.5-year freedom from progression of 80% and 91% overall survival.  Interval history-patient feels well overall.  Denies any specific complaints at this time  ECOG PS- 0 Pain scale- 0   Review of systems- Review of Systems  Constitutional:  Negative for chills, fever, malaise/fatigue and weight loss.  HENT:  Negative for congestion, ear discharge and nosebleeds.   Eyes:  Negative for blurred vision.  Respiratory:  Negative for cough, hemoptysis, sputum production, shortness of breath and wheezing.   Cardiovascular:  Negative for chest pain, palpitations, orthopnea and claudication.  Gastrointestinal:  Negative for abdominal pain, blood in stool, constipation, diarrhea, heartburn, melena, nausea and vomiting.  Genitourinary:  Negative for dysuria, flank pain, frequency, hematuria and urgency.  Musculoskeletal:  Negative for back pain, joint pain and myalgias.  Skin:  Negative for rash.  Neurological:  Negative for dizziness, tingling, focal weakness, seizures, weakness and headaches.  Endo/Heme/Allergies:  Does not bruise/bleed easily.  Psychiatric/Behavioral:  Negative for depression and suicidal ideas. The patient does not have insomnia.      Allergies  Allergen Reactions   Ultram [Tramadol Hcl] Other (See Comments)    vertigo and passed out     Past Medical History:  Diagnosis Date   Anemia    Colon polyps    Depression    Diabetes mellitus without complication (HCC)    GERD (gastroesophageal reflux disease)    Hemorrhoids    Hyperlipidemia    Hypertension    Pneumonia    Testicular hypofunction    Tinnitus      Past Surgical History:  Procedure Laterality Date   COLONOSCOPY WITH PROPOFOL N/A 04/07/2015  Procedure: COLONOSCOPY WITH PROPOFOL;  Surgeon: Hulen Luster, MD;  Location: [facility] ENDOSCOPY;  Service: Gastroenterology;  Laterality: N/A;   COLONOSCOPY WITH PROPOFOL N/A 09/01/2020    Procedure: COLONOSCOPY WITH PROPOFOL;  Surgeon: Toledo, Benay Pike, MD;  Location: [facility] ENDOSCOPY;  Service: Gastroenterology;  Laterality: N/A;   EYE SURGERY     cataract   MASS BIOPSY Left 05/07/2021   Procedure: NECK MASS BIOPSY,;  Surgeon: Jules Husbands, MD;  Location: [facility] ORS;  Service: General;  Laterality: Left;  Provider requesting 1.5 hours / 90 minutes for procedure.   PORTACATH PLACEMENT Right 05/21/2021   Procedure: INSERTION PORT-A-CATH;  Surgeon: Jules Husbands, MD;  Location: [facility] ORS;  Service: General;  Laterality: Right;    Social History   Socioeconomic History   Marital status: Married    Spouse name: [name]   Number of children: Not on file   Years of education: Not on file   Highest education level: Not on file  Occupational History   Not on file  Tobacco Use   Smoking status: Never   Smokeless tobacco: Never  Vaping Use   Vaping Use: Never used  Substance and Sexual Activity   Alcohol use: Never   Drug use: Never   Sexual activity: Yes  Other Topics Concern   Not on file  Social History Narrative   Not on file   Social Determinants of Health   Financial Resource Strain: Not on file  Food Insecurity: Not on file  Transportation Needs: Not on file  Physical Activity: Not on file  Stress: Not on file  Social Connections: Not on file  Intimate Partner Violence: Not on file    Family History  Problem Relation Age of Onset   Arthritis Mother    Stroke Paternal Aunt      Current Outpatient Medications:    acidophilus (RISAQUAD) CAPS capsule, Take 1 capsule by mouth every evening., Disp: , Rfl:    amLODipine (NORVASC) 5 MG tablet, Take 7.5 mg by mouth daily., Disp: , Rfl:    buPROPion (WELLBUTRIN XL) 150 MG 24 hr tablet, Take 450 mg by mouth daily., Disp: , Rfl:    buPROPion (WELLBUTRIN XL) 150 MG 24 hr tablet, Take 3 tablets by mouth daily., Disp: , Rfl:    Homeopathic Products (LEG CRAMPS) TABS, Take by mouth as needed. Pt taking for leg  cramps., Disp: , Rfl:    ibuprofen (ADVIL,MOTRIN) 200 MG tablet, Take 200-400 mg by mouth every 6 (six) hours as needed for moderate pain., Disp: , Rfl:    lisinopril (PRINIVIL,ZESTRIL) 10 MG tablet, Take 10 mg by mouth daily., Disp: , Rfl:    loratadine (CLARITIN) 10 MG tablet, Take 10 mg by mouth daily as needed for allergies., Disp: , Rfl:    metFORMIN (GLUCOPHAGE-XR) 500 MG 24 hr tablet, Take 1,000 mg by mouth in the morning and at bedtime., Disp: , Rfl:    simvastatin (ZOCOR) 20 MG tablet, Take 20 mg by mouth every evening., Disp: , Rfl:    sodium chloride (OCEAN) 0.65 % SOLN nasal spray, Place 1 spray into both nostrils as needed for congestion., Disp: , Rfl:    Testosterone 1.62 % GEL, Apply 2 Pump topically in the morning. Applied to shoulders/upper arms, Disp: , Rfl:    Triamcinolone Acetonide (NASACORT AQ NA), Place 2 sprays into both nostrils daily as needed (allergies)., Disp: , Rfl:    dexamethasone (DECADRON) 4 MG tablet, Take 2 tablets by mouth once a day for 3  days after chemo. Take with food. (Patient not taking: Reported on 05/27/2021), Disp: 30 tablet, Rfl: 1   HYDROcodone-acetaminophen (NORCO/VICODIN) 5-325 MG tablet, Take 1-2 tablets by mouth every 6 (six) hours as needed for moderate pain. (Patient not taking: Reported on 05/27/2021), Disp: 12 tablet, Rfl: 0   lidocaine-prilocaine (EMLA) cream, Apply to affected area once, Disp: 30 g, Rfl: 3   LORazepam (ATIVAN) 0.5 MG tablet, Take 1 tablet (0.5 mg total) by mouth every 6 (six) hours as needed (Nausea or vomiting)., Disp: 30 tablet, Rfl: 0   Multiple Vitamin (MULTIVITAMIN PO), Take 1 Package by mouth daily. (Patient not taking: Reported on 05/27/2021), Disp: , Rfl:    ondansetron (ZOFRAN) 4 MG tablet, Take 4 mg by mouth every 6 (six) hours. (Patient not taking: Reported on 05/27/2021), Disp: , Rfl:    ondansetron (ZOFRAN) 8 MG tablet, Take 1 tablet (8 mg total) by mouth 2 (two) times daily as needed. Start on the third day after  chemotherapy. (Patient not taking: Reported on 05/27/2021), Disp: 30 tablet, Rfl: 1   prochlorperazine (COMPAZINE) 10 MG tablet, Take 1 tablet (10 mg total) by mouth every 6 (six) hours as needed (Nausea or vomiting)., Disp: 30 tablet, Rfl: 1   tetrahydrozoline-zinc (VISINE-AC) 0.05-0.25 % ophthalmic solution, Place 2 drops into both eyes 3 (three) times daily as needed (dry eyes). (Patient not taking: Reported on 05/27/2021), Disp: , Rfl:  No current facility-administered medications for this visit.  Facility-Administered Medications Ordered in Other Visits:    brentuximab vedotin (ADCETRIS) 120 mg in sodium chloride 0.9 % 100 mL chemo infusion, 120 mg, Intravenous, Once, Sindy Guadeloupe, MD  Physical exam:  Vitals:   05/27/21 0852  BP: (!) 145/89  Pulse: 84  Resp: 18  Temp: (!) 97 F (36.1 C)  SpO2: 98%  Weight: 246 lb (111.6 kg)   Physical Exam Cardiovascular:     Rate and Rhythm: Normal rate and regular rhythm.     Heart sounds: Normal heart sounds.  Pulmonary:     Effort: Pulmonary effort is normal.     Breath sounds: Normal breath sounds.  Abdominal:     General: Bowel sounds are normal.     Palpations: Abdomen is soft.  Lymphadenopathy:     Comments: Palpable left supraclavicular adenopathy  Skin:    General: Skin is warm and dry.  Neurological:     Mental Status: He is alert and oriented to person, place, and time.     CMP Latest Ref Rng & Units 05/27/2021  Glucose 70 - 99 mg/dL 173(H)  BUN 6 - 20 mg/dL 17  Creatinine 0.61 - 1.24 mg/dL 0.99  Sodium 135 - 145 mmol/L 135  Potassium 3.5 - 5.1 mmol/L 4.0  Chloride 98 - 111 mmol/L 99  CO2 22 - 32 mmol/L 24  Calcium 8.9 - 10.3 mg/dL 9.1  Total Protein 6.5 - 8.1 g/dL 7.6  Total Bilirubin 0.3 - 1.2 mg/dL 0.3  Alkaline Phos 38 - 126 U/L 86  AST 15 - 41 U/L 27  ALT 0 - 44 U/L 40   CBC Latest Ref Rng & Units 05/27/2021  WBC 4.0 - 10.5 K/uL 7.1  Hemoglobin 13.0 - 17.0 g/dL 11.5(L)  Hematocrit 39.0 - 52.0 % 35.2(L)   Platelets 150 - 400 K/uL 275    No images are attached to the encounter.  X-ray chest PA or AP  Result Date: 05/21/2021 CLINICAL DATA:  Status post Port-A-Cath placement. EXAM: CHEST  1 VIEW COMPARISON:  None. FINDINGS:  The heart size and mediastinal contours are within normal limits. Both lungs are clear. Interval placement of right internal jugular Port-A-Cath with distal tip in expected position of the SVC. No pneumothorax is noted. The visualized skeletal structures are unremarkable. IMPRESSION: Interval placement of right internal jugular Port-A-Cath with distal tip in expected position of the SVC. Electronically Signed   By: Marijo Conception M.D.   On: 05/21/2021 17:54   NM PET Image Initial (PI) Skull Base To Thigh (F-18 FDG)  Result Date: 05/08/2021 CLINICAL DATA:  Initial treatment strategy for neck mass. EXAM: NUCLEAR MEDICINE PET SKULL BASE TO THIGH TECHNIQUE: 12.9 mCi F-18 FDG was injected intravenously. Full-ring PET imaging was performed from the skull base to thigh after the radiotracer. CT data was obtained and used for attenuation correction and anatomic localization. Fasting blood glucose: 88 mg/dl COMPARISON:  04/16/2021 FINDINGS: Mediastinal blood pool activity: SUV max 3.22 Liver activity: SUV max NA NECK: Large FDG avid nodal mass within the left lower neck/left supraclavicular region is noted. Index lymph node measures 3.5 x 2.7 cm and has an SUV max 10.05, image 61/3. Incidental CT findings: none CHEST: Within the superior mediastinum there is a small left pre-vascular lymph node measuring 9 mm with SUV max 3.69, image 74/3. Lungs are clear.  No FDG avid pulmonary nodules. Incidental CT findings: Heart size is upper limits of normal. Lad coronary artery calcifications noted. ABDOMEN/PELVIS: No abnormal FDG uptake identified within the liver or pancreas. The spleen measures 11 cm in cranial caudal dimension. Multiple FDG avid splenic lesions are identified with SUV max 15.15.  Multifocal FDG avid abdominal lymph nodes are identified, including: Within the gastrohepatic ligament there is a 1.5 cm lymph node within SUV max of 10.85, image 150/3. Pre caval lymph node measures 1.8 cm with SUV max of 12.3, image 157/3. FDG avid left retroperitoneal lymph node measures 1.3 cm within SUV max of 9.52. FDG avid aortocaval node measures 1.2 cm with SUV max of 7.25. Conglomeration of jejunal mesenteric lymph nodes within the left side of the small bowel mesentery measures 6.1 x 5.8 cm with SUV max 14.57. No FDG avid pelvic or inguinal lymph nodes. Incidental CT findings: Several cysts are noted within the right and left lobes of liver. SKELETON: Mild diffuse increased uptake within the bone marrow is identified, nonspecific. No focal areas above background bone marrow activity identified for reference purposes SUV max within the L3 vertebral body is equal to 4.4. SUV max within the right iliac bone is equal to 3.37. Incidental CT findings: none IMPRESSION: 1. Examination is positive for FDG avid adenopathy within the left lower neck and left supraclavicular region. There is also FDG avid adenopathy within the small bowel mesentery, retroperitoneum and porta hepatic regions. Multiple FDG avid splenic lesions are also identified. Findings are concerning for lymphoproliferative disorder. Correlation with tissue sampling is advised. The left supraclavicular nodal mass should be easily amenable to percutaneous core biopsy with fine needle aspiration under ultrasound guidance. 2. Mild diffuse increased uptake within the bone marrow is noted, nonspecific. No focal areas increased uptake to suggest bone metastases. Electronically Signed   By: Kerby Moors M.D.   On: 05/08/2021 12:03   DG C-Arm 1-60 Min-No Report  Result Date: 05/21/2021 Fluoroscopy was utilized by the requesting physician.  No radiographic interpretation.   ECHOCARDIOGRAM COMPLETE  Result Date: 05/21/2021    ECHOCARDIOGRAM  REPORT   Patient Name:   AVONTE SENSABAUGH Date of Exam: 05/21/2021 Medical Rec #:  [id number]           Height:       71.0 in Accession #:    [id number]          Weight:       246.0 lb Date of Birth:  [birth year]           BSA:          2.302 m Patient Age:    33 years            BP:           131/77 mmHg Patient Gender: M                   HR:           60 bpm. Exam Location:  [facility] Procedure: 2D Echo, Color Doppler, Cardiac Doppler and Strain Analysis Indications:     Supraclavicular adenopathy  History:         Patient has no prior history of Echocardiogram examinations.                  Risk Factors:Hypertension and Diabetes.  Sonographer:     Charmayne Sheer Referring Phys:  [id number] Weston Anna Lilianne Delair Diagnosing Phys: Ida Rogue MD  Sonographer Comments: No subcostal window. Global longitudinal strain was attempted. IMPRESSIONS  1. Left ventricular ejection fraction, by estimation, is 60 to 65%. The left ventricle has normal function. The left ventricle has no regional wall motion abnormalities. Left ventricular diastolic parameters are consistent with Grade I diastolic dysfunction (impaired relaxation).  2. Right ventricular systolic function is normal. The right ventricular size is normal.  3. Left atrial size was mildly dilated.  4. The mitral valve is normal in structure. Mild mitral valve regurgitation. No evidence of mitral stenosis.  5. The aortic valve was not well visualized. Aortic valve regurgitation is not visualized. No aortic stenosis is present.  6. The inferior vena cava is normal in size with greater than 50% respiratory variability, suggesting right atrial pressure of 3 mmHg. FINDINGS  Left Ventricle: Left ventricular ejection fraction, by estimation, is 60 to 65%. The left ventricle has normal function. The left ventricle has no regional wall motion abnormalities. The left ventricular internal cavity size was normal in size. There is  no left ventricular hypertrophy. Left ventricular diastolic  parameters are consistent with Grade I diastolic dysfunction (impaired relaxation). Right Ventricle: The right ventricular size is normal. No increase in right ventricular wall thickness. Right ventricular systolic function is normal. Left Atrium: Left atrial size was mildly dilated. Right Atrium: Right atrial size was normal in size. Pericardium: There is no evidence of pericardial effusion. Mitral Valve: The mitral valve is normal in structure. Mild mitral valve regurgitation. No evidence of mitral valve stenosis. MV peak gradient, 1.9 mmHg. The mean mitral valve gradient is 1.0 mmHg. Tricuspid Valve: The tricuspid valve is normal in structure. Tricuspid valve regurgitation is not demonstrated. No evidence of tricuspid stenosis. Aortic Valve: The aortic valve was not well visualized. Aortic valve regurgitation is not visualized. No aortic stenosis is present. Aortic valve mean gradient measures 4.0 mmHg. Aortic valve peak gradient measures 8.1 mmHg. Aortic valve area, by VTI measures 4.43 cm. Pulmonic Valve: The pulmonic valve was normal in structure. Pulmonic valve regurgitation is not visualized. No evidence of pulmonic stenosis. Aorta: The aortic root is normal in size and structure. Venous: The inferior vena cava is normal in size with greater than 50% respiratory variability, suggesting right atrial pressure  of 3 mmHg. IAS/Shunts: No atrial level shunt detected by color flow Doppler.  LEFT VENTRICLE PLAX 2D LVIDd:         5.50 cm   Diastology LVIDs:         3.30 cm   LV e' medial:    8.16 cm/s LV PW:         1.20 cm   LV E/e' medial:  8.3 LV IVS:        0.80 cm   LV e' lateral:   7.94 cm/s LVOT diam:     2.70 cm   LV E/e' lateral: 8.6 LV SV:         136 LV SV Index:   59 LVOT Area:     5.73 cm  RIGHT VENTRICLE RV Basal diam:  4.20 cm LEFT ATRIUM             Index        RIGHT ATRIUM           Index LA diam:        4.30 cm 1.87 cm/m   RA Area:     19.00 cm LA Vol (A2C):   80.5 ml 34.96 ml/m  RA Volume:    52.80 ml  22.93 ml/m LA Vol (A4C):   96.7 ml 42.00 ml/m LA Biplane Vol: 92.1 ml 40.00 ml/m  AORTIC VALVE                    PULMONIC VALVE AV Area (Vmax):    4.39 cm     PV Vmax:       0.96 m/s AV Area (Vmean):   4.61 cm     PV Vmean:      62.000 cm/s AV Area (VTI):     4.43 cm     PV VTI:        0.190 m AV Vmax:           142.00 cm/s  PV Peak grad:  3.7 mmHg AV Vmean:          92.100 cm/s  PV Mean grad:  2.0 mmHg AV VTI:            0.306 m AV Peak Grad:      8.1 mmHg AV Mean Grad:      4.0 mmHg LVOT Vmax:         109.00 cm/s LVOT Vmean:        74.100 cm/s LVOT VTI:          0.237 m LVOT/AV VTI ratio: 0.77  AORTA Ao Root diam: 4.30 cm MITRAL VALVE MV Area (PHT): 3.26 cm    SHUNTS MV Area VTI:   6.20 cm    Systemic VTI:  0.24 m MV Peak grad:  1.9 mmHg    Systemic Diam: 2.70 cm MV Mean grad:  1.0 mmHg MV Vmax:       0.69 m/s MV Vmean:      40.1 cm/s MV Decel Time: 233 msec MV E velocity: 68.10 cm/s MV A velocity: 56.60 cm/s MV E/A ratio:  1.20 Ida Rogue MD Electronically signed by Ida Rogue MD Signature Date/Time: 05/21/2021/12:36:40 PM    Final      Assessment and plan- Patient is a 60 y.o. male with stage III classical Hodgkin's lymphoma here for on treatment assessment prior to cycle 1 day 1 of BV AVD chemotherapy  Counts okay to proceed with cycle 1 day 1 of BV AVD chemotherapy today.  Ellen Henri  on day 3.  I will see him back in 2 weeks for cycle 1 day 15 of BV AVD chemotherapy.  Again discussed risks and benefits of chemotherapy including all but not limited to nausea, vomiting, low blood counts, risk of infections and hospitalizations.  Risk of peripheral neuropathy associated with rituximab.  Baseline echocardiogram normal and therefore okay to proceed with doxorubicin today.  Treatment will be given with a curative intent.  Patient understands and agrees to proceed as planned   Visit Diagnosis 1. Encounter for antineoplastic chemotherapy   2. 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 Randa Evens, MD, MPH [facility] [phone] 05/27/2021 12:23 PM

## 2021-05-27 NOTE — Patient Instructions (Signed)
[facility] [place] [facility]  Discharge Instructions: Thank you for choosing [facility] to provide your oncology and hematology care.  If you have a lab appointment with the Cancer Center, please go directly to the Cancer Center and check in at the registration area.  Wear comfortable clothing and clothing appropriate for easy access to any Portacath or PICC line.   We strive to give you quality time with your provider. You may need to reschedule your appointment if you arrive late (15 or more minutes).  Arriving late affects you and other patients whose appointments are after yours.  Also, if you miss three or more appointments without notifying the office, you may be dismissed from the clinic at the provider's discretion.      For prescription refill requests, have your pharmacy contact our office and allow 72 hours for refills to be completed.      To help prevent nausea and vomiting after your treatment, we encourage you to take your nausea medication as directed.  BELOW ARE SYMPTOMS THAT SHOULD BE REPORTED IMMEDIATELY: *FEVER GREATER THAN 100.4 F (38 C) OR HIGHER *CHILLS OR SWEATING *NAUSEA AND VOMITING THAT IS NOT CONTROLLED WITH YOUR NAUSEA MEDICATION *UNUSUAL SHORTNESS OF BREATH *UNUSUAL BRUISING OR BLEEDING *URINARY PROBLEMS (pain or burning when urinating, or frequent urination) *BOWEL PROBLEMS (unusual diarrhea, constipation, pain near the anus) TENDERNESS IN MOUTH AND THROAT WITH OR WITHOUT PRESENCE OF ULCERS (sore throat, sores in mouth, or a toothache) UNUSUAL RASH, SWELLING OR PAIN  UNUSUAL VAGINAL DISCHARGE OR ITCHING   Items with * indicate a potential emergency and should be followed up as soon as possible or go to the Emergency Department if any problems should occur.  Please show the CHEMOTHERAPY ALERT CARD or IMMUNOTHERAPY ALERT CARD at check-in to the Emergency Department and triage nurse.  Should you have questions after your  visit or need to cancel or reschedule your appointment, please contact [facility] [place] [facility]  [phone] and follow the prompts.  Office hours are 8:00 a.m. to 4:30 p.m. Monday - Friday. Please note that voicemails left after 4:00 p.m. may not be returned until the following business day.  We are closed weekends and major holidays. You have access to a nurse at all times for urgent questions. Please call the main number to the clinic [phone] and follow the prompts.  For any non-urgent questions, you may also contact your provider using MyChart. We now offer e-Visits for anyone 18 and older to request care online for non-urgent symptoms. For details visit [email or web address].   Also download the MyChart app! Go to the app store, search "MyChart", open the app, select [facility], and log in with your MyChart username and password.  Due to Covid, a mask is required upon entering the hospital/clinic. If you do not have a mask, one will be given to you upon arrival. For doctor visits, patients may have 1 support person aged 18 or older with them. For treatment visits, patients cannot have anyone with them due to current Covid guidelines and our immunocompromised population.  

## 2021-05-27 NOTE — Progress Notes (Signed)
Pt has been experiencing frequent leg cramps: has a medication he will like to discuss "Homeopathic Leg Cramps" before starting OTC medication. As well as, discuss taking Claritin too often: at times pt is taking up to 4 a day due to sinus drainage.

## 2021-05-28 ENCOUNTER — Telehealth: Payer: Self-pay

## 2021-05-28 NOTE — Telephone Encounter (Signed)
Telephone call to patient for follow up after receiving first infusion.   Patient states infusion went great.  States eating good and drinking plenty of fluids.   Denies any nausea or vomiting.  Encouraged patient to call for any concerns or questions. 

## 2021-05-29 ENCOUNTER — Other Ambulatory Visit: Payer: Self-pay

## 2021-05-29 ENCOUNTER — Inpatient Hospital Stay: Payer: 59

## 2021-05-29 DIAGNOSIS — C8190 Hodgkin lymphoma, unspecified, unspecified site: Secondary | ICD-10-CM | POA: Diagnosis not present

## 2021-05-29 DIAGNOSIS — C8178 Other classical Hodgkin lymphoma, lymph nodes of multiple sites: Secondary | ICD-10-CM

## 2021-05-29 MED ORDER — PEGFILGRASTIM-CBQV 6 MG/0.6ML ~~LOC~~ SOSY
6.0000 mg | PREFILLED_SYRINGE | Freq: Once | SUBCUTANEOUS | Status: AC
  Administered 2021-05-29: 6 mg via SUBCUTANEOUS
  Filled 2021-05-29: qty 0.6

## 2021-06-01 ENCOUNTER — Inpatient Hospital Stay (HOSPITAL_BASED_OUTPATIENT_CLINIC_OR_DEPARTMENT_OTHER): Payer: 59 | Admitting: Nurse Practitioner

## 2021-06-01 DIAGNOSIS — D849 Immunodeficiency, unspecified: Secondary | ICD-10-CM

## 2021-06-01 NOTE — Progress Notes (Signed)
Pt confirmed availability for telephone visit as scheduled. No concerns/complaints at this time.

## 2021-06-01 NOTE — Progress Notes (Signed)
[place] [facility]  Virtual Visit Progress Note for Evusheld   I connected with Erik Buchanan on 06/01/21 at  4:30 PM EST by telephone visit and verified that I am speaking with the correct person using two identifiers.   I discussed the limitations, risks, security and privacy concerns of performing an evaluation and management service by telemedicine and the availability of in-person appointments. I also discussed with the patient that there may be a patient responsible charge related to this service. The patient expressed understanding and agreed to proceed.   Other persons participating in the visit and their role in the encounter: none   Patient's location: home  Provider's location: clinic  Chief Complaint: Evusheld     Patient Care Team: Erik Pink, MD as PCP - General (Family Medicine)   Name of the patient: Erik Buchanan  [id number]  May 19, 1961   Date of visit: 06/01/21  History of Presenting Illness- Patient present for evaluation and consideration of Evusheld. Patient is considered moderately to severely immunocompromised based on active cancer treatment or active cancer, history of organ transplant, having received a stem cell transplant within the last 2 years and taking immunosuppression, or active treatment with immunosuppressing medications.   Review of systems- Review of Systems  Constitutional:  Negative for chills, diaphoresis, fever, malaise/fatigue and weight loss.  HENT:  Negative for congestion, ear discharge, ear pain, nosebleeds, sinus pain and sore throat.   Eyes:  Negative for pain and redness.  Respiratory:  Negative for cough, hemoptysis, sputum production, shortness of breath, wheezing and stridor.   Cardiovascular:  Negative for chest pain, palpitations and leg swelling.  Gastrointestinal:  Negative for abdominal pain, constipation, diarrhea, nausea and vomiting.  Musculoskeletal:  Negative for falls, joint pain and  myalgias.  Skin:  Negative for rash.  Neurological:  Negative for dizziness, loss of consciousness, weakness and headaches.  Psychiatric/Behavioral:  Negative for depression. The patient is not nervous/anxious and does not have insomnia.   All other systems reviewed and are negative.   Allergies  Allergen Reactions   Ultram [Tramadol Hcl] Other (See Comments)    vertigo and passed out    Past Medical History:  Diagnosis Date   Anemia    Colon polyps    Depression    Diabetes mellitus without complication (HCC)    GERD (gastroesophageal reflux disease)    Hemorrhoids    Hyperlipidemia    Hypertension    Pneumonia    Testicular hypofunction    Tinnitus     Past Surgical History:  Procedure Laterality Date   COLONOSCOPY WITH PROPOFOL N/A 04/07/2015   Procedure: COLONOSCOPY WITH PROPOFOL;  Surgeon: Hulen Luster, MD;  Location: [facility] ENDOSCOPY;  Service: Gastroenterology;  Laterality: N/A;   COLONOSCOPY WITH PROPOFOL N/A 09/01/2020   Procedure: COLONOSCOPY WITH PROPOFOL;  Surgeon: Toledo, Benay Pike, MD;  Location: [facility] ENDOSCOPY;  Service: Gastroenterology;  Laterality: N/A;   EYE SURGERY     cataract   MASS BIOPSY Left 05/07/2021   Procedure: NECK MASS BIOPSY,;  Surgeon: Jules Husbands, MD;  Location: [facility] ORS;  Service: General;  Laterality: Left;  Provider requesting 1.5 hours / 90 minutes for procedure.   PORTACATH PLACEMENT Right 05/21/2021   Procedure: INSERTION PORT-A-CATH;  Surgeon: Jules Husbands, MD;  Location: [facility] ORS;  Service: General;  Laterality: Right;    Social History   Socioeconomic History   Marital status: Married    Spouse name: Erik Buchanan   Number of children: Not on file  Years of education: Not on file   Highest education level: Not on file  Occupational History   Not on file  Tobacco Use   Smoking status: Never   Smokeless tobacco: Never  Vaping Use   Vaping Use: Never used  Substance and Sexual Activity   Alcohol use: Never   Drug use: Never    Sexual activity: Yes  Other Topics Concern   Not on file  Social History Narrative   Not on file   Social Determinants of Health   Financial Resource Strain: Not on file  Food Insecurity: Not on file  Transportation Needs: Not on file  Physical Activity: Not on file  Stress: Not on file  Social Connections: Not on file  Intimate Partner Violence: Not on file     There is no immunization history on file for this patient.  Family History  Problem Relation Age of Onset   Arthritis Mother    Stroke Paternal Aunt      Current Outpatient Medications:    acidophilus (RISAQUAD) CAPS capsule, Take 1 capsule by mouth every evening., Disp: , Rfl:    amLODipine (NORVASC) 5 MG tablet, Take 7.5 mg by mouth daily., Disp: , Rfl:    buPROPion (WELLBUTRIN XL) 150 MG 24 hr tablet, Take 450 mg by mouth daily., Disp: , Rfl:    buPROPion (WELLBUTRIN XL) 150 MG 24 hr tablet, Take 3 tablets by mouth daily., Disp: , Rfl:    Homeopathic Products (LEG CRAMPS) TABS, Take by mouth as needed. Pt taking for leg cramps., Disp: , Rfl:    ibuprofen (ADVIL,MOTRIN) 200 MG tablet, Take 200-400 mg by mouth every 6 (six) hours as needed for moderate pain., Disp: , Rfl:    lidocaine-prilocaine (EMLA) cream, Apply to affected area once, Disp: 30 g, Rfl: 3   lisinopril (PRINIVIL,ZESTRIL) 10 MG tablet, Take 10 mg by mouth daily., Disp: , Rfl:    loratadine (CLARITIN) 10 MG tablet, Take 10 mg by mouth daily as needed for allergies., Disp: , Rfl:    LORazepam (ATIVAN) 0.5 MG tablet, Take 1 tablet (0.5 mg total) by mouth every 6 (six) hours as needed (Nausea or vomiting)., Disp: 30 tablet, Rfl: 0   metFORMIN (GLUCOPHAGE-XR) 500 MG 24 hr tablet, Take 1,000 mg by mouth in the morning and at bedtime., Disp: , Rfl:    Multiple Vitamin (MULTIVITAMIN PO), Take 1 Package by mouth daily., Disp: , Rfl:    ondansetron (ZOFRAN) 4 MG tablet, Take 4 mg by mouth every 6 (six) hours., Disp: , Rfl:    ondansetron (ZOFRAN) 8 MG tablet,  Take 1 tablet (8 mg total) by mouth 2 (two) times daily as needed. Start on the third day after chemotherapy., Disp: 30 tablet, Rfl: 1   prochlorperazine (COMPAZINE) 10 MG tablet, Take 1 tablet (10 mg total) by mouth every 6 (six) hours as needed (Nausea or vomiting)., Disp: 30 tablet, Rfl: 1   simvastatin (ZOCOR) 20 MG tablet, Take 20 mg by mouth every evening., Disp: , Rfl:    sodium chloride (OCEAN) 0.65 % SOLN nasal spray, Place 1 spray into both nostrils as needed for congestion., Disp: , Rfl:    Testosterone 1.62 % GEL, Apply 2 Pump topically in the morning. Applied to shoulders/upper arms, Disp: , Rfl:    tetrahydrozoline-zinc (VISINE-AC) 0.05-0.25 % ophthalmic solution, Place 2 drops into both eyes 3 (three) times daily as needed (dry eyes)., Disp: , Rfl:    Triamcinolone Acetonide (NASACORT AQ NA), Place 2 sprays into  both nostrils daily as needed (allergies)., Disp: , Rfl:    dexamethasone (DECADRON) 4 MG tablet, Take 2 tablets by mouth once a day for 3 days after chemo. Take with food. (Patient not taking: Reported on 05/27/2021), Disp: 30 tablet, Rfl: 1   HYDROcodone-acetaminophen (NORCO/VICODIN) 5-325 MG tablet, Take 1-2 tablets by mouth every 6 (six) hours as needed for moderate pain. (Patient not taking: Reported on 05/27/2021), Disp: 12 tablet, Rfl: 0  Physical exam: Exam limited due to telemedicine Physical Exam Neurological:     Mental Status: Erik Buchanan is alert and oriented to person, place, and time.  Psychiatric:        Mood and Affect: Mood normal.        Behavior: Behavior normal.      Assessment and plan- Patient is a 60 y.o. male who presents to consideration of Evusheld.   We discussed that patient was referred to receive Evusheld (Tixagevimab and Cilgavimab). The FDA has issued an emergency use authorization (EUA) for this injection. Evusheld is a pre-exposure prevention of COVID-19 infection in adults who are at high risk for severe disease. Today we reviewed patient's risk  and eligibility for Evusheld. We discussed that consent is voluntary and patient can refuse at any time. An FDA fact sheet will be given to patient prior to injection. We reviewed potential risks, benefits, and alternatives today. We reviewed that some risks and benefits are unknown. Patient verbalizes consent and wishes to proceed.   Advised not to receive a covid vaccine until 2 weeks after receiving Evusheld. Advised that patient should receive Evusheld every 6 months. Advised that receiving Evusheld is not a guarantee in the prevention of getting Covid-19 and that patient should still follow other infection prevention measures.   Return to clinic  Plan for evusheld on 11/30 when patient is scheduled to be at clinic d/t preference. Plan to see him back in 6 months for re-evaluation and consideration of evusheld.    Visit Diagnosis 1. Immunocompromised ([place])     I discussed the assessment and treatment plan with the patient. The patient was provided an opportunity to ask questions and all were answered. The patient agreed with the plan and demonstrated an understanding of the instructions.   The patient was advised to call back or seek an in-person evaluation if the symptoms worsen or if the condition fails to improve as anticipated.   I spent 15 minutes non- face-to-face video visit time dedicated to the care of this patient on the date of this encounter to include pre-visit review of medical oncology, face-to-face time with the patient, and post visit ordering of testing/documentation.    Beckey Rutter, DNP, AGNP-C [place] at [facility] [phone] (clinic)  CC: Dr Janese Banks & Dr. Kary Kos

## 2021-06-09 NOTE — Progress Notes (Signed)
Hematology/Oncology Consult note [facility]  Telephone:([phone] [phone]  Patient Care Team: [place] Pink, MD as PCP - General (Family Medicine)   Name of the patient: Erik Buchanan  [id number]  August 20, 1960   Date of visit: 06/09/21  Diagnosis-stage III classical Hodgkin's lymphoma  Chief complaint/ Reason for visit-on treatment assessment prior to cycle 1 day 15 of BV AVD chemotherapy  Heme/Onc history: patient is a 60 year old male with a past medical history significant for hypertension and diabetes among other medical problems.  He was noted to have left-sided neck swelling which has been ongoing for the last 4 months but he feels that the swelling has slowly increased in size.  He was seen by Dr. Kary Kos and underwent CT chest and CT soft tissue neck after initial ultrasound of the neck.  CT showed bulky supraclavicular adenopathy with the largest node measuring 4.1 cm CT chest with contrast also showed similar findings and no evidence of right supraclavicular axillary mediastinal or hilar adenopathy.  Lungs were clear.  Multiple hypodense lesions in the spleen which nearly replaces the splenic parenchyma.  Spleen however does not appear enlarged.    PET CT scan showed FDG avid adenopathy within the lower left neck to left supraclavicular region as well as small bowel mesentery peritoneum and porta hepatic lesions.  Multiple FDG avid splenic lesions.  Mild diffuse increased uptake in the bone marrow nonspecific.  No focal areas of increased uptake to suggest bone metastases.   Excisional supraclavicular lymph node biopsy showed classical Hodgkin's lymphoma. The H&E stained sections reveal lymph node effaced by a somewhat nodular proliferation of large, abnormal lymphoid cells, including "mummified" cells, "lacunar" cells, and occasional binucleated cells with classic Reed-Sternberg morphology, in a background of predominantly small  lymphocytes.  EBV negative   Stage III classical Hodgkin's lymphoma IPI score 2.5-year freedom from progression of 80% and 91% overall survival.  Interval history-blood sugars have been running high mostly in the 200s to 300s.  Occasional nausea controlled by nausea medications.  ECOG PS- 1 Pain scale- 0   Review of systems- Review of Systems  Constitutional:  Negative for chills, fever, malaise/fatigue and weight loss.  HENT:  Negative for congestion, ear discharge and nosebleeds.   Eyes:  Negative for blurred vision.  Respiratory:  Negative for cough, hemoptysis, sputum production, shortness of breath and wheezing.   Cardiovascular:  Negative for chest pain, palpitations, orthopnea and claudication.  Gastrointestinal:  Positive for nausea. Negative for abdominal pain, blood in stool, constipation, diarrhea, heartburn, melena and vomiting.  Genitourinary:  Negative for dysuria, flank pain, frequency, hematuria and urgency.  Musculoskeletal:  Negative for back pain, joint pain and myalgias.  Skin:  Negative for rash.  Neurological:  Negative for dizziness, tingling, focal weakness, seizures, weakness and headaches.  Endo/Heme/Allergies:  Does not bruise/bleed easily.  Psychiatric/Behavioral:  Negative for depression and suicidal ideas. The patient does not have insomnia.       Allergies  Allergen Reactions   Ultram [Tramadol Hcl] Other (See Comments)    vertigo and passed out     Past Medical History:  Diagnosis Date   Anemia    Colon polyps    Depression    Diabetes mellitus without complication (HCC)    GERD (gastroesophageal reflux disease)    Hemorrhoids    Hyperlipidemia    Hypertension    Pneumonia    Testicular hypofunction    Tinnitus      Past Surgical History:  Procedure Laterality  Date   COLONOSCOPY WITH PROPOFOL N/A 04/07/2015   Procedure: COLONOSCOPY WITH PROPOFOL;  Surgeon: Hulen Luster, MD;  Location: [facility] ENDOSCOPY;  Service: Gastroenterology;   Laterality: N/A;   COLONOSCOPY WITH PROPOFOL N/A 09/01/2020   Procedure: COLONOSCOPY WITH PROPOFOL;  Surgeon: Toledo, Benay Pike, MD;  Location: [facility] ENDOSCOPY;  Service: Gastroenterology;  Laterality: N/A;   EYE SURGERY     cataract   MASS BIOPSY Left 05/07/2021   Procedure: NECK MASS BIOPSY,;  Surgeon: Jules Husbands, MD;  Location: [facility] ORS;  Service: General;  Laterality: Left;  Provider requesting 1.5 hours / 90 minutes for procedure.   PORTACATH PLACEMENT Right 05/21/2021   Procedure: INSERTION PORT-A-CATH;  Surgeon: Jules Husbands, MD;  Location: [facility] ORS;  Service: General;  Laterality: Right;    Social History   Socioeconomic History   Marital status: Married    Spouse name: [name]   Number of children: Not on file   Years of education: Not on file   Highest education level: Not on file  Occupational History   Not on file  Tobacco Use   Smoking status: Never   Smokeless tobacco: Never  Vaping Use   Vaping Use: Never used  Substance and Sexual Activity   Alcohol use: Never   Drug use: Never   Sexual activity: Yes  Other Topics Concern   Not on file  Social History Narrative   Not on file   Social Determinants of Health   Financial Resource Strain: Not on file  Food Insecurity: Not on file  Transportation Needs: Not on file  Physical Activity: Not on file  Stress: Not on file  Social Connections: Not on file  Intimate Partner Violence: Not on file    Family History  Problem Relation Age of Onset   Arthritis Mother    Stroke Paternal Aunt      Current Outpatient Medications:    acidophilus (RISAQUAD) CAPS capsule, Take 1 capsule by mouth every evening., Disp: , Rfl:    amLODipine (NORVASC) 5 MG tablet, Take 7.5 mg by mouth daily., Disp: , Rfl:    buPROPion (WELLBUTRIN XL) 150 MG 24 hr tablet, Take 450 mg by mouth daily., Disp: , Rfl:    buPROPion (WELLBUTRIN XL) 150 MG 24 hr tablet, Take 3 tablets by mouth daily., Disp: , Rfl:    dexamethasone (DECADRON)  4 MG tablet, Take 2 tablets by mouth once a day for 3 days after chemo. Take with food. (Patient not taking: Reported on 05/27/2021), Disp: 30 tablet, Rfl: 1   Homeopathic Products (LEG CRAMPS) TABS, Take by mouth as needed. Pt taking for leg cramps., Disp: , Rfl:    HYDROcodone-acetaminophen (NORCO/VICODIN) 5-325 MG tablet, Take 1-2 tablets by mouth every 6 (six) hours as needed for moderate pain. (Patient not taking: Reported on 05/27/2021), Disp: 12 tablet, Rfl: 0   ibuprofen (ADVIL,MOTRIN) 200 MG tablet, Take 200-400 mg by mouth every 6 (six) hours as needed for moderate pain., Disp: , Rfl:    lidocaine-prilocaine (EMLA) cream, Apply to affected area once, Disp: 30 g, Rfl: 3   lisinopril (PRINIVIL,ZESTRIL) 10 MG tablet, Take 10 mg by mouth daily., Disp: , Rfl:    loratadine (CLARITIN) 10 MG tablet, Take 10 mg by mouth daily as needed for allergies., Disp: , Rfl:    LORazepam (ATIVAN) 0.5 MG tablet, Take 1 tablet (0.5 mg total) by mouth every 6 (six) hours as needed (Nausea or vomiting)., Disp: 30 tablet, Rfl: 0   metFORMIN (GLUCOPHAGE-XR) 500  MG 24 hr tablet, Take 1,000 mg by mouth in the morning and at bedtime., Disp: , Rfl:    Multiple Vitamin (MULTIVITAMIN PO), Take 1 Package by mouth daily., Disp: , Rfl:    ondansetron (ZOFRAN) 4 MG tablet, Take 4 mg by mouth every 6 (six) hours., Disp: , Rfl:    ondansetron (ZOFRAN) 8 MG tablet, Take 1 tablet (8 mg total) by mouth 2 (two) times daily as needed. Start on the third day after chemotherapy., Disp: 30 tablet, Rfl: 1   prochlorperazine (COMPAZINE) 10 MG tablet, Take 1 tablet (10 mg total) by mouth every 6 (six) hours as needed (Nausea or vomiting)., Disp: 30 tablet, Rfl: 1   simvastatin (ZOCOR) 20 MG tablet, Take 20 mg by mouth every evening., Disp: , Rfl:    sodium chloride (OCEAN) 0.65 % SOLN nasal spray, Place 1 spray into both nostrils as needed for congestion., Disp: , Rfl:    Testosterone 1.62 % GEL, Apply 2 Pump topically in the morning.  Applied to shoulders/upper arms, Disp: , Rfl:    tetrahydrozoline-zinc (VISINE-AC) 0.05-0.25 % ophthalmic solution, Place 2 drops into both eyes 3 (three) times daily as needed (dry eyes)., Disp: , Rfl:    Triamcinolone Acetonide (NASACORT AQ NA), Place 2 sprays into both nostrils daily as needed (allergies)., Disp: , Rfl:   Physical exam:  Vitals:   06/10/21 0847  BP: 134/78  Pulse: 84  Resp: 18  Temp: 97.6 F (36.4 C)  SpO2: 98%  Weight: 243 lb 4.8 oz (110.4 kg)   Physical Exam Cardiovascular:     Rate and Rhythm: Normal rate and regular rhythm.     Heart sounds: Normal heart sounds.  Pulmonary:     Effort: Pulmonary effort is normal.     Breath sounds: Normal breath sounds.  Abdominal:     General: Bowel sounds are normal.     Palpations: Abdomen is soft.  Lymphadenopathy:     Comments: Left cervical adenopathy decreasing in size  Skin:    General: Skin is warm and dry.  Neurological:     Mental Status: He is alert and oriented to person, place, and time.     CMP Latest Ref Rng & Units 05/27/2021  Glucose 70 - 99 mg/dL 173(H)  BUN 6 - 20 mg/dL 17  Creatinine 0.61 - 1.24 mg/dL 0.99  Sodium 135 - 145 mmol/L 135  Potassium 3.5 - 5.1 mmol/L 4.0  Chloride 98 - 111 mmol/L 99  CO2 22 - 32 mmol/L 24  Calcium 8.9 - 10.3 mg/dL 9.1  Total Protein 6.5 - 8.1 g/dL 7.6  Total Bilirubin 0.3 - 1.2 mg/dL 0.3  Alkaline Phos 38 - 126 U/L 86  AST 15 - 41 U/L 27  ALT 0 - 44 U/L 40   CBC Latest Ref Rng & Units 05/27/2021  WBC 4.0 - 10.5 K/uL 7.1  Hemoglobin 13.0 - 17.0 g/dL 11.5(L)  Hematocrit 39.0 - 52.0 % 35.2(L)  Platelets 150 - 400 K/uL 275    No images are attached to the encounter.  X-ray chest PA or AP  Result Date: 05/21/2021 CLINICAL DATA:  Status post Port-A-Cath placement. EXAM: CHEST  1 VIEW COMPARISON:  None. FINDINGS: The heart size and mediastinal contours are within normal limits. Both lungs are clear. Interval placement of right internal jugular Port-A-Cath  with distal tip in expected position of the SVC. No pneumothorax is noted. The visualized skeletal structures are unremarkable. IMPRESSION: Interval placement of right internal jugular Port-A-Cath with distal tip  in expected position of the SVC. Electronically Signed   By: Marijo Conception M.D.   On: 05/21/2021 17:54   DG C-Arm 1-60 Min-No Report  Result Date: 05/21/2021 Fluoroscopy was utilized by the requesting physician.  No radiographic interpretation.   ECHOCARDIOGRAM COMPLETE  Result Date: 05/21/2021    ECHOCARDIOGRAM REPORT   Patient Name:   EREK KOWAL Date of Exam: 05/21/2021 Medical Rec #:  [id number]           Height:       71.0 in Accession #:    [id number]          Weight:       246.0 lb Date of Birth:  Dec 13, 1960           BSA:          2.302 m Patient Age:    60 years            BP:           131/77 mmHg Patient Gender: M                   HR:           60 bpm. Exam Location:  [facility] Procedure: 2D Echo, Color Doppler, Cardiac Doppler and Strain Analysis Indications:     Supraclavicular adenopathy  History:         Patient has no prior history of Echocardiogram examinations.                  Risk Factors:Hypertension and Diabetes.  Sonographer:     Charmayne Sheer Referring Phys:  [id number] Weston Anna Miakoda Mcmillion Diagnosing Phys: Ida Rogue MD  Sonographer Comments: No subcostal window. Global longitudinal strain was attempted. IMPRESSIONS  1. Left ventricular ejection fraction, by estimation, is 60 to 65%. The left ventricle has normal function. The left ventricle has no regional wall motion abnormalities. Left ventricular diastolic parameters are consistent with Grade I diastolic dysfunction (impaired relaxation).  2. Right ventricular systolic function is normal. The right ventricular size is normal.  3. Left atrial size was mildly dilated.  4. The mitral valve is normal in structure. Mild mitral valve regurgitation. No evidence of mitral stenosis.  5. The aortic valve was not well visualized.  Aortic valve regurgitation is not visualized. No aortic stenosis is present.  6. The inferior vena cava is normal in size with greater than 50% respiratory variability, suggesting right atrial pressure of 3 mmHg. FINDINGS  Left Ventricle: Left ventricular ejection fraction, by estimation, is 60 to 65%. The left ventricle has normal function. The left ventricle has no regional wall motion abnormalities. The left ventricular internal cavity size was normal in size. There is  no left ventricular hypertrophy. Left ventricular diastolic parameters are consistent with Grade I diastolic dysfunction (impaired relaxation). Right Ventricle: The right ventricular size is normal. No increase in right ventricular wall thickness. Right ventricular systolic function is normal. Left Atrium: Left atrial size was mildly dilated. Right Atrium: Right atrial size was normal in size. Pericardium: There is no evidence of pericardial effusion. Mitral Valve: The mitral valve is normal in structure. Mild mitral valve regurgitation. No evidence of mitral valve stenosis. MV peak gradient, 1.9 mmHg. The mean mitral valve gradient is 1.0 mmHg. Tricuspid Valve: The tricuspid valve is normal in structure. Tricuspid valve regurgitation is not demonstrated. No evidence of tricuspid stenosis. Aortic Valve: The aortic valve was not well visualized. Aortic valve regurgitation is not visualized. No aortic stenosis  is present. Aortic valve mean gradient measures 4.0 mmHg. Aortic valve peak gradient measures 8.1 mmHg. Aortic valve area, by VTI measures 4.43 cm. Pulmonic Valve: The pulmonic valve was normal in structure. Pulmonic valve regurgitation is not visualized. No evidence of pulmonic stenosis. Aorta: The aortic root is normal in size and structure. Venous: The inferior vena cava is normal in size with greater than 50% respiratory variability, suggesting right atrial pressure of 3 mmHg. IAS/Shunts: No atrial level shunt detected by color flow  Doppler.  LEFT VENTRICLE PLAX 2D LVIDd:         5.50 cm   Diastology LVIDs:         3.30 cm   LV e' medial:    8.16 cm/s LV PW:         1.20 cm   LV E/e' medial:  8.3 LV IVS:        0.80 cm   LV e' lateral:   7.94 cm/s LVOT diam:     2.70 cm   LV E/e' lateral: 8.6 LV SV:         136 LV SV Index:   59 LVOT Area:     5.73 cm  RIGHT VENTRICLE RV Basal diam:  4.20 cm LEFT ATRIUM             Index        RIGHT ATRIUM           Index LA diam:        4.30 cm 1.87 cm/m   RA Area:     19.00 cm LA Vol (A2C):   80.5 ml 34.96 ml/m  RA Volume:   52.80 ml  22.93 ml/m LA Vol (A4C):   96.7 ml 42.00 ml/m LA Biplane Vol: 92.1 ml 40.00 ml/m  AORTIC VALVE                    PULMONIC VALVE AV Area (Vmax):    4.39 cm     PV Vmax:       0.96 m/s AV Area (Vmean):   4.61 cm     PV Vmean:      62.000 cm/s AV Area (VTI):     4.43 cm     PV VTI:        0.190 m AV Vmax:           142.00 cm/s  PV Peak grad:  3.7 mmHg AV Vmean:          92.100 cm/s  PV Mean grad:  2.0 mmHg AV VTI:            0.306 m AV Peak Grad:      8.1 mmHg AV Mean Grad:      4.0 mmHg LVOT Vmax:         109.00 cm/s LVOT Vmean:        74.100 cm/s LVOT VTI:          0.237 m LVOT/AV VTI ratio: 0.77  AORTA Ao Root diam: 4.30 cm MITRAL VALVE MV Area (PHT): 3.26 cm    SHUNTS MV Area VTI:   6.20 cm    Systemic VTI:  0.24 m MV Peak grad:  1.9 mmHg    Systemic Diam: 2.70 cm MV Mean grad:  1.0 mmHg MV Vmax:       0.69 m/s MV Vmean:      40.1 cm/s MV Decel Time: 233 msec MV E velocity: 68.10 cm/s MV A velocity: 56.60 cm/s MV  E/A ratio:  1.20 Ida Rogue MD Electronically signed by Ida Rogue MD Signature Date/Time: 05/21/2021/12:36:40 PM    Final      Assessment and plan- Patient is a 60 y.o. male with stage III classical Hodgkin's lymphoma here for on treatment assessment prior to cycle 1 day 15 of BV AVD chemotherapy  Counts okay to proceed with cycle 1 day 15 of BV AVD chemotherapy on day 1 and Udenyca on day 3.  He will be seen by covering NP in 2 weeks for  cycle 2-day 1 and I will see him back in 4 weeks for cycle 2-day 15 of BV AVD chemotherapy.  Plan is to complete total 6 cycles.  Tolerating chemotherapy well so far.  We will reach out to his PCP Dr. Chrystine Oiler given that his blood sugars have been running high to see if his outpatient medications can be adjusted and if he needs to be started on insulin at least during chemotherapy.  I will be cutting down Decadron dose in his premedications and have asked him to also take half the dose of Decadron for nausea vomiting prophylaxis.  However the hyperglycemia seems to be more sustained for throughout the 2 weeks even when he comes off Decadron   Visit Diagnosis 1. Encounter for antineoplastic chemotherapy   2. Other classical Hodgkin lymphoma of lymph nodes of neck ([place])      Dr. Randa Evens, MD, MPH [facility] at [facility] [phone] 06/09/2021 6:07 PM

## 2021-06-10 ENCOUNTER — Inpatient Hospital Stay: Payer: 59

## 2021-06-10 ENCOUNTER — Inpatient Hospital Stay (HOSPITAL_BASED_OUTPATIENT_CLINIC_OR_DEPARTMENT_OTHER): Payer: 59 | Admitting: Oncology

## 2021-06-10 ENCOUNTER — Encounter: Payer: Self-pay | Admitting: Oncology

## 2021-06-10 ENCOUNTER — Other Ambulatory Visit: Payer: Self-pay

## 2021-06-10 VITALS — BP 141/67 | HR 69 | Temp 96.2°F | Resp 18

## 2021-06-10 VITALS — BP 134/78 | HR 84 | Temp 97.6°F | Resp 18 | Wt 243.3 lb

## 2021-06-10 DIAGNOSIS — C8171 Other classical Hodgkin lymphoma, lymph nodes of head, face, and neck: Secondary | ICD-10-CM | POA: Diagnosis not present

## 2021-06-10 DIAGNOSIS — C8190 Hodgkin lymphoma, unspecified, unspecified site: Secondary | ICD-10-CM | POA: Diagnosis not present

## 2021-06-10 DIAGNOSIS — D849 Immunodeficiency, unspecified: Secondary | ICD-10-CM

## 2021-06-10 DIAGNOSIS — Z5111 Encounter for antineoplastic chemotherapy: Secondary | ICD-10-CM | POA: Diagnosis not present

## 2021-06-10 DIAGNOSIS — C8178 Other classical Hodgkin lymphoma, lymph nodes of multiple sites: Secondary | ICD-10-CM

## 2021-06-10 DIAGNOSIS — Z95828 Presence of other vascular implants and grafts: Secondary | ICD-10-CM

## 2021-06-10 LAB — CBC WITH DIFFERENTIAL/PLATELET
Abs Immature Granulocytes: 0.39 10*3/uL — ABNORMAL HIGH (ref 0.00–0.07)
Basophils Absolute: 0.1 10*3/uL (ref 0.0–0.1)
Basophils Relative: 1 %
Eosinophils Absolute: 0.1 10*3/uL (ref 0.0–0.5)
Eosinophils Relative: 1 %
HCT: 33.5 % — ABNORMAL LOW (ref 39.0–52.0)
Hemoglobin: 11 g/dL — ABNORMAL LOW (ref 13.0–17.0)
Immature Granulocytes: 4 %
Lymphocytes Relative: 14 %
Lymphs Abs: 1.3 10*3/uL (ref 0.7–4.0)
MCH: 26.1 pg (ref 26.0–34.0)
MCHC: 32.8 g/dL (ref 30.0–36.0)
MCV: 79.4 fL — ABNORMAL LOW (ref 80.0–100.0)
Monocytes Absolute: 0.6 10*3/uL (ref 0.1–1.0)
Monocytes Relative: 6 %
Neutro Abs: 7.5 10*3/uL (ref 1.7–7.7)
Neutrophils Relative %: 74 %
Platelets: 165 10*3/uL (ref 150–400)
RBC: 4.22 MIL/uL (ref 4.22–5.81)
RDW: 14.2 % (ref 11.5–15.5)
WBC: 9.9 10*3/uL (ref 4.0–10.5)
nRBC: 0 % (ref 0.0–0.2)

## 2021-06-10 LAB — COMPREHENSIVE METABOLIC PANEL
ALT: 31 U/L (ref 0–44)
AST: 21 U/L (ref 15–41)
Albumin: 3.9 g/dL (ref 3.5–5.0)
Alkaline Phosphatase: 92 U/L (ref 38–126)
Anion gap: 12 (ref 5–15)
BUN: 14 mg/dL (ref 6–20)
CO2: 23 mmol/L (ref 22–32)
Calcium: 8.6 mg/dL — ABNORMAL LOW (ref 8.9–10.3)
Chloride: 97 mmol/L — ABNORMAL LOW (ref 98–111)
Creatinine, Ser: 1.03 mg/dL (ref 0.61–1.24)
GFR, Estimated: >60 mL/min (ref >60)
Glucose, Bld: 381 mg/dL — ABNORMAL HIGH (ref 70–99)
Potassium: 4.2 mmol/L (ref 3.5–5.1)
Sodium: 132 mmol/L — ABNORMAL LOW (ref 135–145)
Total Bilirubin: 0.3 mg/dL (ref 0.3–1.2)
Total Protein: 6.8 g/dL (ref 6.5–8.1)

## 2021-06-10 MED ORDER — DEXAMETHASONE SODIUM PHOSPHATE 10 MG/ML IJ SOLN
4.0000 mg | Freq: Once | INTRAMUSCULAR | Status: AC
  Administered 2021-06-10: 4 mg via INTRAVENOUS
  Filled 2021-06-10: qty 1

## 2021-06-10 MED ORDER — TIXAGEVIMAB (PART OF EVUSHELD) INJECTION
300.0000 mg | Freq: Once | INTRAMUSCULAR | Status: AC
  Administered 2021-06-10: 300 mg via INTRAMUSCULAR
  Filled 2021-06-10: qty 3

## 2021-06-10 MED ORDER — SODIUM CHLORIDE 0.9 % IV SOLN: 4.0000 mg | Freq: Once | INTRAVENOUS | Status: DC

## 2021-06-10 MED ORDER — SODIUM CHLORIDE 0.9 % IV SOLN
150.0000 mg | Freq: Once | INTRAVENOUS | Status: AC
  Administered 2021-06-10: 150 mg via INTRAVENOUS
  Filled 2021-06-10: qty 150

## 2021-06-10 MED ORDER — DIPHENHYDRAMINE HCL 50 MG/ML IJ SOLN
50.0000 mg | Freq: Once | INTRAMUSCULAR | Status: AC
  Administered 2021-06-10: 50 mg via INTRAVENOUS
  Filled 2021-06-10: qty 1

## 2021-06-10 MED ORDER — ACETAMINOPHEN 325 MG PO TABS
650.0000 mg | ORAL_TABLET | Freq: Once | ORAL | Status: AC
  Administered 2021-06-10: 650 mg via ORAL
  Filled 2021-06-10: qty 2

## 2021-06-10 MED ORDER — CILGAVIMAB (PART OF EVUSHELD) INJECTION
300.0000 mg | Freq: Once | INTRAMUSCULAR | Status: AC
  Administered 2021-06-10: 300 mg via INTRAMUSCULAR
  Filled 2021-06-10: qty 3

## 2021-06-10 MED ORDER — VINBLASTINE SULFATE CHEMO INJECTION 1 MG/ML
6.0000 mg/m2 | Freq: Once | INTRAVENOUS | Status: AC
  Administered 2021-06-10: 14.2 mg via INTRAVENOUS
  Filled 2021-06-10: qty 14.2

## 2021-06-10 MED ORDER — HEPARIN SOD (PORK) LOCK FLUSH 100 UNIT/ML IV SOLN
500.0000 [IU] | Freq: Once | INTRAVENOUS | Status: AC
  Administered 2021-06-10: 500 [IU] via INTRAVENOUS
  Filled 2021-06-10: qty 5

## 2021-06-10 MED ORDER — SODIUM CHLORIDE 0.9 % IV SOLN
120.0000 mg | Freq: Once | INTRAVENOUS | Status: AC
  Administered 2021-06-10: 120 mg via INTRAVENOUS
  Filled 2021-06-10: qty 24

## 2021-06-10 MED ORDER — PALONOSETRON HCL INJECTION 0.25 MG/5ML
0.2500 mg | Freq: Once | INTRAVENOUS | Status: AC
  Administered 2021-06-10: 0.25 mg via INTRAVENOUS
  Filled 2021-06-10: qty 5

## 2021-06-10 MED ORDER — DOXORUBICIN HCL CHEMO IV INJECTION 2 MG/ML
25.0000 mg/m2 | Freq: Once | INTRAVENOUS | Status: AC
  Administered 2021-06-10: 60 mg via INTRAVENOUS
  Filled 2021-06-10: qty 25

## 2021-06-10 MED ORDER — SODIUM CHLORIDE 0.9 % IV SOLN
375.0000 mg/m2 | Freq: Once | INTRAVENOUS | Status: AC
  Administered 2021-06-10: 890 mg via INTRAVENOUS
  Filled 2021-06-10: qty 89

## 2021-06-10 MED ORDER — SODIUM CHLORIDE 0.9 % IV SOLN
Freq: Once | INTRAVENOUS | Status: AC
  Filled 2021-06-10: qty 250

## 2021-06-10 NOTE — Progress Notes (Signed)
1158 when checking on pt- he stated he was feeling hot/cold.  No flushing noted, BP 141/67 Hr 69 temp 96.2.  Pt state this is normal for him r/t change in BP and diabetic meds.  Discussed how glucose is higher and receiving steroids during treatment can affect it.  Will discuss with Primary MD.

## 2021-06-10 NOTE — Progress Notes (Signed)
Pt states that companize helped his nausea however made hs stomach upset. Also, sleeping less throughout the night than usual with no energy.

## 2021-06-10 NOTE — Patient Instructions (Addendum)
[facility]  Discharge Instructions: Thank you for choosing [facility] to provide your oncology and hematology care.  If you have a lab appointment with the [facility], please go directly to the [facility] and check in at the registration area.  Wear comfortable clothing and clothing appropriate for easy access to any Portacath or PICC line.   We strive to give you quality time with your provider. You may need to reschedule your appointment if you arrive late (15 or more minutes).  Arriving late affects you and other patients whose appointments are after yours.  Also, if you miss three or more appointments without notifying the office, you may be dismissed from the clinic at the provider's discretion.      For prescription refill requests, have your pharmacy contact our office and allow 72 hours for refills to be completed.    Today you received the following chemotherapy and/or immunotherapy agents adriam      To help prevent nausea and vomiting after your treatment, we encourage you to take your nausea medication as directed.  BELOW ARE SYMPTOMS THAT SHOULD BE REPORTED IMMEDIATELY: *FEVER GREATER THAN 100.4 F (38 C) OR HIGHER *CHILLS OR SWEATING *NAUSEA AND VOMITING THAT IS NOT CONTROLLED WITH YOUR NAUSEA MEDICATION *UNUSUAL SHORTNESS OF BREATH *UNUSUAL BRUISING OR BLEEDING *URINARY PROBLEMS (pain or burning when urinating, or frequent urination) *BOWEL PROBLEMS (unusual diarrhea, constipation, pain near the anus) TENDERNESS IN MOUTH AND THROAT WITH OR WITHOUT PRESENCE OF ULCERS (sore throat, sores in mouth, or a toothache) UNUSUAL RASH, SWELLING OR PAIN  UNUSUAL VAGINAL DISCHARGE OR ITCHING   Items with * indicate a potential emergency and should be followed up as soon as possible or go to the Emergency Department if any problems should occur.  Please show the CHEMOTHERAPY ALERT CARD or IMMUNOTHERAPY ALERT CARD at check-in to  the Emergency Department and triage nurse.  Should you have questions after your visit or need to cancel or reschedule your appointment, please contact [facility]  [phone] and follow the prompts.  Office hours are 8:00 a.m. to 4:30 p.m. Monday - Friday. Please note that voicemails left after 4:00 p.m. may not be returned until the following business day.  We are closed weekends and major holidays. You have access to a nurse at all times for urgent questions. Please call the main number to the clinic [phone] and follow the prompts.  For any non-urgent questions, you may also contact your provider using MyChart. We now offer e-Visits for anyone 21 and older to request care online for non-urgent symptoms. For details visit [email or web address].   Also download the MyChart app! Go to the app store, search "MyChart", open the app, select [facility], and log in with your MyChart username and password.  Due to Covid, a mask is required upon entering the hospital/clinic. If you do not have a mask, one will be given to you upon arrival. For doctor visits, patients may have 1 support person aged 24 or older with them. For treatment visits, patients cannot have anyone with them due to current Covid guidelines and our immunocompromised population.   Doxorubicin injection What is this medication? DOXORUBICIN (dox oh ROO bi sin) is a chemotherapy drug. It is used to treat many kinds of cancer like leukemia, lymphoma, neuroblastoma, sarcoma, and Wilms' tumor. It is also used to treat bladder cancer, breast cancer, lung cancer, ovarian cancer, stomach cancer, and thyroid cancer. This medicine may  be used for other purposes; ask your health care provider or pharmacist if you have questions. COMMON BRAND NAME(S): Adriamycin, Adriamycin PFS, Adriamycin RDF, Rubex What should I tell my care team before I take this medication? They need to know if you have any of these  conditions: heart disease history of low blood counts caused by a medicine liver disease recent or ongoing radiation therapy an unusual or allergic reaction to doxorubicin, other chemotherapy agents, other medicines, foods, dyes, or preservatives pregnant or trying to get pregnant breast-feeding How should I use this medication? This drug is given as an infusion into a vein. It is administered in a hospital or clinic by a specially trained health care professional. If you have pain, swelling, burning or any unusual feeling around the site of your injection, tell your health care professional right away. Talk to your pediatrician regarding the use of this medicine in children. Special care may be needed. Overdosage: If you think you have taken too much of this medicine contact a poison control center or emergency room at once. NOTE: This medicine is only for you. Do not share this medicine with others. What if I miss a dose? It is important not to miss your dose. Call your doctor or health care professional if you are unable to keep an appointment. What may interact with this medication? This medicine may interact with the following medications: 6-mercaptopurine paclitaxel phenytoin St. John's Wort trastuzumab verapamil This list may not describe all possible interactions. Give your health care provider a list of all the medicines, herbs, non-prescription drugs, or dietary supplements you use. Also tell them if you smoke, drink alcohol, or use illegal drugs. Some items may interact with your medicine. What should I watch for while using this medication? This drug may make you feel generally unwell. This is not uncommon, as chemotherapy can affect healthy cells as well as cancer cells. Report any side effects. Continue your course of treatment even though you feel ill unless your doctor tells you to stop. There is a maximum amount of this medicine you should receive throughout your life. The  amount depends on the medical condition being treated and your overall health. Your doctor will watch how much of this medicine you receive in your lifetime. Tell your doctor if you have taken this medicine before. You may need blood work done while you are taking this medicine. Your urine may turn red for a few days after your dose. This is not blood. If your urine is dark or brown, call your doctor. In some cases, you may be given additional medicines to help with side effects. Follow all directions for their use. Call your doctor or health care professional for advice if you get a fever, chills or sore throat, or other symptoms of a cold or flu. Do not treat yourself. This drug decreases your body's ability to fight infections. Try to avoid being around people who are sick. This medicine may increase your risk to bruise or bleed. Call your doctor or health care professional if you notice any unusual bleeding. Talk to your doctor about your risk of cancer. You may be more at risk for certain types of cancers if you take this medicine. Do not become pregnant while taking this medicine or for 6 months after stopping it. Women should inform their doctor if they wish to become pregnant or think they might be pregnant. Men should not father a child while taking this medicine and for 6 months after  stopping it. There is a potential for serious side effects to an unborn child. Talk to your health care professional or pharmacist for more information. Do not breast-feed an infant while taking this medicine. This medicine has caused ovarian failure in some women and reduced sperm counts in some men This medicine may interfere with the ability to have a child. Talk with your doctor or health care professional if you are concerned about your fertility. This medicine may cause a decrease in Co-Enzyme Q-10. You should make sure that you get enough Co-Enzyme Q-10 while you are taking this medicine. Discuss the foods you  eat and the vitamins you take with your health care professional. What side effects may I notice from receiving this medication? Side effects that you should report to your doctor or health care professional as soon as possible: allergic reactions like skin rash, itching or hives, swelling of the face, lips, or tongue breathing problems chest pain fast or irregular heartbeat low blood counts - this medicine may decrease the number of white blood cells, red blood cells and platelets. You may be at increased risk for infections and bleeding. pain, redness, or irritation at site where injected signs of infection - fever or chills, cough, sore throat, pain or difficulty passing urine signs of decreased platelets or bleeding - bruising, pinpoint red spots on the skin, black, tarry stools, blood in the urine swelling of the ankles, feet, hands tiredness weakness Side effects that usually do not require medical attention (report to your doctor or health care professional if they continue or are bothersome): diarrhea hair loss mouth sores nail discoloration or damage nausea red colored urine vomiting This list may not describe all possible side effects. Call your doctor for medical advice about side effects. You may report side effects to FDA at 1-800-FDA-1088. Where should I keep my medication? This drug is given in a hospital or clinic and will not be stored at home. NOTE: This sheet is a summary. It may not cover all possible information. If you have questions about this medicine, talk to your doctor, pharmacist, or health care provider.  2022 Elsevier/Gold Standard (2017-03-03 00:00:00)  Vinblastine injection What is this medication? VINBLASTINE (vin BLAS teen) is a chemotherapy drug. It slows the growth of cancer cells. This medicine is used to treat many types of cancer like breast cancer, testicular cancer, Hodgkin's disease, non-Hodgkin's lymphoma, and sarcoma. This medicine may be  used for other purposes; ask your health care provider or pharmacist if you have questions. COMMON BRAND NAME(S): Velban What should I tell my care team before I take this medication? They need to know if you have any of these conditions: blood disorders dental disease gout infection (especially a virus infection such as chickenpox, cold sores, or herpes) liver disease lung disease nervous system disease recent or ongoing radiation therapy an unusual or allergic reaction to vinblastine, other chemotherapy agents, other medicines, foods, dyes, or preservatives pregnant or trying to get pregnant breast-feeding How should I use this medication? This drug is given as an infusion into a vein. It is administered in a hospital or clinic by a specially trained health care professional. If you have pain, swelling, burning or any unusual feeling around the site of your injection, tell your health care professional right away. Talk to your pediatrician regarding the use of this medicine in children. While this drug may be prescribed for selected conditions, precautions do apply. Overdosage: If you think you have taken too much of this  medicine contact a poison control center or emergency room at once. NOTE: This medicine is only for you. Do not share this medicine with others. What if I miss a dose? It is important not to miss your dose. Call your doctor or health care professional if you are unable to keep an appointment. What may interact with this medication? erythromycin certain medicines for fungal infections like itraconazole, ketoconazole, posaconazole, voriconazole certain medicines for seizures like phenytoin This list may not describe all possible interactions. Give your health care provider a list of all the medicines, herbs, non-prescription drugs, or dietary supplements you use. Also tell them if you smoke, drink alcohol, or use illegal drugs. Some items may interact with your  medicine. What should I watch for while using this medication? Your condition will be monitored carefully while you are receiving this medicine. You will need important blood work done while you are taking this medicine. This drug may make you feel generally unwell. This is not uncommon, as chemotherapy can affect healthy cells as well as cancer cells. Report any side effects. Continue your course of treatment even though you feel ill unless your doctor tells you to stop. In some cases, you may be given additional medicines to help with side effects. Follow all directions for their use. Call your doctor or health care professional for advice if you get a fever, chills or sore throat, or other symptoms of a cold or flu. Do not treat yourself. This drug decreases your body's ability to fight infections. Try to avoid being around people who are sick. This medicine may increase your risk to bruise or bleed. Call your doctor or health care professional if you notice any unusual bleeding. Be careful brushing and flossing your teeth or using a toothpick because you may get an infection or bleed more easily. If you have any dental work done, tell your dentist you are receiving this medicine. Avoid taking products that contain aspirin, acetaminophen, ibuprofen, naproxen, or ketoprofen unless instructed by your doctor. These medicines may hide a fever. Do not become pregnant while taking this medicine. Women should inform their doctor if they wish to become pregnant or think they might be pregnant. There is a potential for serious side effects to an unborn child. Talk to your health care professional or pharmacist for more information. Do not breast-feed an infant while taking this medicine. Men may have a lower sperm count while taking this medicine. Talk to your doctor if you plan to father a child. What side effects may I notice from receiving this medication? Side effects that you should report to your doctor  or health care professional as soon as possible: allergic reactions like skin rash, itching or hives, swelling of the face, lips, or tongue low blood counts - This drug may decrease the number of white blood cells, red blood cells and platelets. You may be at increased risk for infections and bleeding. signs of infection - fever or chills, cough, sore throat, pain or difficulty passing urine signs of decreased platelets or bleeding - bruising, pinpoint red spots on the skin, black, tarry stools, nosebleeds signs of decreased red blood cells - unusually weak or tired, fainting spells, lightheadedness breathing problems changes in hearing change in the amount of urine chest pain high blood pressure mouth sores nausea and vomiting pain, swelling, redness or irritation at the injection site pain, tingling, numbness in the hands or feet problems with balance, dizziness seizures Side effects that usually do not require medical  attention (report to your doctor or health care professional if they continue or are bothersome): constipation hair loss jaw pain loss of appetite sensitivity to light stomach pain tumor pain This list may not describe all possible side effects. Call your doctor for medical advice about side effects. You may report side effects to FDA at 1-800-FDA-1088. Where should I keep my medication? This drug is given in a hospital or clinic and will not be stored at home. NOTE: This sheet is a summary. It may not cover all possible information. If you have questions about this medicine, talk to your doctor, pharmacist, or health care provider.  2022 Elsevier/Gold Standard (2021-03-17 00:00:00)  Dacarbazine, DTIC injection What is this medication? DACARBAZINE (da KAR ba zeen) is a chemotherapy drug. This medicine is used to treat skin cancer. It is also used with other medicines to treat Hodgkin's disease. This medicine may be used for other purposes; ask your health care  provider or pharmacist if you have questions. COMMON BRAND NAME(S): DTIC-Dome What should I tell my care team before I take this medication? They need to know if you have any of these conditions: infection (especially virus infection such as chickenpox, cold sores, or herpes) kidney disease liver disease low blood counts like low platelets, red blood cells, white blood cells recent radiation therapy an unusual or allergic reaction to dacarbazine, other chemotherapy agents, other medicines, foods, dyes, or preservatives pregnant or trying to get pregnant breast-feeding How should I use this medication? This drug is given as an injection or infusion into a vein. It is administered in a hospital or clinic by a specially trained health care professional. Talk to your pediatrician regarding the use of this medicine in children. While this drug may be prescribed for selected conditions, precautions do apply. Overdosage: If you think you have taken too much of this medicine contact a poison control center or emergency room at once. NOTE: This medicine is only for you. Do not share this medicine with others. What if I miss a dose? It is important not to miss your dose. Call your doctor or health care professional if you are unable to keep an appointment. What may interact with this medication? medicines to increase blood counts like filgrastim, pegfilgrastim, sargramostim vaccines This list may not describe all possible interactions. Give your health care provider a list of all the medicines, herbs, non-prescription drugs, or dietary supplements you use. Also tell them if you smoke, drink alcohol, or use illegal drugs. Some items may interact with your medicine. What should I watch for while using this medication? Your condition will be monitored carefully while you are receiving this medicine. You will need important blood work done while you are taking this medicine. This drug may make you feel  generally unwell. This is not uncommon, as chemotherapy can affect healthy cells as well as cancer cells. Report any side effects. Continue your course of treatment even though you feel ill unless your doctor tells you to stop. Call your doctor or health care professional for advice if you get a fever, chills or sore throat, or other symptoms of a cold or flu. Do not treat yourself. This drug decreases your body's ability to fight infections. Try to avoid being around people who are sick. This medicine may increase your risk to bruise or bleed. Call your doctor or health care professional if you notice any unusual bleeding. Talk to your doctor about your risk of cancer. You may be more at risk for  certain types of cancers if you take this medicine. Do not become pregnant while taking this medicine. Women should inform their doctor if they wish to become pregnant or think they might be pregnant. There is a potential for serious side effects to an unborn child. Talk to your health care professional or pharmacist for more information. Do not breast-feed an infant while taking this medicine. What side effects may I notice from receiving this medication? Side effects that you should report to your doctor or health care professional as soon as possible: allergic reactions like skin rash, itching or hives, swelling of the face, lips, or tongue low blood counts - this medicine may decrease the number of white blood cells, red blood cells and platelets. You may be at increased risk for infections and bleeding. signs of infection - fever or chills, cough, sore throat, pain or difficulty passing urine signs of decreased platelets or bleeding - bruising, pinpoint red spots on the skin, black, tarry stools, blood in the urine signs of decreased red blood cells - unusually weak or tired, fainting spells, lightheadedness breathing problems muscle pains pain at site where injected trouble passing urine or change in  the amount of urine vomiting yellowing of the eyes or skin Side effects that usually do not require medical attention (report to your doctor or health care professional if they continue or are bothersome): diarrhea hair loss loss of appetite nausea skin more sensitive to sun or ultraviolet light stomach upset This list may not describe all possible side effects. Call your doctor for medical advice about side effects. You may report side effects to FDA at 1-800-FDA-1088. Where should I keep my medication? This drug is given in a hospital or clinic and will not be stored at home. NOTE: This sheet is a summary. It may not cover all possible information. If you have questions about this medicine, talk to your doctor, pharmacist, or health care provider.  2022 Elsevier/Gold Standard (2016-01-14 00:00:00)  Brentuximab vedotin solution for injection What is this medication? BRENTUXIMAB VEDOTIN (bren TUX see mab ve DOE tin) is a monoclonal antibody and a chemotherapy drug. It is used for treating Hodgkin lymphoma and certain non-Hodgkin lymphomas, such as anaplastic large-cell lymphoma, mycosis fungoides, and peripheral T-cell lymphoma. This medicine may be used for other purposes; ask your health care provider or pharmacist if you have questions. COMMON BRAND NAME(S): ADCETRIS What should I tell my care team before I take this medication? They need to know if you have any of these conditions: immune system problems infection (especially a virus infection such as chickenpox, cold sores, or herpes) kidney disease liver disease low blood counts, like low white cell, platelet, or red cell counts tingling of the fingers or toes, or other nerve disorder an unusual or allergic reaction to brentuximab vedotin, other medicines, foods, dyes, or preservatives pregnant or trying to get pregnant breast-feeding How should I use this medication? This medicine is for infusion into a vein. It is given by a  health care professional in a hospital or clinic setting. Talk to your pediatrician regarding the use of this medicine in children. Special care may be needed. Overdosage: If you think you have taken too much of this medicine contact a poison control center or emergency room at once. NOTE: This medicine is only for you. Do not share this medicine with others. What if I miss a dose? It is important not to miss your dose. Call your doctor or health care professional if you  are unable to keep an appointment. What may interact with this medication? Do not take this medicine with any of the following medications: bleomycin This medicine may also interact with the following medications: ketoconazole rifampin St. John's wort; Hypericum perforatum This list may not describe all possible interactions. Give your health care provider a list of all the medicines, herbs, non-prescription drugs, or dietary supplements you use. Also tell them if you smoke, drink alcohol, or use illegal drugs. Some items may interact with your medicine. What should I watch for while using this medication? Visit your doctor for checks on your progress. This drug may make you feel generally unwell. Report any side effects. Continue your course of treatment even though you feel ill unless your doctor tells you to stop. Call your doctor or health care professional for advice if you get a fever, chills or sore throat, or other symptoms of a cold or flu. Do not treat yourself. This drug decreases your body's ability to fight infections. Try to avoid being around people who are sick. This medicine may increase your risk to bruise or bleed. Call your doctor or health care professional if you notice any unusual bleeding. In some patients, this medicine may cause a serious brain infection that may cause death. If you have any problems seeing, thinking, speaking, walking, or standing, tell your doctor right away. If you cannot reach your  doctor, urgently seek other source of medical care. Do not become pregnant while taking this medicine or for 6 months after stopping it. Women should inform their doctor if they wish to become pregnant or think they might be pregnant. Men should not father a child while taking this medicine and for 6 months after stopping it. There is a potential for serious side effects to an unborn child. Talk to your health care professional or pharmacist for more information. Do not breast-feed an infant while taking this medicine. This may interfere with the ability to father a child. You should talk to your doctor or health care professional if you are concerned about your fertility. What side effects may I notice from receiving this medication? Side effects that you should report to your doctor or health care professional as soon as possible: allergic reactions like skin rash, itching or hives, swelling of the face, lips, or tongue changes in emotions or moods diarrhea low blood counts - this medicine may decrease the number of white blood cells, red blood cells and platelets. You may be at increased risk for infections and bleeding. pain, tingling, numbness in the hands or feet redness, blistering, peeling or loosening of the skin, including inside the mouth shortness of breath signs of infection - fever or chills, cough, sore throat, pain or difficulty passing urine signs of decreased platelets or bleeding - bruising, pinpoint red spots on the skin, black, tarry stools, blood in the urine signs of decreased red blood cells - unusually weak or tired, fainting spells, lightheadedness signs of liver injury like dark yellow or brown urine; general ill feeling or flu-like symptoms; light-colored stools; loss of appetite; nausea; right upper belly pain; yellowing of the eyes or skin stomach pain sudden numbness or weakness of the face, arm or leg vomiting Side effects that usually do not require medical  attention (report to your doctor or health care professional if they continue or are bothersome): constipation dizziness headache muscle pain tiredness This list may not describe all possible side effects. Call your doctor for medical advice about side effects. You  may report side effects to FDA at 1-800-FDA-1088. Where should I keep my medication? This drug is given in a hospital or clinic and will not be stored at home. NOTE: This sheet is a summary. It may not cover all possible information. If you have questions about this medicine, talk to your doctor, pharmacist, or health care provider.  2022 [employer] (2021-03-17 00:00:00)

## 2021-06-12 ENCOUNTER — Other Ambulatory Visit: Payer: Self-pay

## 2021-06-12 ENCOUNTER — Inpatient Hospital Stay: Payer: 59 | Attending: Oncology

## 2021-06-12 DIAGNOSIS — C8178 Other classical Hodgkin lymphoma, lymph nodes of multiple sites: Secondary | ICD-10-CM

## 2021-06-12 DIAGNOSIS — T380X5A Adverse effect of glucocorticoids and synthetic analogues, initial encounter: Secondary | ICD-10-CM | POA: Diagnosis not present

## 2021-06-12 DIAGNOSIS — C8190 Hodgkin lymphoma, unspecified, unspecified site: Secondary | ICD-10-CM | POA: Diagnosis not present

## 2021-06-12 DIAGNOSIS — Z5111 Encounter for antineoplastic chemotherapy: Secondary | ICD-10-CM | POA: Diagnosis present

## 2021-06-12 DIAGNOSIS — E1165 Type 2 diabetes mellitus with hyperglycemia: Secondary | ICD-10-CM | POA: Diagnosis not present

## 2021-06-12 DIAGNOSIS — D649 Anemia, unspecified: Secondary | ICD-10-CM | POA: Insufficient documentation

## 2021-06-12 DIAGNOSIS — Z79899 Other long term (current) drug therapy: Secondary | ICD-10-CM | POA: Insufficient documentation

## 2021-06-12 DIAGNOSIS — Z7984 Long term (current) use of oral hypoglycemic drugs: Secondary | ICD-10-CM | POA: Diagnosis not present

## 2021-06-12 MED ORDER — PEGFILGRASTIM-BMEZ 6 MG/0.6ML ~~LOC~~ SOSY
6.0000 mg | PREFILLED_SYRINGE | Freq: Once | SUBCUTANEOUS | Status: AC
  Administered 2021-06-12: 6 mg via SUBCUTANEOUS
  Filled 2021-06-12: qty 0.6

## 2021-06-16 ENCOUNTER — Other Ambulatory Visit: Payer: Self-pay | Admitting: *Deleted

## 2021-06-16 ENCOUNTER — Telehealth: Payer: Self-pay | Admitting: *Deleted

## 2021-06-16 MED ORDER — PANTOPRAZOLE SODIUM 20 MG PO TBEC: 20.0000 mg | DELAYED_RELEASE_TABLET | Freq: Every day | ORAL | 1 refills | Status: DC

## 2021-06-16 MED ORDER — OXYCODONE HCL 5 MG PO TABS: 5.0000 mg | ORAL_TABLET | ORAL | 0 refills | Status: DC | PRN

## 2021-06-16 NOTE — Telephone Encounter (Signed)
Pt called to let us know that he has fatigue. Yesterday he went to work and it took a toll on him and when he got home he went to bed. He has aches and pain from  wbc shot. He has been taking claritin and tylenol and the pain not better. He is having bad reflux and feels he needs help with pain and reflux. I called him and let him know that dr Janese Banks say he can have protonix 20 mg daily and can use oxycodone every 8 hours as needed. He is agreeable to this and will pick up meds

## 2021-06-16 NOTE — Progress Notes (Signed)
pant

## 2021-06-16 NOTE — Telephone Encounter (Signed)
I realized that the frequency was every 8 hours and I put in 4 hours and I called the pharmacy and spoke to pharmacist and she changed the the frequency to 8 hours

## 2021-06-23 ENCOUNTER — Other Ambulatory Visit: Payer: Self-pay | Admitting: *Deleted

## 2021-06-23 ENCOUNTER — Telehealth: Payer: Self-pay | Admitting: *Deleted

## 2021-06-23 MED ORDER — SULFAMETHOXAZOLE-TRIMETHOPRIM 800-160 MG PO TABS: 1.0000 | ORAL_TABLET | Freq: Two times a day (BID) | ORAL | 0 refills | Status: DC

## 2021-06-23 NOTE — Telephone Encounter (Signed)
Pt called and he has spider bite on hand and it is red and swollen and pus coming out of it. Lauren says to give him bactrim ds and move his chemo from tom. To next week. I called and told him all of this. He knows to get atb and then next treatment is 12/21 at 10:30 for labs . He is agreeable

## 2021-06-24 ENCOUNTER — Inpatient Hospital Stay: Payer: 59 | Admitting: Oncology

## 2021-06-24 ENCOUNTER — Inpatient Hospital Stay: Payer: 59

## 2021-06-24 ENCOUNTER — Ambulatory Visit: Payer: 59 | Admitting: Nurse Practitioner

## 2021-06-24 ENCOUNTER — Ambulatory Visit: Payer: 59

## 2021-06-24 ENCOUNTER — Other Ambulatory Visit: Payer: 59

## 2021-06-26 ENCOUNTER — Inpatient Hospital Stay: Payer: 59

## 2021-07-01 ENCOUNTER — Inpatient Hospital Stay (HOSPITAL_BASED_OUTPATIENT_CLINIC_OR_DEPARTMENT_OTHER): Payer: 59 | Admitting: Oncology

## 2021-07-01 ENCOUNTER — Other Ambulatory Visit: Payer: Self-pay

## 2021-07-01 ENCOUNTER — Encounter: Payer: Self-pay | Admitting: Oncology

## 2021-07-01 ENCOUNTER — Inpatient Hospital Stay: Payer: 59

## 2021-07-01 VITALS — BP 139/79 | HR 73 | Temp 96.7°F | Ht 71.0 in | Wt 239.4 lb

## 2021-07-01 DIAGNOSIS — C8178 Other classical Hodgkin lymphoma, lymph nodes of multiple sites: Secondary | ICD-10-CM

## 2021-07-01 DIAGNOSIS — C8190 Hodgkin lymphoma, unspecified, unspecified site: Secondary | ICD-10-CM | POA: Diagnosis not present

## 2021-07-01 LAB — COMPREHENSIVE METABOLIC PANEL
ALT: 26 U/L (ref 0–44)
AST: 19 U/L (ref 15–41)
Albumin: 4 g/dL (ref 3.5–5.0)
Alkaline Phosphatase: 69 U/L (ref 38–126)
Anion gap: 8 (ref 5–15)
BUN: 13 mg/dL (ref 6–20)
CO2: 25 mmol/L (ref 22–32)
Calcium: 9.1 mg/dL (ref 8.9–10.3)
Chloride: 103 mmol/L (ref 98–111)
Creatinine, Ser: 0.84 mg/dL (ref 0.61–1.24)
GFR, Estimated: >60 mL/min (ref >60)
Glucose, Bld: 175 mg/dL — ABNORMAL HIGH (ref 70–99)
Potassium: 3.9 mmol/L (ref 3.5–5.1)
Sodium: 136 mmol/L (ref 135–145)
Total Bilirubin: 0.6 mg/dL (ref 0.3–1.2)
Total Protein: 6.7 g/dL (ref 6.5–8.1)

## 2021-07-01 LAB — CBC WITH DIFFERENTIAL/PLATELET
Abs Immature Granulocytes: 0.04 10*3/uL (ref 0.00–0.07)
Basophils Absolute: 0.1 10*3/uL (ref 0.0–0.1)
Basophils Relative: 1 %
Eosinophils Absolute: 0.1 10*3/uL (ref 0.0–0.5)
Eosinophils Relative: 1 %
HCT: 32.5 % — ABNORMAL LOW (ref 39.0–52.0)
Hemoglobin: 11 g/dL — ABNORMAL LOW (ref 13.0–17.0)
Immature Granulocytes: 1 %
Lymphocytes Relative: 17 %
Lymphs Abs: 1.3 10*3/uL (ref 0.7–4.0)
MCH: 26.8 pg (ref 26.0–34.0)
MCHC: 33.8 g/dL (ref 30.0–36.0)
MCV: 79.1 fL — ABNORMAL LOW (ref 80.0–100.0)
Monocytes Absolute: 0.5 10*3/uL (ref 0.1–1.0)
Monocytes Relative: 7 %
Neutro Abs: 5.7 10*3/uL (ref 1.7–7.7)
Neutrophils Relative %: 73 %
Platelets: 387 10*3/uL (ref 150–400)
RBC: 4.11 MIL/uL — ABNORMAL LOW (ref 4.22–5.81)
RDW: 16.1 % — ABNORMAL HIGH (ref 11.5–15.5)
WBC: 7.7 10*3/uL (ref 4.0–10.5)
nRBC: 0 % (ref 0.0–0.2)

## 2021-07-01 MED ORDER — PALONOSETRON HCL INJECTION 0.25 MG/5ML
0.2500 mg | Freq: Once | INTRAVENOUS | Status: AC
  Administered 2021-07-01: 12:00:00 0.25 mg via INTRAVENOUS
  Filled 2021-07-01: qty 5

## 2021-07-01 MED ORDER — SODIUM CHLORIDE 0.9 % IV SOLN
120.0000 mg | Freq: Once | INTRAVENOUS | Status: AC
  Administered 2021-07-01: 14:00:00 120 mg via INTRAVENOUS
  Filled 2021-07-01: qty 24

## 2021-07-01 MED ORDER — SODIUM CHLORIDE 0.9 % IV SOLN
375.0000 mg/m2 | Freq: Once | INTRAVENOUS | Status: AC
  Administered 2021-07-01: 13:00:00 890 mg via INTRAVENOUS
  Filled 2021-07-01: qty 89

## 2021-07-01 MED ORDER — DIPHENHYDRAMINE HCL 50 MG/ML IJ SOLN
50.0000 mg | Freq: Once | INTRAMUSCULAR | Status: AC
  Administered 2021-07-01: 12:00:00 50 mg via INTRAVENOUS
  Filled 2021-07-01: qty 1

## 2021-07-01 MED ORDER — SODIUM CHLORIDE 0.9% FLUSH
10.0000 mL | INTRAVENOUS | Status: DC | PRN
  Filled 2021-07-01: qty 10

## 2021-07-01 MED ORDER — DOXORUBICIN HCL CHEMO IV INJECTION 2 MG/ML
25.0000 mg/m2 | Freq: Once | INTRAVENOUS | Status: AC
  Administered 2021-07-01: 12:00:00 60 mg via INTRAVENOUS
  Filled 2021-07-01: qty 25

## 2021-07-01 MED ORDER — ACETAMINOPHEN 325 MG PO TABS
650.0000 mg | ORAL_TABLET | Freq: Once | ORAL | Status: AC
  Administered 2021-07-01: 12:00:00 650 mg via ORAL
  Filled 2021-07-01: qty 2

## 2021-07-01 MED ORDER — SODIUM CHLORIDE 0.9 % IV SOLN
Freq: Once | INTRAVENOUS | Status: AC
  Filled 2021-07-01: qty 250

## 2021-07-01 MED ORDER — VINBLASTINE SULFATE CHEMO INJECTION 1 MG/ML
6.0000 mg/m2 | Freq: Once | INTRAVENOUS | Status: AC
  Administered 2021-07-01: 13:00:00 14.2 mg via INTRAVENOUS
  Filled 2021-07-01: qty 14.2

## 2021-07-01 MED ORDER — HEPARIN SOD (PORK) LOCK FLUSH 100 UNIT/ML IV SOLN
500.0000 [IU] | Freq: Once | INTRAVENOUS | Status: AC | PRN
  Filled 2021-07-01: qty 5

## 2021-07-01 MED ORDER — SODIUM CHLORIDE 0.9 % IV SOLN
150.0000 mg | Freq: Once | INTRAVENOUS | Status: AC
  Administered 2021-07-01: 12:00:00 150 mg via INTRAVENOUS
  Filled 2021-07-01: qty 150

## 2021-07-01 MED ORDER — SODIUM CHLORIDE 0.9 % IV SOLN
10.0000 mg | Freq: Once | INTRAVENOUS | Status: AC
  Administered 2021-07-01: 12:00:00 10 mg via INTRAVENOUS
  Filled 2021-07-01: qty 10

## 2021-07-01 MED ORDER — PROCHLORPERAZINE MALEATE 10 MG PO TABS: 10.0000 mg | ORAL_TABLET | Freq: Once | ORAL | 0 refills | Status: DC

## 2021-07-01 MED ORDER — PROCHLORPERAZINE EDISYLATE 10 MG/2ML IJ SOLN
10.0000 mg | Freq: Once | INTRAMUSCULAR | Status: AC
  Administered 2021-07-01: 15:00:00 10 mg via INTRAVENOUS

## 2021-07-01 MED ORDER — HEPARIN SOD (PORK) LOCK FLUSH 100 UNIT/ML IV SOLN
INTRAVENOUS | Status: AC
  Administered 2021-07-01: 15:00:00 500 [IU]
  Filled 2021-07-01: qty 5

## 2021-07-01 NOTE — Progress Notes (Signed)
Patient developed nausea after lunch, midway through brentuximab infusion. Compazine 10mg  IV given per orders with modest relief of nausea. He has antiemetics available for home use (ondansetron to begin 3rd day after chemo, as well as prn lorazepam).

## 2021-07-01 NOTE — Patient Instructions (Addendum)
[facility] [place]  Discharge Instructions: Thank you for choosing [facility] to provide your oncology and hematology care.  If you have a lab appointment with the [facility], please go directly to the [facility] and check in at the registration area.  Wear comfortable clothing and clothing appropriate for easy access to any Portacath or PICC line.   We strive to give you quality time with your provider. You may need to reschedule your appointment if you arrive late (15 or more minutes).  Arriving late affects you and other patients whose appointments are after yours.  Also, if you miss three or more appointments without notifying the office, you may be dismissed from the clinic at the providers discretion.      For prescription refill requests, have your pharmacy contact our office and allow 72 hours for refills to be completed.    Today you received the following chemotherapy and/or immunotherapy agents - doxorubicin, vinblastine, dacarbazine, brentuximab   To help prevent nausea and vomiting after your treatment, we encourage you to take your nausea medication as directed.  BELOW ARE SYMPTOMS THAT SHOULD BE REPORTED IMMEDIATELY: *FEVER GREATER THAN 100.4 F (38 C) OR HIGHER *CHILLS OR SWEATING *NAUSEA AND VOMITING THAT IS NOT CONTROLLED WITH YOUR NAUSEA MEDICATION *UNUSUAL SHORTNESS OF BREATH *UNUSUAL BRUISING OR BLEEDING *URINARY PROBLEMS (pain or burning when urinating, or frequent urination) *BOWEL PROBLEMS (unusual diarrhea, constipation, pain near the anus) TENDERNESS IN MOUTH AND THROAT WITH OR WITHOUT PRESENCE OF ULCERS (sore throat, sores in mouth, or a toothache) UNUSUAL RASH, SWELLING OR PAIN  UNUSUAL VAGINAL DISCHARGE OR ITCHING   Items with * indicate a potential emergency and should be followed up as soon as possible or go to the Emergency Department if any problems should occur.  Please show the CHEMOTHERAPY ALERT CARD or  IMMUNOTHERAPY ALERT CARD at check-in to the Emergency Department and triage nurse.  Should you have questions after your visit or need to cancel or reschedule your appointment, please contact [facility] [place] [facility] [place]  [phone] and follow the prompts.  Office hours are 8:00 a.m. to 4:30 p.m. Monday - Friday. Please note that voicemails left after 4:00 p.m. may not be returned until the following business day.  We are closed weekends and major holidays. You have access to a nurse at all times for urgent questions. Please call the main number to the clinic [phone] and follow the prompts.  For any non-urgent questions, you may also contact your provider using MyChart. We now offer e-Visits for anyone 13 and older to request care online for non-urgent symptoms. For details visit [email or web address].   Also download the MyChart app! Go to the app store, search "MyChart", open the app, select [facility], and log in with your MyChart username and password.  Due to Covid, a mask is required upon entering the hospital/clinic. If you do not have a mask, one will be given to you upon arrival. For doctor visits, patients may have 1 support person aged 57 or older with them. For treatment visits, patients cannot have anyone with them due to current Covid guidelines and our immunocompromised population.   Doxorubicin injection What is this medication? DOXORUBICIN (dox oh ROO bi sin) is a chemotherapy drug. It is used to treat many kinds of cancer like leukemia, lymphoma, neuroblastoma, sarcoma, and Wilms' tumor. It is also used to treat bladder cancer, breast cancer, lung cancer, ovarian cancer, stomach cancer, and thyroid cancer. This medicine  may be used for other purposes; ask your health care provider or pharmacist if you have questions. COMMON BRAND NAME(S): Adriamycin, Adriamycin PFS, Adriamycin RDF, Rubex What should I tell my care team before I take this medication? They  need to know if you have any of these conditions: heart disease history of low blood counts caused by a medicine liver disease recent or ongoing radiation therapy an unusual or allergic reaction to doxorubicin, other chemotherapy agents, other medicines, foods, dyes, or preservatives pregnant or trying to get pregnant breast-feeding How should I use this medication? This drug is given as an infusion into a vein. It is administered in a hospital or clinic by a specially trained health care professional. If you have pain, swelling, burning or any unusual feeling around the site of your injection, tell your health care professional right away. Talk to your pediatrician regarding the use of this medicine in children. Special care may be needed. Overdosage: If you think you have taken too much of this medicine contact a poison control center or emergency room at once. NOTE: This medicine is only for you. Do not share this medicine with others. What if I miss a dose? It is important not to miss your dose. Call your doctor or health care professional if you are unable to keep an appointment. What may interact with this medication? This medicine may interact with the following medications: 6-mercaptopurine paclitaxel phenytoin St. John's Wort trastuzumab verapamil This list may not describe all possible interactions. Give your health care provider a list of all the medicines, herbs, non-prescription drugs, or dietary supplements you use. Also tell them if you smoke, drink alcohol, or use illegal drugs. Some items may interact with your medicine. What should I watch for while using this medication? This drug may make you feel generally unwell. This is not uncommon, as chemotherapy can affect healthy cells as well as cancer cells. Report any side effects. Continue your course of treatment even though you feel ill unless your doctor tells you to stop. There is a maximum amount of this medicine you  should receive throughout your life. The amount depends on the medical condition being treated and your overall health. Your doctor will watch how much of this medicine you receive in your lifetime. Tell your doctor if you have taken this medicine before. You may need blood work done while you are taking this medicine. Your urine may turn red for a few days after your dose. This is not blood. If your urine is dark or brown, call your doctor. In some cases, you may be given additional medicines to help with side effects. Follow all directions for their use. Call your doctor or health care professional for advice if you get a fever, chills or sore throat, or other symptoms of a cold or flu. Do not treat yourself. This drug decreases your body's ability to fight infections. Try to avoid being around people who are sick. This medicine may increase your risk to bruise or bleed. Call your doctor or health care professional if you notice any unusual bleeding. Talk to your doctor about your risk of cancer. You may be more at risk for certain types of cancers if you take this medicine. Do not become pregnant while taking this medicine or for 6 months after stopping it. Women should inform their doctor if they wish to become pregnant or think they might be pregnant. Men should not father a child while taking this medicine and for 6 months  after stopping it. There is a potential for serious side effects to an unborn child. Talk to your health care professional or pharmacist for more information. Do not breast-feed an infant while taking this medicine. This medicine has caused ovarian failure in some women and reduced sperm counts in some men This medicine may interfere with the ability to have a child. Talk with your doctor or health care professional if you are concerned about your fertility. This medicine may cause a decrease in Co-Enzyme Q-10. You should make sure that you get enough Co-Enzyme Q-10 while you are  taking this medicine. Discuss the foods you eat and the vitamins you take with your health care professional. What side effects may I notice from receiving this medication? Side effects that you should report to your doctor or health care professional as soon as possible: allergic reactions like skin rash, itching or hives, swelling of the face, lips, or tongue breathing problems chest pain fast or irregular heartbeat low blood counts - this medicine may decrease the number of white blood cells, red blood cells and platelets. You may be at increased risk for infections and bleeding. pain, redness, or irritation at site where injected signs of infection - fever or chills, cough, sore throat, pain or difficulty passing urine signs of decreased platelets or bleeding - bruising, pinpoint red spots on the skin, black, tarry stools, blood in the urine swelling of the ankles, feet, hands tiredness weakness Side effects that usually do not require medical attention (report to your doctor or health care professional if they continue or are bothersome): diarrhea hair loss mouth sores nail discoloration or damage nausea red colored urine vomiting This list may not describe all possible side effects. Call your doctor for medical advice about side effects. You may report side effects to FDA at 1-800-FDA-1088. Where should I keep my medication? This drug is given in a hospital or clinic and will not be stored at home. NOTE: This sheet is a summary. It may not cover all possible information. If you have questions about this medicine, talk to your doctor, pharmacist, or health care provider.  2022 Elsevier/Gold Standard (2017-03-03 00:00:00)   Vinblastine injection What is this medication? VINBLASTINE (vin BLAS teen) is a chemotherapy drug. It slows the growth of cancer cells. This medicine is used to treat many types of cancer like breast cancer, testicular cancer, Hodgkin's disease, non-Hodgkin's  lymphoma, and sarcoma. This medicine may be used for other purposes; ask your health care provider or pharmacist if you have questions. COMMON BRAND NAME(S): Velban What should I tell my care team before I take this medication? They need to know if you have any of these conditions: blood disorders dental disease gout infection (especially a virus infection such as chickenpox, cold sores, or herpes) liver disease lung disease nervous system disease recent or ongoing radiation therapy an unusual or allergic reaction to vinblastine, other chemotherapy agents, other medicines, foods, dyes, or preservatives pregnant or trying to get pregnant breast-feeding How should I use this medication? This drug is given as an infusion into a vein. It is administered in a hospital or clinic by a specially trained health care professional. If you have pain, swelling, burning or any unusual feeling around the site of your injection, tell your health care professional right away. Talk to your pediatrician regarding the use of this medicine in children. While this drug may be prescribed for selected conditions, precautions do apply. Overdosage: If you think you have taken too much  of this medicine contact a poison control center or emergency room at once. NOTE: This medicine is only for you. Do not share this medicine with others. What if I miss a dose? It is important not to miss your dose. Call your doctor or health care professional if you are unable to keep an appointment. What may interact with this medication? erythromycin certain medicines for fungal infections like itraconazole, ketoconazole, posaconazole, voriconazole certain medicines for seizures like phenytoin This list may not describe all possible interactions. Give your health care provider a list of all the medicines, herbs, non-prescription drugs, or dietary supplements you use. Also tell them if you smoke, drink alcohol, or use illegal drugs.  Some items may interact with your medicine. What should I watch for while using this medication? Your condition will be monitored carefully while you are receiving this medicine. You will need important blood work done while you are taking this medicine. This drug may make you feel generally unwell. This is not uncommon, as chemotherapy can affect healthy cells as well as cancer cells. Report any side effects. Continue your course of treatment even though you feel ill unless your doctor tells you to stop. In some cases, you may be given additional medicines to help with side effects. Follow all directions for their use. Call your doctor or health care professional for advice if you get a fever, chills or sore throat, or other symptoms of a cold or flu. Do not treat yourself. This drug decreases your body's ability to fight infections. Try to avoid being around people who are sick. This medicine may increase your risk to bruise or bleed. Call your doctor or health care professional if you notice any unusual bleeding. Be careful brushing and flossing your teeth or using a toothpick because you may get an infection or bleed more easily. If you have any dental work done, tell your dentist you are receiving this medicine. Avoid taking products that contain aspirin, acetaminophen, ibuprofen, naproxen, or ketoprofen unless instructed by your doctor. These medicines may hide a fever. Do not become pregnant while taking this medicine. Women should inform their doctor if they wish to become pregnant or think they might be pregnant. There is a potential for serious side effects to an unborn child. Talk to your health care professional or pharmacist for more information. Do not breast-feed an infant while taking this medicine. Men may have a lower sperm count while taking this medicine. Talk to your doctor if you plan to father a child. What side effects may I notice from receiving this medication? Side effects that  you should report to your doctor or health care professional as soon as possible: allergic reactions like skin rash, itching or hives, swelling of the face, lips, or tongue low blood counts - This drug may decrease the number of white blood cells, red blood cells and platelets. You may be at increased risk for infections and bleeding. signs of infection - fever or chills, cough, sore throat, pain or difficulty passing urine signs of decreased platelets or bleeding - bruising, pinpoint red spots on the skin, black, tarry stools, nosebleeds signs of decreased red blood cells - unusually weak or tired, fainting spells, lightheadedness breathing problems changes in hearing change in the amount of urine chest pain high blood pressure mouth sores nausea and vomiting pain, swelling, redness or irritation at the injection site pain, tingling, numbness in the hands or feet problems with balance, dizziness seizures Side effects that usually do not  require medical attention (report to your doctor or health care professional if they continue or are bothersome): constipation hair loss jaw pain loss of appetite sensitivity to light stomach pain tumor pain This list may not describe all possible side effects. Call your doctor for medical advice about side effects. You may report side effects to FDA at 1-800-FDA-1088. Where should I keep my medication? This drug is given in a hospital or clinic and will not be stored at home. NOTE: This sheet is a summary. It may not cover all possible information. If you have questions about this medicine, talk to your doctor, pharmacist, or health care provider.  2022 Elsevier/Gold Standard (2021-03-17 00:00:00)  Dacarbazine, DTIC injection What is this medication? DACARBAZINE (da KAR ba zeen) is a chemotherapy drug. This medicine is used to treat skin cancer. It is also used with other medicines to treat Hodgkin's disease. This medicine may be used for other  purposes; ask your health care provider or pharmacist if you have questions. COMMON BRAND NAME(S): DTIC-Dome What should I tell my care team before I take this medication? They need to know if you have any of these conditions: infection (especially virus infection such as chickenpox, cold sores, or herpes) kidney disease liver disease low blood counts like low platelets, red blood cells, white blood cells recent radiation therapy an unusual or allergic reaction to dacarbazine, other chemotherapy agents, other medicines, foods, dyes, or preservatives pregnant or trying to get pregnant breast-feeding How should I use this medication? This drug is given as an injection or infusion into a vein. It is administered in a hospital or clinic by a specially trained health care professional. Talk to your pediatrician regarding the use of this medicine in children. While this drug may be prescribed for selected conditions, precautions do apply. Overdosage: If you think you have taken too much of this medicine contact a poison control center or emergency room at once. NOTE: This medicine is only for you. Do not share this medicine with others. What if I miss a dose? It is important not to miss your dose. Call your doctor or health care professional if you are unable to keep an appointment. What may interact with this medication? medicines to increase blood counts like filgrastim, pegfilgrastim, sargramostim vaccines This list may not describe all possible interactions. Give your health care provider a list of all the medicines, herbs, non-prescription drugs, or dietary supplements you use. Also tell them if you smoke, drink alcohol, or use illegal drugs. Some items may interact with your medicine. What should I watch for while using this medication? Your condition will be monitored carefully while you are receiving this medicine. You will need important blood work done while you are taking this  medicine. This drug may make you feel generally unwell. This is not uncommon, as chemotherapy can affect healthy cells as well as cancer cells. Report any side effects. Continue your course of treatment even though you feel ill unless your doctor tells you to stop. Call your doctor or health care professional for advice if you get a fever, chills or sore throat, or other symptoms of a cold or flu. Do not treat yourself. This drug decreases your body's ability to fight infections. Try to avoid being around people who are sick. This medicine may increase your risk to bruise or bleed. Call your doctor or health care professional if you notice any unusual bleeding. Talk to your doctor about your risk of cancer. You may be more at  risk for certain types of cancers if you take this medicine. Do not become pregnant while taking this medicine. Women should inform their doctor if they wish to become pregnant or think they might be pregnant. There is a potential for serious side effects to an unborn child. Talk to your health care professional or pharmacist for more information. Do not breast-feed an infant while taking this medicine. What side effects may I notice from receiving this medication? Side effects that you should report to your doctor or health care professional as soon as possible: allergic reactions like skin rash, itching or hives, swelling of the face, lips, or tongue low blood counts - this medicine may decrease the number of white blood cells, red blood cells and platelets. You may be at increased risk for infections and bleeding. signs of infection - fever or chills, cough, sore throat, pain or difficulty passing urine signs of decreased platelets or bleeding - bruising, pinpoint red spots on the skin, black, tarry stools, blood in the urine signs of decreased red blood cells - unusually weak or tired, fainting spells, lightheadedness breathing problems muscle pains pain at site where  injected trouble passing urine or change in the amount of urine vomiting yellowing of the eyes or skin Side effects that usually do not require medical attention (report to your doctor or health care professional if they continue or are bothersome): diarrhea hair loss loss of appetite nausea skin more sensitive to sun or ultraviolet light stomach upset This list may not describe all possible side effects. Call your doctor for medical advice about side effects. You may report side effects to FDA at 1-800-FDA-1088. Where should I keep my medication? This drug is given in a hospital or clinic and will not be stored at home. NOTE: This sheet is a summary. It may not cover all possible information. If you have questions about this medicine, talk to your doctor, pharmacist, or health care provider.  2022 Elsevier/Gold Standard (2016-01-14 00:00:00)  Brentuximab vedotin solution for injection What is this medication? BRENTUXIMAB VEDOTIN (bren TUX see mab ve DOE tin) is a monoclonal antibody and a chemotherapy drug. It is used for treating Hodgkin lymphoma and certain non-Hodgkin lymphomas, such as anaplastic large-cell lymphoma, mycosis fungoides, and peripheral T-cell lymphoma. This medicine may be used for other purposes; ask your health care provider or pharmacist if you have questions. COMMON BRAND NAME(S): ADCETRIS What should I tell my care team before I take this medication? They need to know if you have any of these conditions: immune system problems infection (especially a virus infection such as chickenpox, cold sores, or herpes) kidney disease liver disease low blood counts, like low white cell, platelet, or red cell counts tingling of the fingers or toes, or other nerve disorder an unusual or allergic reaction to brentuximab vedotin, other medicines, foods, dyes, or preservatives pregnant or trying to get pregnant breast-feeding How should I use this medication? This medicine  is for infusion into a vein. It is given by a health care professional in a hospital or clinic setting. Talk to your pediatrician regarding the use of this medicine in children. Special care may be needed. Overdosage: If you think you have taken too much of this medicine contact a poison control center or emergency room at once. NOTE: This medicine is only for you. Do not share this medicine with others. What if I miss a dose? It is important not to miss your dose. Call your doctor or health care professional  if you are unable to keep an appointment. What may interact with this medication? Do not take this medicine with any of the following medications: bleomycin This medicine may also interact with the following medications: ketoconazole rifampin St. John's wort; Hypericum perforatum This list may not describe all possible interactions. Give your health care provider a list of all the medicines, herbs, non-prescription drugs, or dietary supplements you use. Also tell them if you smoke, drink alcohol, or use illegal drugs. Some items may interact with your medicine. What should I watch for while using this medication? Visit your doctor for checks on your progress. This drug may make you feel generally unwell. Report any side effects. Continue your course of treatment even though you feel ill unless your doctor tells you to stop. Call your doctor or health care professional for advice if you get a fever, chills or sore throat, or other symptoms of a cold or flu. Do not treat yourself. This drug decreases your body's ability to fight infections. Try to avoid being around people who are sick. This medicine may increase your risk to bruise or bleed. Call your doctor or health care professional if you notice any unusual bleeding. In some patients, this medicine may cause a serious brain infection that may cause death. If you have any problems seeing, thinking, speaking, walking, or standing, tell your  doctor right away. If you cannot reach your doctor, urgently seek other source of medical care. Do not become pregnant while taking this medicine or for 6 months after stopping it. Women should inform their doctor if they wish to become pregnant or think they might be pregnant. Men should not father a child while taking this medicine and for 6 months after stopping it. There is a potential for serious side effects to an unborn child. Talk to your health care professional or pharmacist for more information. Do not breast-feed an infant while taking this medicine. This may interfere with the ability to father a child. You should talk to your doctor or health care professional if you are concerned about your fertility. What side effects may I notice from receiving this medication? Side effects that you should report to your doctor or health care professional as soon as possible: allergic reactions like skin rash, itching or hives, swelling of the face, lips, or tongue changes in emotions or moods diarrhea low blood counts - this medicine may decrease the number of white blood cells, red blood cells and platelets. You may be at increased risk for infections and bleeding. pain, tingling, numbness in the hands or feet redness, blistering, peeling or loosening of the skin, including inside the mouth shortness of breath signs of infection - fever or chills, cough, sore throat, pain or difficulty passing urine signs of decreased platelets or bleeding - bruising, pinpoint red spots on the skin, black, tarry stools, blood in the urine signs of decreased red blood cells - unusually weak or tired, fainting spells, lightheadedness signs of liver injury like dark yellow or brown urine; general ill feeling or flu-like symptoms; light-colored stools; loss of appetite; nausea; right upper belly pain; yellowing of the eyes or skin stomach pain sudden numbness or weakness of the face, arm or leg vomiting Side effects  that usually do not require medical attention (report to your doctor or health care professional if they continue or are bothersome): constipation dizziness headache muscle pain tiredness This list may not describe all possible side effects. Call your doctor for medical advice about side  effects. You may report side effects to FDA at 1-800-FDA-1088. Where should I keep my medication? This drug is given in a hospital or clinic and will not be stored at home. NOTE: This sheet is a summary. It may not cover all possible information. If you have questions about this medicine, talk to your doctor, pharmacist, or health care provider.  2022 Elsevier/Gold Standard (2021-03-17 00:00:00)

## 2021-07-01 NOTE — Progress Notes (Signed)
Hematology/Oncology Consult note [facility]  Telephone:([phone] [phone]  Patient Care Team: Maryland Pink, MD as PCP - General (Family Medicine)   Name of the patient: Erik Buchanan  [id number]  [birth year]   Date of visit: 07/01/21  Diagnosis-stage III classical Hodgkin's lymphoma  Chief complaint/ Reason for visit-on treatment assessment prior to cycle 1 day 15 of BV AVD chemotherapy  Heme/Onc history: patient is a 60 year old male with a past medical history significant for hypertension and diabetes among other medical problems.  He was noted to have left-sided neck swelling which has been ongoing for the last 4 months but he feels that the swelling has slowly increased in size.  He was seen by Dr. Kary Kos and underwent CT chest and CT soft tissue neck after initial ultrasound of the neck.  CT showed bulky supraclavicular adenopathy with the largest node measuring 4.1 cm CT chest with contrast also showed similar findings and no evidence of right supraclavicular axillary mediastinal or hilar adenopathy.  Lungs were clear.  Multiple hypodense lesions in the spleen which nearly replaces the splenic parenchyma.  Spleen however does not appear enlarged.    PET CT scan showed FDG avid adenopathy within the lower left neck to left supraclavicular region as well as small bowel mesentery peritoneum and porta hepatic lesions.  Multiple FDG avid splenic lesions.  Mild diffuse increased uptake in the bone marrow nonspecific.  No focal areas of increased uptake to suggest bone metastases.   Excisional supraclavicular lymph node biopsy showed classical Hodgkin's lymphoma. The H&E stained sections reveal lymph node effaced by a somewhat nodular proliferation of large, abnormal lymphoid cells, including "mummified" cells, "lacunar" cells, and occasional binucleated cells with classic Reed-Sternberg morphology, in a background of predominantly small  lymphocytes.  EBV negative   Stage III classical Hodgkin's lymphoma IPI score 2.5-year freedom from progression of 80% and 91% overall survival.  Interval history-cycle 2-day 1 was pushed out 1 week due to spider bite on dorsal side of left hand.  He was given PO Bactrim for 7 days with near resolution of his symptoms. Denies any fevers.  He did speak to his PCP regarding his blood sugars and glipizide was restarted along with his metformin.  He does not check his blood sugars at home regularly.  After his last infusion, had some significant bony aching and pain and was prescribed oxycodone with symptom control.  He takes Claritin and Tylenol daily.  Also admits to some nausea a few days following his treatments.  ECOG PS- 1 Pain scale- 0   Review of systems- Review of Systems  Constitutional:  Negative for chills, fever, malaise/fatigue and weight loss.  HENT:  Negative for congestion, ear discharge and nosebleeds.   Eyes:  Negative for blurred vision.  Respiratory:  Negative for cough, hemoptysis, sputum production, shortness of breath and wheezing.   Cardiovascular:  Negative for chest pain, palpitations, orthopnea and claudication.  Gastrointestinal:  Positive for nausea. Negative for abdominal pain, blood in stool, constipation, diarrhea, heartburn, melena and vomiting.  Genitourinary:  Negative for dysuria, flank pain, frequency, hematuria and urgency.  Musculoskeletal:  Positive for joint pain and myalgias. Negative for back pain.  Skin:  Positive for rash.  Neurological:  Negative for dizziness, tingling, focal weakness, seizures, weakness and headaches.  Endo/Heme/Allergies:  Does not bruise/bleed easily.  Psychiatric/Behavioral:  Negative for depression and suicidal ideas. The patient does not have insomnia.       Allergies  Allergen Reactions  Ultram [Tramadol Hcl] Other (See Comments)    vertigo and passed out     Past Medical History:  Diagnosis Date   Anemia     Colon polyps    Depression    Diabetes mellitus without complication (HCC)    GERD (gastroesophageal reflux disease)    Hemorrhoids    Hyperlipidemia    Hypertension    Pneumonia    Testicular hypofunction    Tinnitus      Past Surgical History:  Procedure Laterality Date   COLONOSCOPY WITH PROPOFOL N/A 04/07/2015   Procedure: COLONOSCOPY WITH PROPOFOL;  Surgeon: Hulen Luster, MD;  Location: [facility] ENDOSCOPY;  Service: Gastroenterology;  Laterality: N/A;   COLONOSCOPY WITH PROPOFOL N/A 09/01/2020   Procedure: COLONOSCOPY WITH PROPOFOL;  Surgeon: Toledo, Benay Pike, MD;  Location: [facility] ENDOSCOPY;  Service: Gastroenterology;  Laterality: N/A;   EYE SURGERY     cataract   MASS BIOPSY Left 05/07/2021   Procedure: NECK MASS BIOPSY,;  Surgeon: Jules Husbands, MD;  Location: [facility] ORS;  Service: General;  Laterality: Left;  Provider requesting 1.5 hours / 90 minutes for procedure.   PORTACATH PLACEMENT Right 05/21/2021   Procedure: INSERTION PORT-A-CATH;  Surgeon: Jules Husbands, MD;  Location: [facility] ORS;  Service: General;  Laterality: Right;    Social History   Socioeconomic History   Marital status: Married    Spouse name: [name]   Number of children: Not on file   Years of education: Not on file   Highest education level: Not on file  Occupational History   Not on file  Tobacco Use   Smoking status: Never   Smokeless tobacco: Never  Vaping Use   Vaping Use: Never used  Substance and Sexual Activity   Alcohol use: Never   Drug use: Never   Sexual activity: Yes  Other Topics Concern   Not on file  Social History Narrative   Not on file   Social Determinants of Health   Financial Resource Strain: Not on file  Food Insecurity: Not on file  Transportation Needs: Not on file  Physical Activity: Not on file  Stress: Not on file  Social Connections: Not on file  Intimate Partner Violence: Not on file    Family History  Problem Relation Age of Onset   Arthritis Mother     Stroke Paternal Aunt      Current Outpatient Medications:    acidophilus (RISAQUAD) CAPS capsule, Take 1 capsule by mouth every evening., Disp: , Rfl:    amLODipine (NORVASC) 5 MG tablet, Take 7.5 mg by mouth daily., Disp: , Rfl:    buPROPion (WELLBUTRIN XL) 150 MG 24 hr tablet, Take 450 mg by mouth daily., Disp: , Rfl:    buPROPion (WELLBUTRIN XL) 150 MG 24 hr tablet, Take 3 tablets by mouth daily., Disp: , Rfl:    dexamethasone (DECADRON) 4 MG tablet, Take 2 tablets by mouth once a day for 3 days after chemo. Take with food., Disp: 30 tablet, Rfl: 1   glipiZIDE (GLUCOTROL XL) 5 MG 24 hr tablet, Take 5 mg by mouth daily., Disp: , Rfl:    Homeopathic Products (LEG CRAMPS) TABS, Take by mouth as needed. Pt taking for leg cramps., Disp: , Rfl:    ibuprofen (ADVIL,MOTRIN) 200 MG tablet, Take 200-400 mg by mouth every 6 (six) hours as needed for moderate pain., Disp: , Rfl:    lidocaine-prilocaine (EMLA) cream, Apply to affected area once, Disp: 30 g, Rfl: 3   lisinopril (PRINIVIL,ZESTRIL)  10 MG tablet, Take 10 mg by mouth daily., Disp: , Rfl:    loratadine (CLARITIN) 10 MG tablet, Take 10 mg by mouth daily as needed for allergies., Disp: , Rfl:    LORazepam (ATIVAN) 0.5 MG tablet, Take 1 tablet (0.5 mg total) by mouth every 6 (six) hours as needed (Nausea or vomiting)., Disp: 30 tablet, Rfl: 0   metFORMIN (GLUCOPHAGE-XR) 500 MG 24 hr tablet, Take 1,000 mg by mouth in the morning and at bedtime., Disp: , Rfl:    Multiple Vitamin (MULTIVITAMIN PO), Take 1 Package by mouth daily., Disp: , Rfl:    ondansetron (ZOFRAN) 4 MG tablet, Take 4 mg by mouth every 6 (six) hours., Disp: , Rfl:    ondansetron (ZOFRAN) 8 MG tablet, Take 1 tablet (8 mg total) by mouth 2 (two) times daily as needed. Start on the third day after chemotherapy., Disp: 30 tablet, Rfl: 1   oxyCODONE (OXY IR/ROXICODONE) 5 MG immediate release tablet, Take 1 tablet (5 mg total) by mouth every 4 (four) hours as needed for severe pain.,  Disp: 30 tablet, Rfl: 0   pantoprazole (PROTONIX) 20 MG tablet, Take 1 tablet (20 mg total) by mouth daily., Disp: 30 tablet, Rfl: 1   prochlorperazine (COMPAZINE) 10 MG tablet, Take 1 tablet (10 mg total) by mouth every 6 (six) hours as needed (Nausea or vomiting)., Disp: 30 tablet, Rfl: 1   simvastatin (ZOCOR) 20 MG tablet, Take 20 mg by mouth every evening., Disp: , Rfl:    sodium chloride (OCEAN) 0.65 % SOLN nasal spray, Place 1 spray into both nostrils as needed for congestion., Disp: , Rfl:    sulfamethoxazole-trimethoprim (BACTRIM DS) 800-160 MG tablet, Take 1 tablet by mouth 2 (two) times daily., Disp: 14 tablet, Rfl: 0   Testosterone 1.62 % GEL, Apply 2 Pump topically in the morning. Applied to shoulders/upper arms, Disp: , Rfl:    tetrahydrozoline-zinc (VISINE-AC) 0.05-0.25 % ophthalmic solution, Place 2 drops into both eyes 3 (three) times daily as needed (dry eyes)., Disp: , Rfl:    Triamcinolone Acetonide (NASACORT AQ NA), Place 2 sprays into both nostrils daily as needed (allergies)., Disp: , Rfl:  No current facility-administered medications for this visit.  Facility-Administered Medications Ordered in Other Visits:    brentuximab vedotin (ADCETRIS) 120 mg in sodium chloride 0.9 % 100 mL chemo infusion, 120 mg, Intravenous, Once, Sindy Guadeloupe, MD   dacarbazine (DTIC) 890 mg in sodium chloride 0.9 % 250 mL chemo infusion, 375 mg/m2 (Treatment Plan Recorded), Intravenous, Once, Sindy Guadeloupe, MD, Last Rate: 339 mL/hr at 07/01/21 1249, 890 mg at 07/01/21 1249   heparin lock flush 100 UNIT/ML injection, , , ,    heparin lock flush 100 unit/mL, 500 Units, Intracatheter, Once PRN, Sindy Guadeloupe, MD   sodium chloride flush (NS) 0.9 % injection 10 mL, 10 mL, Intracatheter, PRN, Sindy Guadeloupe, MD  Physical exam:  Vitals:   07/01/21 1050  BP: 139/79  Pulse: 73  Temp: (!) 96.7 F (35.9 C)  TempSrc: Temporal  SpO2: 100%  Weight: 239 lb 6.4 oz (108.6 kg)  Height: 5\' 11"  (1.803 m)    Physical Exam Constitutional:      Appearance: Normal appearance.  HENT:     Head: Normocephalic and atraumatic.  Eyes:     Pupils: Pupils are equal, round, and reactive to light.  Cardiovascular:     Rate and Rhythm: Normal rate and regular rhythm.     Heart sounds: Normal heart sounds. No  murmur heard. Pulmonary:     Effort: Pulmonary effort is normal.     Breath sounds: Normal breath sounds. No wheezing.  Abdominal:     General: Bowel sounds are normal. There is no distension.     Palpations: Abdomen is soft.     Tenderness: There is no abdominal tenderness.  Musculoskeletal:        General: Normal range of motion.     Cervical back: Normal range of motion.  Skin:    General: Skin is warm and dry.     Findings: No rash.     Comments: Left hand-healing wound. No erythema or induration.   Neurological:     Mental Status: He is alert and oriented to person, place, and time.  Psychiatric:        Judgment: Judgment normal.     CMP Latest Ref Rng & Units 07/01/2021  Glucose 70 - 99 mg/dL 175(H)  BUN 6 - 20 mg/dL 13  Creatinine 0.61 - 1.24 mg/dL 0.84  Sodium 135 - 145 mmol/L 136  Potassium 3.5 - 5.1 mmol/L 3.9  Chloride 98 - 111 mmol/L 103  CO2 22 - 32 mmol/L 25  Calcium 8.9 - 10.3 mg/dL 9.1  Total Protein 6.5 - 8.1 g/dL 6.7  Total Bilirubin 0.3 - 1.2 mg/dL 0.6  Alkaline Phos 38 - 126 U/L 69  AST 15 - 41 U/L 19  ALT 0 - 44 U/L 26   CBC Latest Ref Rng & Units 07/01/2021  WBC 4.0 - 10.5 K/uL 7.7  Hemoglobin 13.0 - 17.0 g/dL 11.0(L)  Hematocrit 39.0 - 52.0 % 32.5(L)  Platelets 150 - 400 K/uL 387    No images are attached to the encounter.  No results found.  Assessment and plan- Patient is a 60 y.o. male with stage III classical Hodgkin's lymphoma here for on treatment assessment prior to cycle 2 day 1 of BV AVD chemotherapy.  Counts are okay to proceed with cycle 2-day 1 of BV AVD chemotherapy with Udenyca on day 3.  He is already scheduled to return to clinic  for cycle 2-day 15.  Plan is to complete 6 total cycles.  Anemia-stable.  Hyperglycemia-blood sugars were running as high as 400 due to steroids with his chemotherapy.  Discussed with his PCP who has added glipizide and has improved his blood sugars.  Today's blood sugar was 175.  Continue metformin and glipizide per PCP.  Body aches and pains-secondary to Udenyca.  Continue both Claritin and Tylenol.  He was also given a prescription for oxycodone which he takes sparingly but as needed for pain for about 4 to 5 days after his injection.  Nausea-mild but does take his antiemetics as needed.  Spider bite on left hand-significant improvement.  Completed Bactrim DS yesterday.  Disposition- Proceed with cycle 2-day 1 of BV AVD chemotherapy with Udenyca on day 3. He is already scheduled for follow-up with Dr. Janese Banks on 07/15/2021.   I spent 25 minutes dedicated to the care of this patient (face-to-face and non-face-to-face) on the date of the encounter to include what is described in the assessment and plan.   Visit Diagnosis 1. Other classical Hodgkin lymphoma of lymph nodes of multiple regions [facility])    Faythe Casa, NP 07/01/2021 1:03 PM

## 2021-07-01 NOTE — Progress Notes (Signed)
Pt did lose 5 lbs from last visit, he is trying to watch his wt and what he eats dur to diabetes. He stopped the Vit b12 due to leg cramps and only takes prn.

## 2021-07-03 ENCOUNTER — Other Ambulatory Visit: Payer: Self-pay

## 2021-07-03 ENCOUNTER — Inpatient Hospital Stay: Payer: 59

## 2021-07-03 DIAGNOSIS — C8190 Hodgkin lymphoma, unspecified, unspecified site: Secondary | ICD-10-CM | POA: Diagnosis not present

## 2021-07-03 DIAGNOSIS — C8178 Other classical Hodgkin lymphoma, lymph nodes of multiple sites: Secondary | ICD-10-CM

## 2021-07-03 MED ORDER — PEGFILGRASTIM-BMEZ 6 MG/0.6ML ~~LOC~~ SOSY
6.0000 mg | PREFILLED_SYRINGE | Freq: Once | SUBCUTANEOUS | Status: AC
  Administered 2021-07-03: 12:00:00 6 mg via SUBCUTANEOUS
  Filled 2021-07-03: qty 0.6

## 2021-07-08 ENCOUNTER — Ambulatory Visit: Payer: 59

## 2021-07-08 ENCOUNTER — Ambulatory Visit: Payer: 59 | Admitting: Oncology

## 2021-07-08 ENCOUNTER — Other Ambulatory Visit: Payer: 59

## 2021-07-09 ENCOUNTER — Encounter: Payer: Self-pay | Admitting: Oncology

## 2021-07-10 ENCOUNTER — Ambulatory Visit: Payer: 59

## 2021-07-15 ENCOUNTER — Encounter: Payer: Self-pay | Admitting: Oncology

## 2021-07-15 ENCOUNTER — Inpatient Hospital Stay: Payer: BC Managed Care – PPO | Attending: Oncology | Admitting: Oncology

## 2021-07-15 ENCOUNTER — Inpatient Hospital Stay: Payer: BC Managed Care – PPO

## 2021-07-15 ENCOUNTER — Other Ambulatory Visit: Payer: Self-pay

## 2021-07-15 VITALS — BP 152/82 | HR 79 | Temp 97.0°F | Resp 18 | Wt 240.0 lb

## 2021-07-15 DIAGNOSIS — C8178 Other classical Hodgkin lymphoma, lymph nodes of multiple sites: Secondary | ICD-10-CM | POA: Diagnosis not present

## 2021-07-15 DIAGNOSIS — C8198 Hodgkin lymphoma, unspecified, lymph nodes of multiple sites: Secondary | ICD-10-CM | POA: Diagnosis not present

## 2021-07-15 DIAGNOSIS — Z5189 Encounter for other specified aftercare: Secondary | ICD-10-CM | POA: Insufficient documentation

## 2021-07-15 DIAGNOSIS — I10 Essential (primary) hypertension: Secondary | ICD-10-CM | POA: Insufficient documentation

## 2021-07-15 DIAGNOSIS — Z79899 Other long term (current) drug therapy: Secondary | ICD-10-CM | POA: Insufficient documentation

## 2021-07-15 DIAGNOSIS — Z7985 Long-term (current) use of injectable non-insulin antidiabetic drugs: Secondary | ICD-10-CM | POA: Insufficient documentation

## 2021-07-15 DIAGNOSIS — C8190 Hodgkin lymphoma, unspecified, unspecified site: Secondary | ICD-10-CM | POA: Insufficient documentation

## 2021-07-15 DIAGNOSIS — E119 Type 2 diabetes mellitus without complications: Secondary | ICD-10-CM | POA: Insufficient documentation

## 2021-07-15 DIAGNOSIS — Z5112 Encounter for antineoplastic immunotherapy: Secondary | ICD-10-CM | POA: Insufficient documentation

## 2021-07-15 DIAGNOSIS — Z5111 Encounter for antineoplastic chemotherapy: Secondary | ICD-10-CM | POA: Diagnosis present

## 2021-07-15 DIAGNOSIS — Z7984 Long term (current) use of oral hypoglycemic drugs: Secondary | ICD-10-CM | POA: Insufficient documentation

## 2021-07-15 DIAGNOSIS — R739 Hyperglycemia, unspecified: Secondary | ICD-10-CM | POA: Insufficient documentation

## 2021-07-15 LAB — CBC WITH DIFFERENTIAL/PLATELET
Abs Immature Granulocytes: 0.11 10*3/uL — ABNORMAL HIGH (ref 0.00–0.07)
Basophils Absolute: 0 10*3/uL (ref 0.0–0.1)
Basophils Relative: 1 %
Eosinophils Absolute: 0.1 10*3/uL (ref 0.0–0.5)
Eosinophils Relative: 1 %
HCT: 32.1 % — ABNORMAL LOW (ref 39.0–52.0)
Hemoglobin: 10.9 g/dL — ABNORMAL LOW (ref 13.0–17.0)
Immature Granulocytes: 1 %
Lymphocytes Relative: 14 %
Lymphs Abs: 1.2 10*3/uL (ref 0.7–4.0)
MCH: 27 pg (ref 26.0–34.0)
MCHC: 34 g/dL (ref 30.0–36.0)
MCV: 79.5 fL — ABNORMAL LOW (ref 80.0–100.0)
Monocytes Absolute: 0.5 10*3/uL (ref 0.1–1.0)
Monocytes Relative: 5 %
Neutro Abs: 6.6 10*3/uL (ref 1.7–7.7)
Neutrophils Relative %: 78 %
Platelets: 169 10*3/uL (ref 150–400)
RBC: 4.04 MIL/uL — ABNORMAL LOW (ref 4.22–5.81)
RDW: 17.2 % — ABNORMAL HIGH (ref 11.5–15.5)
WBC: 8.5 10*3/uL (ref 4.0–10.5)
nRBC: 0 % (ref 0.0–0.2)

## 2021-07-15 LAB — COMPREHENSIVE METABOLIC PANEL
ALT: 26 U/L (ref 0–44)
AST: 20 U/L (ref 15–41)
Albumin: 4 g/dL (ref 3.5–5.0)
Alkaline Phosphatase: 88 U/L (ref 38–126)
Anion gap: 7 (ref 5–15)
BUN: 15 mg/dL (ref 6–20)
CO2: 23 mmol/L (ref 22–32)
Calcium: 8.7 mg/dL — ABNORMAL LOW (ref 8.9–10.3)
Chloride: 102 mmol/L (ref 98–111)
Creatinine, Ser: 0.96 mg/dL (ref 0.61–1.24)
GFR, Estimated: >60 mL/min (ref >60)
Glucose, Bld: 364 mg/dL — ABNORMAL HIGH (ref 70–99)
Potassium: 3.9 mmol/L (ref 3.5–5.1)
Sodium: 132 mmol/L — ABNORMAL LOW (ref 135–145)
Total Bilirubin: 0.3 mg/dL (ref 0.3–1.2)
Total Protein: 6.7 g/dL (ref 6.5–8.1)

## 2021-07-15 MED ORDER — SODIUM CHLORIDE 0.9 % IV SOLN
120.0000 mg | Freq: Once | INTRAVENOUS | Status: AC
  Administered 2021-07-15: 120 mg via INTRAVENOUS
  Filled 2021-07-15: qty 24

## 2021-07-15 MED ORDER — DEXAMETHASONE SODIUM PHOSPHATE 10 MG/ML IJ SOLN
4.0000 mg | Freq: Once | INTRAMUSCULAR | Status: AC
  Administered 2021-07-15: 4 mg via INTRAVENOUS
  Filled 2021-07-15: qty 1

## 2021-07-15 MED ORDER — VINBLASTINE SULFATE CHEMO INJECTION 1 MG/ML
6.0000 mg/m2 | Freq: Once | INTRAVENOUS | Status: AC
  Administered 2021-07-15: 14.2 mg via INTRAVENOUS
  Filled 2021-07-15: qty 14.2

## 2021-07-15 MED ORDER — ACETAMINOPHEN 325 MG PO TABS
650.0000 mg | ORAL_TABLET | Freq: Once | ORAL | Status: AC
  Administered 2021-07-15: 650 mg via ORAL
  Filled 2021-07-15: qty 2

## 2021-07-15 MED ORDER — SODIUM CHLORIDE 0.9 % IV SOLN
Freq: Once | INTRAVENOUS | Status: AC
  Filled 2021-07-15: qty 250

## 2021-07-15 MED ORDER — PALONOSETRON HCL INJECTION 0.25 MG/5ML
0.2500 mg | Freq: Once | INTRAVENOUS | Status: AC
  Administered 2021-07-15: 0.25 mg via INTRAVENOUS
  Filled 2021-07-15: qty 5

## 2021-07-15 MED ORDER — PROCHLORPERAZINE MALEATE 10 MG PO TABS
10.0000 mg | ORAL_TABLET | Freq: Once | ORAL | Status: AC
  Administered 2021-07-15: 10 mg via ORAL
  Filled 2021-07-15: qty 1

## 2021-07-15 MED ORDER — HEPARIN SOD (PORK) LOCK FLUSH 100 UNIT/ML IV SOLN
500.0000 [IU] | Freq: Once | INTRAVENOUS | Status: AC | PRN
  Administered 2021-07-15: 500 [IU]
  Filled 2021-07-15: qty 5

## 2021-07-15 MED ORDER — DIPHENHYDRAMINE HCL 50 MG/ML IJ SOLN
50.0000 mg | Freq: Once | INTRAMUSCULAR | Status: AC
  Administered 2021-07-15: 50 mg via INTRAVENOUS
  Filled 2021-07-15: qty 1

## 2021-07-15 MED ORDER — DOXORUBICIN HCL CHEMO IV INJECTION 2 MG/ML
25.0000 mg/m2 | Freq: Once | INTRAVENOUS | Status: AC
  Administered 2021-07-15: 60 mg via INTRAVENOUS
  Filled 2021-07-15: qty 30

## 2021-07-15 MED ORDER — SODIUM CHLORIDE 0.9 % IV SOLN
150.0000 mg | Freq: Once | INTRAVENOUS | Status: AC
  Administered 2021-07-15: 150 mg via INTRAVENOUS
  Filled 2021-07-15: qty 150

## 2021-07-15 MED ORDER — SODIUM CHLORIDE 0.9 % IV SOLN: 4.0000 mg | Freq: Once | INTRAVENOUS | Status: DC

## 2021-07-15 MED ORDER — SODIUM CHLORIDE 0.9 % IV SOLN
375.0000 mg/m2 | Freq: Once | INTRAVENOUS | Status: AC
  Administered 2021-07-15: 890 mg via INTRAVENOUS
  Filled 2021-07-15: qty 89

## 2021-07-15 NOTE — Progress Notes (Signed)
Hematology/Oncology Consult note [facility]  [phone] [phone]  Patient Care Team: [place] Pink, MD as PCP - General (Family Medicine)   Name of the patient: Erik Buchanan  [id number]  [birth year]   Date of visit: 07/15/21  Diagnosis- stage III classical Hodgkin's lymphoma  Chief complaint/ Reason for visit-on treatment assessment prior to cycle 2-day 15 of BV AVD chemotherapy  Heme/Onc history: patient is a 61 year old male with a past medical history significant for hypertension and diabetes among other medical problems.  He was noted to have left-sided neck swelling which has been ongoing for the last 4 months but he feels that the swelling has slowly increased in size.  He was seen by Dr. Kary Kos and underwent CT chest and CT soft tissue neck after initial ultrasound of the neck.  CT showed bulky supraclavicular adenopathy with the largest node measuring 4.1 cm CT chest with contrast also showed similar findings and no evidence of right supraclavicular axillary mediastinal or hilar adenopathy.  Lungs were clear.  Multiple hypodense lesions in the spleen which nearly replaces the splenic parenchyma.  Spleen however does not appear enlarged.    PET CT scan showed FDG avid adenopathy within the lower left neck to left supraclavicular region as well as small bowel mesentery peritoneum and porta hepatic lesions.  Multiple FDG avid splenic lesions.  Mild diffuse increased uptake in the bone marrow nonspecific.  No focal areas of increased uptake to suggest bone metastases.   Excisional supraclavicular lymph node biopsy showed classical Hodgkin's lymphoma. The H&E stained sections reveal lymph node effaced by a somewhat nodular proliferation of large, abnormal lymphoid cells, including "mummified" cells, "lacunar" cells, and occasional binucleated cells with classic Reed-Sternberg morphology, in a background of predominantly small  lymphocytes.  EBV negative   Stage III classical Hodgkin's lymphoma IPI score 2.5-year freedom from progression of 80% and 91% overall survival.    Interval history-tolerating treatment well so far.  He does report some achiness in his joints especially in his left forearm and hips after he receives chemotherapy which lasts for a few days.  Does not report any lingering symptoms of peripheral neuropathy.  Heartburn is currently well controlled with pantoprazole but he does report occasional stomach ache after chemotherapy.  ECOG PS- 1 Pain scale- 0   Review of systems- Review of Systems  Constitutional:  Negative for chills, fever, malaise/fatigue and weight loss.  HENT:  Negative for congestion, ear discharge and nosebleeds.   Eyes:  Negative for blurred vision.  Respiratory:  Negative for cough, hemoptysis, sputum production, shortness of breath and wheezing.   Cardiovascular:  Negative for chest pain, palpitations, orthopnea and claudication.  Gastrointestinal:  Negative for abdominal pain, blood in stool, constipation, diarrhea, heartburn, melena, nausea and vomiting.  Genitourinary:  Negative for dysuria, flank pain, frequency, hematuria and urgency.  Musculoskeletal:  Negative for back pain, joint pain and myalgias.  Skin:  Negative for rash.  Neurological:  Negative for dizziness, tingling, focal weakness, seizures, weakness and headaches.  Endo/Heme/Allergies:  Does not bruise/bleed easily.  Psychiatric/Behavioral:  Negative for depression and suicidal ideas. The patient does not have insomnia.       Allergies  Allergen Reactions   Ultram [Tramadol Hcl] Other (See Comments)    vertigo and passed out     Past Medical History:  Diagnosis Date   Anemia    Colon polyps    Depression    Diabetes mellitus without complication ([place])    GERD (  gastroesophageal reflux disease)    Hemorrhoids    Hyperlipidemia    Hypertension    Pneumonia    Testicular hypofunction     Tinnitus      Past Surgical History:  Procedure Laterality Date   COLONOSCOPY WITH PROPOFOL N/A 04/07/2015   Procedure: COLONOSCOPY WITH PROPOFOL;  Surgeon: Hulen Luster, MD;  Location: [facility] END[facility]COPY;  Service: Gastroenterology;  Laterality: N/A;   COLONOSCOPY WITH PROPOFOL N/A 09/01/2020   Procedure: COLONOSCOPY WITH PROPOFOL;  Surgeon: Toledo, Benay Pike, MD;  Location: [facility] ENDOSCOPY;  Service: Gastroenterology;  Laterality: N/A;   EYE SURGERY     cataract   MASS BIOPSY Left 05/07/2021   Procedure: NECK MASS BIOPSY,;  Surgeon: Jules Husbands, MD;  Location: [facility] ORS;  Service: General;  Laterality: Left;  Provider requesting 1.5 hours / 90 minutes for procedure.   PORTACATH PLACEMENT Right 05/21/2021   Procedure: INSERTION PORT-A-CATH;  Surgeon: Jules Husbands, MD;  Location: [facility] ORS;  Service: General;  Laterality: Right;    Social History   Socioeconomic History   Marital status: Married    Spouse name: [name]   Number of children: Not on file   Years of education: Not on file   Highest education level: Not on file  Occupational History   Not on file  Tobacco Use   Smoking status: Never   Smokeless tobacco: Never  Vaping Use   Vaping Use: Never used  Substance and Sexual Activity   Alcohol use: Never   Drug use: Never   Sexual activity: Yes  Other Topics Concern   Not on file  Social History Narrative   Not on file   Social Determinants of Health   Financial Resource Strain: Not on file  Food Insecurity: Not on file  Transportation Needs: Not on file  Physical Activity: Not on file  Stress: Not on file  Social Connections: Not on file  Intimate Partner Violence: Not on file    Family History  Problem Relation Age of Onset   Arthritis Mother    Stroke Paternal Aunt      Current Outpatient Medications:    acidophilus (RISAQUAD) CAPS capsule, Take 1 capsule by mouth every evening., Disp: , Rfl:    amLODipine (NORVASC) 5 MG tablet, Take 7.5 mg by mouth  daily., Disp: , Rfl:    buPROPion (WELLBUTRIN XL) 150 MG 24 hr tablet, Take 450 mg by mouth daily., Disp: , Rfl:    buPROPion (WELLBUTRIN XL) 150 MG 24 hr tablet, Take 3 tablets by mouth daily., Disp: , Rfl:    dexamethasone (DECADRON) 4 MG tablet, Take 2 tablets by mouth once a day for 3 days after chemo. Take with food., Disp: 30 tablet, Rfl: 1   glipiZIDE (GLUCOTROL XL) 5 MG 24 hr tablet, Take 5 mg by mouth daily., Disp: , Rfl:    Homeopathic Products (LEG CRAMPS) TABS, Take by mouth as needed. Pt taking for leg cramps., Disp: , Rfl:    ibuprofen (ADVIL,MOTRIN) 200 MG tablet, Take 200-400 mg by mouth every 6 (six) hours as needed for moderate pain., Disp: , Rfl:    lidocaine-prilocaine (EMLA) cream, Apply to affected area once, Disp: 30 g, Rfl: 3   lisinopril (PRINIVIL,ZESTRIL) 10 MG tablet, Take 10 mg by mouth daily., Disp: , Rfl:    loratadine (CLARITIN) 10 MG tablet, Take 10 mg by mouth daily as needed for allergies., Disp: , Rfl:    LORazepam (ATIVAN) 0.5 MG tablet, Take 1 tablet (0.5 mg total)  by mouth every 6 (six) hours as needed (Nausea or vomiting)., Disp: 30 tablet, Rfl: 0   metFORMIN (GLUCOPHAGE-XR) 500 MG 24 hr tablet, Take 1,000 mg by mouth in the morning and at bedtime., Disp: , Rfl:    Multiple Vitamin (MULTIVITAMIN PO), Take 1 Package by mouth daily., Disp: , Rfl:    ondansetron (ZOFRAN) 4 MG tablet, Take 4 mg by mouth every 6 (six) hours., Disp: , Rfl:    ondansetron (ZOFRAN) 8 MG tablet, Take 1 tablet (8 mg total) by mouth 2 (two) times daily as needed. Start on the third day after chemotherapy., Disp: 30 tablet, Rfl: 1   oxyCODONE (OXY IR/ROXICODONE) 5 MG immediate release tablet, Take 1 tablet (5 mg total) by mouth every 4 (four) hours as needed for severe pain., Disp: 30 tablet, Rfl: 0   pantoprazole (PROTONIX) 20 MG tablet, Take 1 tablet (20 mg total) by mouth daily., Disp: 30 tablet, Rfl: 1   simvastatin (ZOCOR) 20 MG tablet, Take 20 mg by mouth every evening., Disp: , Rfl:     sodium chloride (OCEAN) 0.65 % SOLN nasal spray, Place 1 spray into both nostrils as needed for congestion., Disp: , Rfl:    sulfamethoxazole-trimethoprim (BACTRIM DS) 800-160 MG tablet, Take 1 tablet by mouth 2 (two) times daily., Disp: 14 tablet, Rfl: 0   Testosterone 1.62 % GEL, Apply 2 Pump topically in the morning. Applied to shoulders/upper arms, Disp: , Rfl:    tetrahydrozoline-zinc (VISINE-AC) 0.05-0.25 % ophthalmic solution, Place 2 drops into both eyes 3 (three) times daily as needed (dry eyes)., Disp: , Rfl:    Triamcinolone Acetonide (NASACORT AQ NA), Place 2 sprays into both nostrils daily as needed (allergies)., Disp: , Rfl:   Physical exam:  Vitals:   07/15/21 0834  BP: (!) 152/82  Pulse: 79  Resp: 18  Temp: (!) 97 F (36.1 C)  TempSrc: Tympanic  SpO2: 98%  Weight: 240 lb (108.9 kg)   Physical Exam Cardiovascular:     Rate and Rhythm: Normal rate and regular rhythm.     Heart sounds: Normal heart sounds.  Pulmonary:     Effort: Pulmonary effort is normal.     Breath sounds: Normal breath sounds.  Lymphadenopathy:     Comments: No palpable cervical adenopathy  Skin:    General: Skin is warm and dry.  Neurological:     Mental Status: He is alert and oriented to person, place, and time.     CMP Latest Ref Rng & Units 07/15/2021  Glucose 70 - 99 mg/dL 364(H)  BUN 6 - 20 mg/dL 15  Creatinine 0.61 - 1.24 mg/dL 0.96  Sodium 135 - 145 mmol/L 132(L)  Potassium 3.5 - 5.1 mmol/L 3.9  Chloride 98 - 111 mmol/L 102  CO2 22 - 32 mmol/L 23  Calcium 8.9 - 10.3 mg/dL 8.7(L)  Total Protein 6.5 - 8.1 g/dL 6.7  Total Bilirubin 0.3 - 1.2 mg/dL 0.3  Alkaline Phos 38 - 126 U/L 88  AST 15 - 41 U/L 20  ALT 0 - 44 U/L 26   CBC Latest Ref Rng & Units 07/15/2021  WBC 4.0 - 10.5 K/uL 8.5  Hemoglobin 13.0 - 17.0 g/dL 10.9(L)  Hematocrit 39.0 - 52.0 % 32.1(L)  Platelets 150 - 400 K/uL 169     Assessment and plan- Patient is a 61 y.o. male  with stage III classical Hodgkin's  lymphoma.  He is here for on treatment assessment prior to cycle 2-day 15 of BV AVD chemotherapy  Counts okay to proceed with BV AVD chemotherapy today with growth factor support.  He is tolerating treatment well and has not had any significant lingering peripheral neuropathy associated with Brentuximab.  I will see him back in 2 weeks for cycle 3-day 1 of treatment and obtain a PET scan prior to assess interim response to treatment.  Clinically patient has responded very well with resolution of cervical adenopathy.   He does seem to have some Udenyca associated bone pain and have asked him to minimize the use of ibuprofen and take Tylenol and as needed oxycodone.  I am concerned that he could have gastritis as well because of ibuprofen use that is causing some abdominal pain.  His blood sugar is 364 today and his glipizide dose was increased just a few days ago.  I am reducing the dose of Decadron as premedication I also asked him to cut down on his Decadron use for the next 3 days for chemotherapy prophylaxis.   Visit Diagnosis 1. Other classical Hodgkin lymphoma of lymph nodes of multiple regions ([place])   2. Encounter for antineoplastic chemotherapy   3. Encounter for monoclonal antibody treatment for malignancy      Dr. Randa Evens, MD, MPH [facility] [phone] 07/15/2021 10:33 AM

## 2021-07-15 NOTE — Progress Notes (Signed)
Patient here for oncology follow-up appointment, concerns of neuropathy

## 2021-07-17 ENCOUNTER — Inpatient Hospital Stay: Payer: BC Managed Care – PPO

## 2021-07-17 ENCOUNTER — Other Ambulatory Visit: Payer: Self-pay

## 2021-07-17 DIAGNOSIS — C8198 Hodgkin lymphoma, unspecified, lymph nodes of multiple sites: Secondary | ICD-10-CM | POA: Diagnosis not present

## 2021-07-17 DIAGNOSIS — C8178 Other classical Hodgkin lymphoma, lymph nodes of multiple sites: Secondary | ICD-10-CM

## 2021-07-17 MED ORDER — PEGFILGRASTIM-BMEZ 6 MG/0.6ML ~~LOC~~ SOSY
6.0000 mg | PREFILLED_SYRINGE | Freq: Once | SUBCUTANEOUS | Status: AC
  Administered 2021-07-17: 6 mg via SUBCUTANEOUS
  Filled 2021-07-17: qty 0.6

## 2021-07-28 ENCOUNTER — Ambulatory Visit
Admission: RE | Admit: 2021-07-28 | Discharge: 2021-07-28 | Disposition: A | Discharge status: Home / Self Care | Payer: BC Managed Care – PPO | Source: Ambulatory Visit | Attending: Oncology | Admitting: Oncology

## 2021-07-28 ENCOUNTER — Other Ambulatory Visit: Payer: Self-pay

## 2021-07-28 DIAGNOSIS — C8178 Other classical Hodgkin lymphoma, lymph nodes of multiple sites: Secondary | ICD-10-CM | POA: Diagnosis present

## 2021-07-28 DIAGNOSIS — J320 Chronic maxillary sinusitis: Secondary | ICD-10-CM | POA: Insufficient documentation

## 2021-07-28 LAB — GLUCOSE, CAPILLARY: Glucose-Capillary: 242 mg/dL — ABNORMAL HIGH (ref 70–99)

## 2021-07-28 MED ORDER — FLUDEOXYGLUCOSE F - 18 (FDG) INJECTION
12.4000 | Freq: Once | INTRAVENOUS | Status: AC
  Administered 2021-07-28: 13.55 via INTRAVENOUS

## 2021-07-29 ENCOUNTER — Inpatient Hospital Stay (HOSPITAL_BASED_OUTPATIENT_CLINIC_OR_DEPARTMENT_OTHER): Payer: BC Managed Care – PPO | Admitting: Oncology

## 2021-07-29 ENCOUNTER — Inpatient Hospital Stay: Payer: BC Managed Care – PPO

## 2021-07-29 ENCOUNTER — Encounter: Payer: Self-pay | Admitting: Oncology

## 2021-07-29 VITALS — BP 133/77 | HR 92 | Temp 98.2°F | Resp 16 | Ht 71.0 in | Wt 237.5 lb

## 2021-07-29 DIAGNOSIS — R739 Hyperglycemia, unspecified: Secondary | ICD-10-CM | POA: Diagnosis not present

## 2021-07-29 DIAGNOSIS — C8176 Other classical Hodgkin lymphoma, intrapelvic lymph nodes: Secondary | ICD-10-CM | POA: Diagnosis not present

## 2021-07-29 DIAGNOSIS — Z5111 Encounter for antineoplastic chemotherapy: Secondary | ICD-10-CM

## 2021-07-29 DIAGNOSIS — C8178 Other classical Hodgkin lymphoma, lymph nodes of multiple sites: Secondary | ICD-10-CM

## 2021-07-29 DIAGNOSIS — Z5112 Encounter for antineoplastic immunotherapy: Secondary | ICD-10-CM

## 2021-07-29 DIAGNOSIS — C8198 Hodgkin lymphoma, unspecified, lymph nodes of multiple sites: Secondary | ICD-10-CM | POA: Diagnosis not present

## 2021-07-29 DIAGNOSIS — R110 Nausea: Secondary | ICD-10-CM

## 2021-07-29 LAB — COMPREHENSIVE METABOLIC PANEL
ALT: 29 U/L (ref 0–44)
AST: 20 U/L (ref 15–41)
Albumin: 4.1 g/dL (ref 3.5–5.0)
Alkaline Phosphatase: 98 U/L (ref 38–126)
Anion gap: 12 (ref 5–15)
BUN: 21 mg/dL — ABNORMAL HIGH (ref 6–20)
CO2: 22 mmol/L (ref 22–32)
Calcium: 9.2 mg/dL (ref 8.9–10.3)
Chloride: 99 mmol/L (ref 98–111)
Creatinine, Ser: 0.87 mg/dL (ref 0.61–1.24)
GFR, Estimated: >60 mL/min (ref >60)
Glucose, Bld: 381 mg/dL — ABNORMAL HIGH (ref 70–99)
Potassium: 4.1 mmol/L (ref 3.5–5.1)
Sodium: 133 mmol/L — ABNORMAL LOW (ref 135–145)
Total Bilirubin: 0.4 mg/dL (ref 0.3–1.2)
Total Protein: 6.9 g/dL (ref 6.5–8.1)

## 2021-07-29 LAB — IRON AND TIBC
Iron: 68 ug/dL (ref 45–182)
Saturation Ratios: 18 % (ref 17.9–39.5)
TIBC: 370 ug/dL (ref 250–450)
UIBC: 302 ug/dL

## 2021-07-29 LAB — CBC WITH DIFFERENTIAL/PLATELET
Abs Immature Granulocytes: 0.19 10*3/uL — ABNORMAL HIGH (ref 0.00–0.07)
Basophils Absolute: 0.1 10*3/uL (ref 0.0–0.1)
Basophils Relative: 1 %
Eosinophils Absolute: 0 10*3/uL (ref 0.0–0.5)
Eosinophils Relative: 0 %
HCT: 34.8 % — ABNORMAL LOW (ref 39.0–52.0)
Hemoglobin: 11.6 g/dL — ABNORMAL LOW (ref 13.0–17.0)
Immature Granulocytes: 2 %
Lymphocytes Relative: 13 %
Lymphs Abs: 1.4 10*3/uL (ref 0.7–4.0)
MCH: 27.3 pg (ref 26.0–34.0)
MCHC: 33.3 g/dL (ref 30.0–36.0)
MCV: 81.9 fL (ref 80.0–100.0)
Monocytes Absolute: 0.6 10*3/uL (ref 0.1–1.0)
Monocytes Relative: 6 %
Neutro Abs: 8.2 10*3/uL — ABNORMAL HIGH (ref 1.7–7.7)
Neutrophils Relative %: 78 %
Platelets: 259 10*3/uL (ref 150–400)
RBC: 4.25 MIL/uL (ref 4.22–5.81)
RDW: 18.3 % — ABNORMAL HIGH (ref 11.5–15.5)
WBC: 10.4 10*3/uL (ref 4.0–10.5)
nRBC: 0.3 % — ABNORMAL HIGH (ref 0.0–0.2)

## 2021-07-29 LAB — FERRITIN: Ferritin: 86 ng/mL (ref 24–336)

## 2021-07-29 LAB — FOLATE: Folate: 25 ng/mL (ref >5.9)

## 2021-07-29 MED ORDER — DEXAMETHASONE SODIUM PHOSPHATE 10 MG/ML IJ SOLN
4.0000 mg | Freq: Once | INTRAMUSCULAR | Status: AC
  Administered 2021-07-29: 4 mg via INTRAVENOUS
  Filled 2021-07-29: qty 1

## 2021-07-29 MED ORDER — DIPHENHYDRAMINE HCL 50 MG/ML IJ SOLN
50.0000 mg | Freq: Once | INTRAMUSCULAR | Status: AC
  Administered 2021-07-29: 50 mg via INTRAVENOUS
  Filled 2021-07-29: qty 1

## 2021-07-29 MED ORDER — SODIUM CHLORIDE 0.9 % IV SOLN
Freq: Once | INTRAVENOUS | Status: AC
  Filled 2021-07-29: qty 250

## 2021-07-29 MED ORDER — SODIUM CHLORIDE 0.9 % IV SOLN
4.0000 mg | Freq: Once | INTRAVENOUS | Status: DC
  Filled 2021-07-29: qty 0.4

## 2021-07-29 MED ORDER — PEGFILGRASTIM 6 MG/0.6ML ~~LOC~~ PSKT
6.0000 mg | PREFILLED_SYRINGE | Freq: Once | SUBCUTANEOUS | Status: AC
  Administered 2021-07-29: 6 mg via SUBCUTANEOUS
  Filled 2021-07-29: qty 0.6

## 2021-07-29 MED ORDER — SODIUM CHLORIDE 0.9 % IV SOLN
375.0000 mg/m2 | Freq: Once | INTRAVENOUS | Status: AC
  Administered 2021-07-29: 890 mg via INTRAVENOUS
  Filled 2021-07-29: qty 89

## 2021-07-29 MED ORDER — SODIUM CHLORIDE 0.9 % IV SOLN
150.0000 mg | Freq: Once | INTRAVENOUS | Status: AC
  Administered 2021-07-29: 150 mg via INTRAVENOUS
  Filled 2021-07-29: qty 150

## 2021-07-29 MED ORDER — ACETAMINOPHEN 325 MG PO TABS
650.0000 mg | ORAL_TABLET | Freq: Once | ORAL | Status: AC
  Administered 2021-07-29: 650 mg via ORAL
  Filled 2021-07-29: qty 2

## 2021-07-29 MED ORDER — VINBLASTINE SULFATE CHEMO INJECTION 1 MG/ML
6.0000 mg/m2 | Freq: Once | INTRAVENOUS | Status: AC
  Administered 2021-07-29: 14.2 mg via INTRAVENOUS
  Filled 2021-07-29: qty 14.2

## 2021-07-29 MED ORDER — PROCHLORPERAZINE EDISYLATE 10 MG/2ML IJ SOLN
10.0000 mg | Freq: Once | INTRAMUSCULAR | Status: AC
  Administered 2021-07-29: 10 mg via INTRAVENOUS

## 2021-07-29 MED ORDER — SODIUM CHLORIDE 0.9 % IV SOLN
120.0000 mg | Freq: Once | INTRAVENOUS | Status: AC
  Administered 2021-07-29: 120 mg via INTRAVENOUS
  Filled 2021-07-29: qty 24

## 2021-07-29 MED ORDER — DOXORUBICIN HCL CHEMO IV INJECTION 2 MG/ML
25.0000 mg/m2 | Freq: Once | INTRAVENOUS | Status: AC
  Administered 2021-07-29: 60 mg via INTRAVENOUS
  Filled 2021-07-29: qty 30

## 2021-07-29 MED ORDER — PALONOSETRON HCL INJECTION 0.25 MG/5ML
0.2500 mg | Freq: Once | INTRAVENOUS | Status: AC
  Administered 2021-07-29: 0.25 mg via INTRAVENOUS
  Filled 2021-07-29: qty 5

## 2021-07-29 MED ORDER — HEPARIN SOD (PORK) LOCK FLUSH 100 UNIT/ML IV SOLN
500.0000 [IU] | Freq: Once | INTRAVENOUS | Status: AC | PRN
  Administered 2021-07-29: 500 [IU]
  Filled 2021-07-29: qty 5

## 2021-07-29 NOTE — Progress Notes (Signed)
Hematology/Oncology Consult note [facility]  [phone] [phone]  Patient Care Team: [place] Pink, MD as PCP - General (Family Medicine) Sindy Guadeloupe, MD as Consulting Physician (Oncology)   Name of the patient: Erik Buchanan  [id number]  09-29-1960   Date of visit: 07/29/21  Diagnosis- stage III classical Hodgkin's lymphoma  Chief complaint/ Reason for visit-on treatment assessment prior to cycle 3-day 1 of BV AVD chemotherapy  Heme/Onc history: patient is a 61 year old male with a past medical history significant for hypertension and diabetes among other medical problems.  He was noted to have left-sided neck swelling which has been ongoing for the last 4 months but he feels that the swelling has slowly increased in size.  He was seen by Dr. Kary Kos and underwent CT chest and CT soft tissue neck after initial ultrasound of the neck.  CT showed bulky supraclavicular adenopathy with the largest node measuring 4.1 cm CT chest with contrast also showed similar findings and no evidence of right supraclavicular axillary mediastinal or hilar adenopathy.  Lungs were clear.  Multiple hypodense lesions in the spleen which nearly replaces the splenic parenchyma.  Spleen however does not appear enlarged.    PET CT scan showed FDG avid adenopathy within the lower left neck to left supraclavicular region as well as small bowel mesentery peritoneum and porta hepatic lesions.  Multiple FDG avid splenic lesions.  Mild diffuse increased uptake in the bone marrow nonspecific.  No focal areas of increased uptake to suggest bone metastases.   Excisional supraclavicular lymph node biopsy showed classical Hodgkin's lymphoma. The H&E stained sections reveal lymph node effaced by a somewhat nodular proliferation of large, abnormal lymphoid cells, including "mummified" cells, "lacunar" cells, and occasional binucleated cells with classic Reed-Sternberg  morphology, in a background of predominantly small lymphocytes.  EBV negative   Stage III classical Hodgkin's lymphoma IPI score 2.5-year freedom from progression of 80% and 91% overall survival.  Interval history-patient reports pain in his left forearm which lasts for a day or 2 after chemotherapy.  He also has fatigue which last for about 2 to 3 days post chemo.  Otherwise he is doing well.  Blood sugars continue to remain high.  Patient's fasting blood sugar on the day of his PET scan yesterday was 242 and today it is in the 300s.  Denies any significant tingling numbness in his extremities  ECOG PS- 1 Pain scale- 0   Review of systems- Review of Systems  Constitutional:  Positive for malaise/fatigue. Negative for chills, fever and weight loss.  HENT:  Negative for congestion, ear discharge and nosebleeds.   Eyes:  Negative for blurred vision.  Respiratory:  Negative for cough, hemoptysis, sputum production, shortness of breath and wheezing.   Cardiovascular:  Negative for chest pain, palpitations, orthopnea and claudication.  Gastrointestinal:  Negative for abdominal pain, blood in stool, constipation, diarrhea, heartburn, melena, nausea and vomiting.  Genitourinary:  Negative for dysuria, flank pain, frequency, hematuria and urgency.  Musculoskeletal:  Negative for back pain, joint pain and myalgias.  Skin:  Negative for rash.  Neurological:  Negative for dizziness, tingling, focal weakness, seizures, weakness and headaches.  Endo/Heme/Allergies:  Does not bruise/bleed easily.  Psychiatric/Behavioral:  Negative for depression and suicidal ideas. The patient does not have insomnia.      Allergies  Allergen Reactions   Ultram [Tramadol Hcl] Other (See Comments)    vertigo and passed out     Past Medical History:  Diagnosis Date  Anemia    Colon polyps    Depression    Diabetes mellitus without complication (HCC)    GERD (gastroesophageal reflux disease)    Hemorrhoids     Hyperlipidemia    Hypertension    Pneumonia    Testicular hypofunction    Tinnitus      Past Surgical History:  Procedure Laterality Date   COLONOSCOPY WITH PROPOFOL N/A 04/07/2015   Procedure: COLONOSCOPY WITH PROPOFOL;  Surgeon: Hulen Luster, MD;  Location: [facility] ENDOSCOPY;  Service: Gastroenterology;  Laterality: N/A;   COLONOSCOPY WITH PROPOFOL N/A 09/01/2020   Procedure: COLONOSCOPY WITH PROPOFOL;  Surgeon: Toledo, Benay Pike, MD;  Location: [facility] ENDOSCOPY;  Service: Gastroenterology;  Laterality: N/A;   EYE SURGERY     cataract   MASS BIOPSY Left 05/07/2021   Procedure: NECK MASS BIOPSY,;  Surgeon: Jules Husbands, MD;  Location: [facility] ORS;  Service: General;  Laterality: Left;  Provider requesting 1.5 hours / 90 minutes for procedure.   PORTACATH PLACEMENT Right 05/21/2021   Procedure: INSERTION PORT-A-CATH;  Surgeon: Jules Husbands, MD;  Location: [facility] ORS;  Service: General;  Laterality: Right;    Social History   Socioeconomic History   Marital status: Married    Spouse name: [name]   Number of children: Not on file   Years of education: Not on file   Highest education level: Not on file  Occupational History   Not on file  Tobacco Use   Smoking status: Never   Smokeless tobacco: Never  Vaping Use   Vaping Use: Never used  Substance and Sexual Activity   Alcohol use: Never   Drug use: Never   Sexual activity: Yes  Other Topics Concern   Not on file  Social History Narrative   Not on file   Social Determinants of Health   Financial Resource Strain: Not on file  Food Insecurity: Not on file  Transportation Needs: Not on file  Physical Activity: Not on file  Stress: Not on file  Social Connections: Not on file  Intimate Partner Violence: Not on file    Family History  Problem Relation Age of Onset   Arthritis Mother    Stroke Paternal Aunt      Current Outpatient Medications:    acidophilus (RISAQUAD) CAPS capsule, Take 1 capsule by mouth every evening.,  Disp: , Rfl:    amLODipine (NORVASC) 5 MG tablet, Take 7.5 mg by mouth daily., Disp: , Rfl:    buPROPion (WELLBUTRIN XL) 150 MG 24 hr tablet, Take 450 mg by mouth daily., Disp: , Rfl:    buPROPion (WELLBUTRIN XL) 150 MG 24 hr tablet, Take 3 tablets by mouth daily., Disp: , Rfl:    dexamethasone (DECADRON) 4 MG tablet, Take 2 tablets by mouth once a day for 3 days after chemo. Take with food., Disp: 30 tablet, Rfl: 1   glipiZIDE (GLUCOTROL XL) 5 MG 24 hr tablet, Take 5 mg by mouth daily., Disp: , Rfl:    glipiZIDE (GLUCOTROL XL) 5 MG 24 hr tablet, Take 1 tablet by mouth 2 (two) times daily., Disp: , Rfl:    Homeopathic Products (LEG CRAMPS) TABS, Take by mouth as needed. Pt taking for leg cramps., Disp: , Rfl:    ibuprofen (ADVIL,MOTRIN) 200 MG tablet, Take 200-400 mg by mouth every 6 (six) hours as needed for moderate pain., Disp: , Rfl:    lidocaine-prilocaine (EMLA) cream, Apply to affected area once, Disp: 30 g, Rfl: 3   lisinopril (PRINIVIL,ZESTRIL) 10 MG  tablet, Take 10 mg by mouth daily., Disp: , Rfl:    loratadine (CLARITIN) 10 MG tablet, Take 10 mg by mouth daily as needed for allergies., Disp: , Rfl:    LORazepam (ATIVAN) 0.5 MG tablet, Take 1 tablet (0.5 mg total) by mouth every 6 (six) hours as needed (Nausea or vomiting)., Disp: 30 tablet, Rfl: 0   metFORMIN (GLUCOPHAGE) 500 MG tablet, Take 2 tablets by mouth 2 (two) times daily with a meal., Disp: , Rfl:    metFORMIN (GLUCOPHAGE-XR) 500 MG 24 hr tablet, Take 1,000 mg by mouth in the morning and at bedtime., Disp: , Rfl:    Multiple Vitamin (MULTIVITAMIN PO), Take 1 Package by mouth daily., Disp: , Rfl:    ondansetron (ZOFRAN) 4 MG tablet, Take 4 mg by mouth every 6 (six) hours., Disp: , Rfl:    ondansetron (ZOFRAN) 8 MG tablet, Take 1 tablet (8 mg total) by mouth 2 (two) times daily as needed. Start on the third day after chemotherapy., Disp: 30 tablet, Rfl: 1   oxyCODONE (OXY IR/ROXICODONE) 5 MG immediate release tablet, Take 1 tablet  (5 mg total) by mouth every 4 (four) hours as needed for severe pain., Disp: 30 tablet, Rfl: 0   pantoprazole (PROTONIX) 20 MG tablet, Take 1 tablet (20 mg total) by mouth daily., Disp: 30 tablet, Rfl: 1   simvastatin (ZOCOR) 20 MG tablet, Take 20 mg by mouth every evening., Disp: , Rfl:    sodium chloride (OCEAN) 0.65 % SOLN nasal spray, Place 1 spray into both nostrils as needed for congestion., Disp: , Rfl:    sulfamethoxazole-trimethoprim (BACTRIM DS) 800-160 MG tablet, Take 1 tablet by mouth 2 (two) times daily., Disp: 14 tablet, Rfl: 0   Testosterone 1.62 % GEL, Apply 2 Pump topically in the morning. Applied to shoulders/upper arms, Disp: , Rfl:    tetrahydrozoline-zinc (VISINE-AC) 0.05-0.25 % ophthalmic solution, Place 2 drops into both eyes 3 (three) times daily as needed (dry eyes)., Disp: , Rfl:    Triamcinolone Acetonide (NASACORT AQ NA), Place 2 sprays into both nostrils daily as needed (allergies)., Disp: , Rfl:  No current facility-administered medications for this visit.  Facility-Administered Medications Ordered in Other Visits:    0.9 %  sodium chloride infusion, , Intravenous, Once, Sindy Guadeloupe, MD   acetaminophen (TYLENOL) tablet 650 mg, 650 mg, Oral, Once, Sindy Guadeloupe, MD   brentuximab vedotin (ADCETRIS) 120 mg in sodium chloride 0.9 % 100 mL chemo infusion, 120 mg, Intravenous, Once, Sindy Guadeloupe, MD   dacarbazine (DTIC) 890 mg in sodium chloride 0.9 % 250 mL chemo infusion, 375 mg/m2 (Treatment Plan Recorded), Intravenous, Once, Sindy Guadeloupe, MD   dexamethasone (DECADRON) 4 mg in sodium chloride 0.9 % 50 mL IVPB, 4 mg, Intravenous, Once, Sindy Guadeloupe, MD   diphenhydrAMINE (BENADRYL) injection 50 mg, 50 mg, Intravenous, Once, Sindy Guadeloupe, MD   DOXOrubicin (ADRIAMYCIN) chemo injection 60 mg, 25 mg/m2 (Treatment Plan Recorded), Intravenous, Once, Sindy Guadeloupe, MD   fosaprepitant (EMEND) 150 mg in sodium chloride 0.9 % 145 mL IVPB, 150 mg, Intravenous, Once, Sindy Guadeloupe, MD   palonosetron (ALOXI) injection 0.25 mg, 0.25 mg, Intravenous, Once, Sindy Guadeloupe, MD   vinBLAStine (VELBAN) 14.2 mg in sodium chloride 0.9 % 50 mL chemo infusion, 6 mg/m2 (Treatment Plan Recorded), Intravenous, Once, Sindy Guadeloupe, MD  Physical exam:  Vitals:   07/29/21 0826  BP: 133/77  Pulse: 92  Resp: 16  Temp: 98.2 F (  36.8 C)  TempSrc: Tympanic  SpO2: 97%  Weight: 237 lb 8 oz (107.7 kg)  Height: 5\' 11"  (1.803 m)   Physical Exam Constitutional:      General: He is not in acute distress. Cardiovascular:     Rate and Rhythm: Normal rate and regular rhythm.     Heart sounds: Normal heart sounds.  Pulmonary:     Effort: Pulmonary effort is normal.     Breath sounds: Normal breath sounds.  Abdominal:     General: Bowel sounds are normal.     Palpations: Abdomen is soft.  Skin:    General: Skin is warm and dry.  Neurological:     Mental Status: He is alert and oriented to person, place, and time.     CMP Latest Ref Rng & Units 07/29/2021  Glucose 70 - 99 mg/dL 381(H)  BUN 6 - 20 mg/dL 21(H)  Creatinine 0.61 - 1.24 mg/dL 0.87  Sodium 135 - 145 mmol/L 133(L)  Potassium 3.5 - 5.1 mmol/L 4.1  Chloride 98 - 111 mmol/L 99  CO2 22 - 32 mmol/L 22  Calcium 8.9 - 10.3 mg/dL 9.2  Total Protein 6.5 - 8.1 g/dL 6.9  Total Bilirubin 0.3 - 1.2 mg/dL 0.4  Alkaline Phos 38 - 126 U/L 98  AST 15 - 41 U/L 20  ALT 0 - 44 U/L 29   CBC Latest Ref Rng & Units 07/29/2021  WBC 4.0 - 10.5 K/uL 10.4  Hemoglobin 13.0 - 17.0 g/dL 11.6(L)  Hematocrit 39.0 - 52.0 % 34.8(L)  Platelets 150 - 400 K/uL 259    No images are attached to the encounter.  NM PET Image Restag (PS) Skull Base To Thigh  Result Date: 07/29/2021 CLINICAL DATA:  Subsequent treatment strategy for Hodgkin's lymphoma. EXAM: NUCLEAR MEDICINE PET SKULL BASE TO THIGH TECHNIQUE: 13.6 mCi F-18 FDG was injected intravenously. Full-ring PET imaging was performed from the skull base to thigh after the radiotracer.  CT data was obtained and used for attenuation correction and anatomic localization. Fasting blood glucose: 242 mg/dl COMPARISON:  Multiple exams, including 05/06/2021 FINDINGS: Mediastinal blood pool activity: SUV max 2.4 Liver activity: SUV max 3.3 NECK: Interval excision of most of the left level 4 adenopathy. A residual lymph node in this vicinity measures 0.8 cm in short axis on image 66 series 3 (formerly 2.1 cm) with maximum SUV 1.3 (Deauville 2), previously 9.8. No new adenopathy in the neck. Incidental CT findings: Chronic left maxillary sinusitis. CHEST: Left upper mediastinal prevascular node measures 0.3 cm in short axis on image 76 series 3, maximum SUV 0.9 (Deauville 2). This previously measured 0.9 cm in short axis with maximum SUV of 3.7. No new adenopathy in the chest. Incidental CT findings: Right Port-A-Cath tip: Cavoatrial junction. Mild cardiomegaly. ABDOMEN/PELVIS: Previous splenic lesions have resolved (Deauville 1). Substantial reduction in size and activity of previous porta hepatis, retroperitoneal, and mesenteric adenopathy. One of the largest remaining mesenteric lymph nodes measures 1.5 cm in short axis on image 180 of series 3 with a maximum SUV of 1.8, Deauville 3. This previously measured 2.7 cm in short axis with a maximum SUV of 14.6. Index portacaval node 0.9 cm in short axis on image 163 series 3 with maximum SUV 2.3 (Deauville 2). This previously measured 1.8 cm in short axis with a maximum SUV of 7.3. No substantial new adenopathy is identified. Incidental CT findings: Diffuse hepatic steatosis. Hypodense hepatic lesions favoring cysts. SKELETON: Mild diffusely accentuated skeletal activity without substantial focal lesion. This  may be caused by granulocyte stimulation. Incidental CT findings: Incidental pseudoarticulation of the left second rib with the left first rib anteriorly. Bridging spurring of the sacroiliac joints. IMPRESSION: 1. Marked improvement, with resolution of  prior splenic lesions and marked reduction in size and activity of adenopathy in the lower neck, chest, and abdomen. Some of the prior lymph nodes have completely resolved (Deauville 1), others are substantially reduced in size and demonstrate Deauville 2 activity currently. 2. Mildly diffuse accentuated skeletal activity without substantial focal lesion, possibly from granulocyte stimulation. 3. Other imaging findings of potential clinical significance: Chronic left maxillary sinusitis. Mild cardiomegaly. Diffuse hepatic steatosis with suspected hepatic cysts. Electronically Signed   By: Van Clines M.D.   On: 07/29/2021 08:03     Assessment and plan- Patient is a 60 y.o. male with stage III classical Hodgkin's lymphoma.  He is here for on treatment assessment prior to cycle 3-day 1 of BV AVD chemotherapy and discuss PET CT scan results and further management  I have reviewed PET/CT scan images independently and discussed findings with the patient.  Overall after 2 cycles of BV AVD chemotherapy patient has had an excellent response to treatment as evidenced by reduction in the size of hisIntrathoracic as well as intra-abdominal adenopathy.  Resolution of splenic lesions.  Deauville score is now 1-2.  Plan is to complete 6 cycles of BV AVD chemotherapy.  I will see him back in 2 weeks for cycle 3-day 1 of BV AVD chemotherapy.  So far he does not have any significant peripheral neuropathy.  Continue to monitor  Hyperglycemia: I have reduced the dose of Decadron his premeds to 4 mg.  He would also not take any Decadron for the next 3 days.  Regardless his blood sugar is 381 today.  He will be reaching out to his primary care Dr. Chrystine Oiler to adjust his diabetic medications.  He is currently on maximum doses of metformin 1000 mg twice daily as well as glipizide.  Concern is he may need insulin at least during his chemotherapy   Visit Diagnosis 1. Encounter for antineoplastic chemotherapy   2.  Encounter for monoclonal antibody treatment for malignancy   3. 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 Hyperglycemia      Dr. Randa Evens, MD, MPH [facility] [phone] 07/29/2021 8:53 AM

## 2021-07-29 NOTE — Progress Notes (Signed)
Towards the end of the Dacarbazine infusion, patient started complaining of nausea, which progressively got worse. MD notified and gave verbal order for antiemetic. Compazine 10 mg IV given and nausea improved. Treatment completed without difficulty.

## 2021-07-30 ENCOUNTER — Other Ambulatory Visit: Payer: Self-pay | Admitting: Oncology

## 2021-07-31 ENCOUNTER — Ambulatory Visit: Payer: BC Managed Care – PPO

## 2021-07-31 ENCOUNTER — Encounter: Payer: Self-pay | Admitting: Oncology

## 2021-08-12 ENCOUNTER — Encounter: Payer: Self-pay | Admitting: Oncology

## 2021-08-12 ENCOUNTER — Inpatient Hospital Stay: Payer: BC Managed Care – PPO

## 2021-08-12 ENCOUNTER — Inpatient Hospital Stay: Payer: BC Managed Care – PPO | Attending: Oncology

## 2021-08-12 ENCOUNTER — Other Ambulatory Visit: Payer: Self-pay | Admitting: *Deleted

## 2021-08-12 ENCOUNTER — Inpatient Hospital Stay (HOSPITAL_BASED_OUTPATIENT_CLINIC_OR_DEPARTMENT_OTHER): Payer: BC Managed Care – PPO | Admitting: Oncology

## 2021-08-12 ENCOUNTER — Other Ambulatory Visit: Payer: Self-pay

## 2021-08-12 VITALS — BP 140/85 | HR 87 | Temp 98.3°F | Resp 16 | Ht 71.0 in | Wt 236.7 lb

## 2021-08-12 DIAGNOSIS — M898X9 Other specified disorders of bone, unspecified site: Secondary | ICD-10-CM | POA: Insufficient documentation

## 2021-08-12 DIAGNOSIS — Z79899 Other long term (current) drug therapy: Secondary | ICD-10-CM | POA: Insufficient documentation

## 2021-08-12 DIAGNOSIS — C8190 Hodgkin lymphoma, unspecified, unspecified site: Secondary | ICD-10-CM | POA: Insufficient documentation

## 2021-08-12 DIAGNOSIS — Z7952 Long term (current) use of systemic steroids: Secondary | ICD-10-CM | POA: Diagnosis not present

## 2021-08-12 DIAGNOSIS — Z7984 Long term (current) use of oral hypoglycemic drugs: Secondary | ICD-10-CM | POA: Insufficient documentation

## 2021-08-12 DIAGNOSIS — E1165 Type 2 diabetes mellitus with hyperglycemia: Secondary | ICD-10-CM | POA: Diagnosis not present

## 2021-08-12 DIAGNOSIS — Z5111 Encounter for antineoplastic chemotherapy: Secondary | ICD-10-CM

## 2021-08-12 DIAGNOSIS — Z5189 Encounter for other specified aftercare: Secondary | ICD-10-CM | POA: Insufficient documentation

## 2021-08-12 DIAGNOSIS — C8108 Nodular lymphocyte predominant Hodgkin lymphoma, lymph nodes of multiple sites: Secondary | ICD-10-CM | POA: Diagnosis not present

## 2021-08-12 DIAGNOSIS — C8178 Other classical Hodgkin lymphoma, lymph nodes of multiple sites: Secondary | ICD-10-CM

## 2021-08-12 LAB — CBC WITH DIFFERENTIAL/PLATELET
Abs Immature Granulocytes: 0.26 10*3/uL — ABNORMAL HIGH (ref 0.00–0.07)
Basophils Absolute: 0.1 10*3/uL (ref 0.0–0.1)
Basophils Relative: 1 %
Eosinophils Absolute: 0.1 10*3/uL (ref 0.0–0.5)
Eosinophils Relative: 1 %
HCT: 32.9 % — ABNORMAL LOW (ref 39.0–52.0)
Hemoglobin: 11 g/dL — ABNORMAL LOW (ref 13.0–17.0)
Immature Granulocytes: 3 %
Lymphocytes Relative: 12 %
Lymphs Abs: 1.2 10*3/uL (ref 0.7–4.0)
MCH: 27.8 pg (ref 26.0–34.0)
MCHC: 33.4 g/dL (ref 30.0–36.0)
MCV: 83.3 fL (ref 80.0–100.0)
Monocytes Absolute: 0.6 10*3/uL (ref 0.1–1.0)
Monocytes Relative: 6 %
Neutro Abs: 7.9 10*3/uL — ABNORMAL HIGH (ref 1.7–7.7)
Neutrophils Relative %: 77 %
Platelets: 218 10*3/uL (ref 150–400)
RBC: 3.95 MIL/uL — ABNORMAL LOW (ref 4.22–5.81)
RDW: 18.6 % — ABNORMAL HIGH (ref 11.5–15.5)
WBC: 10.2 10*3/uL (ref 4.0–10.5)
nRBC: 0 % (ref 0.0–0.2)

## 2021-08-12 LAB — COMPREHENSIVE METABOLIC PANEL
ALT: 31 U/L (ref 0–44)
AST: 19 U/L (ref 15–41)
Albumin: 4.2 g/dL (ref 3.5–5.0)
Alkaline Phosphatase: 88 U/L (ref 38–126)
Anion gap: 11 (ref 5–15)
BUN: 19 mg/dL (ref 6–20)
CO2: 23 mmol/L (ref 22–32)
Calcium: 9 mg/dL (ref 8.9–10.3)
Chloride: 99 mmol/L (ref 98–111)
Creatinine, Ser: 0.86 mg/dL (ref 0.61–1.24)
GFR, Estimated: >60 mL/min (ref >60)
Glucose, Bld: 349 mg/dL — ABNORMAL HIGH (ref 70–99)
Potassium: 3.9 mmol/L (ref 3.5–5.1)
Sodium: 133 mmol/L — ABNORMAL LOW (ref 135–145)
Total Bilirubin: 0.5 mg/dL (ref 0.3–1.2)
Total Protein: 6.8 g/dL (ref 6.5–8.1)

## 2021-08-12 MED ORDER — DOXORUBICIN HCL CHEMO IV INJECTION 2 MG/ML
25.0000 mg/m2 | Freq: Once | INTRAVENOUS | Status: AC
  Administered 2021-08-12: 60 mg via INTRAVENOUS
  Filled 2021-08-12: qty 30

## 2021-08-12 MED ORDER — ACETAMINOPHEN 325 MG PO TABS
650.0000 mg | ORAL_TABLET | Freq: Once | ORAL | Status: AC
  Administered 2021-08-12: 650 mg via ORAL
  Filled 2021-08-12: qty 2

## 2021-08-12 MED ORDER — SODIUM CHLORIDE 0.9 % IV SOLN
150.0000 mg | Freq: Once | INTRAVENOUS | Status: AC
  Administered 2021-08-12: 150 mg via INTRAVENOUS
  Filled 2021-08-12: qty 150

## 2021-08-12 MED ORDER — SODIUM CHLORIDE 0.9 % IV SOLN
375.0000 mg/m2 | Freq: Once | INTRAVENOUS | Status: AC
  Administered 2021-08-12: 890 mg via INTRAVENOUS
  Filled 2021-08-12: qty 89

## 2021-08-12 MED ORDER — PEGFILGRASTIM 6 MG/0.6ML ~~LOC~~ PSKT
6.0000 mg | PREFILLED_SYRINGE | Freq: Once | SUBCUTANEOUS | Status: AC
  Administered 2021-08-12: 6 mg via SUBCUTANEOUS
  Filled 2021-08-12: qty 0.6

## 2021-08-12 MED ORDER — DIPHENHYDRAMINE HCL 50 MG/ML IJ SOLN
50.0000 mg | Freq: Once | INTRAMUSCULAR | Status: AC
  Administered 2021-08-12: 50 mg via INTRAVENOUS
  Filled 2021-08-12: qty 1

## 2021-08-12 MED ORDER — HEPARIN SOD (PORK) LOCK FLUSH 100 UNIT/ML IV SOLN
500.0000 [IU] | Freq: Once | INTRAVENOUS | Status: AC | PRN
  Administered 2021-08-12: 500 [IU]
  Filled 2021-08-12: qty 5

## 2021-08-12 MED ORDER — SODIUM CHLORIDE 0.9 % IV SOLN
120.0000 mg | Freq: Once | INTRAVENOUS | Status: AC
  Administered 2021-08-12: 120 mg via INTRAVENOUS
  Filled 2021-08-12: qty 24

## 2021-08-12 MED ORDER — SODIUM CHLORIDE 0.9 % IV SOLN: 4.0000 mg | Freq: Once | INTRAVENOUS | Status: DC

## 2021-08-12 MED ORDER — DEXAMETHASONE SODIUM PHOSPHATE 10 MG/ML IJ SOLN
4.0000 mg | Freq: Once | INTRAMUSCULAR | Status: AC
  Administered 2021-08-12: 4 mg via INTRAVENOUS
  Filled 2021-08-12: qty 1

## 2021-08-12 MED ORDER — OXYCODONE HCL 5 MG PO TABS: 5.0000 mg | ORAL_TABLET | ORAL | 0 refills | Status: DC | PRN

## 2021-08-12 MED ORDER — PALONOSETRON HCL INJECTION 0.25 MG/5ML
0.2500 mg | Freq: Once | INTRAVENOUS | Status: AC
  Administered 2021-08-12: 0.25 mg via INTRAVENOUS
  Filled 2021-08-12: qty 5

## 2021-08-12 MED ORDER — PROCHLORPERAZINE EDISYLATE 10 MG/2ML IJ SOLN
10.0000 mg | Freq: Once | INTRAMUSCULAR | Status: AC
  Administered 2021-08-12: 10 mg via INTRAVENOUS
  Filled 2021-08-12: qty 2

## 2021-08-12 MED ORDER — SODIUM CHLORIDE 0.9 % IV SOLN
Freq: Once | INTRAVENOUS | Status: AC
  Filled 2021-08-12: qty 250

## 2021-08-12 MED ORDER — VINBLASTINE SULFATE CHEMO INJECTION 1 MG/ML
6.0000 mg/m2 | Freq: Once | INTRAVENOUS | Status: AC
  Administered 2021-08-12: 14.2 mg via INTRAVENOUS
  Filled 2021-08-12: qty 14.2

## 2021-08-12 NOTE — Patient Instructions (Addendum)
Dacarbazine, DTIC injection What is this medication? DACARBAZINE (da KAR ba zeen) is a chemotherapy drug. This medicine is used to treat skin cancer. It is also used with other medicines to treat Hodgkin's disease. This medicine may be used for other purposes; ask your health care provider or pharmacist if you have questions. COMMON BRAND NAME(S): DTIC-Dome What should I tell my care team before I take this medication? They need to know if you have any of these conditions: infection (especially virus infection such as chickenpox, cold sores, or herpes) kidney disease liver disease low blood counts like low platelets, red blood cells, white blood cells recent radiation therapy an unusual or allergic reaction to dacarbazine, other chemotherapy agents, other medicines, foods, dyes, or preservatives pregnant or trying to get pregnant breast-feeding How should I use this medication? This drug is given as an injection or infusion into a vein. It is administered in a hospital or clinic by a specially trained health care professional. Talk to your pediatrician regarding the use of this medicine in children. While this drug may be prescribed for selected conditions, precautions do apply. Overdosage: If you think you have taken too much of this medicine contact a poison control center or emergency room at once. NOTE: This medicine is only for you. Do not share this medicine with others. What if I miss a dose? It is important not to miss your dose. Call your doctor or health care professional if you are unable to keep an appointment. What may interact with this medication? medicines to increase blood counts like filgrastim, pegfilgrastim, sargramostim vaccines This list may not describe all possible interactions. Give your health care provider a list of all the medicines, herbs, non-prescription drugs, or dietary supplements you use. Also tell them if you smoke, drink alcohol, or use illegal drugs.  Some items may interact with your medicine. What should I watch for while using this medication? Your condition will be monitored carefully while you are receiving this medicine. You will need important blood work done while you are taking this medicine. This drug may make you feel generally unwell. This is not uncommon, as chemotherapy can affect healthy cells as well as cancer cells. Report any side effects. Continue your course of treatment even though you feel ill unless your doctor tells you to stop. Call your doctor or health care professional for advice if you get a fever, chills or sore throat, or other symptoms of a cold or flu. Do not treat yourself. This drug decreases your body's ability to fight infections. Try to avoid being around people who are sick. This medicine may increase your risk to bruise or bleed. Call your doctor or health care professional if you notice any unusual bleeding. Talk to your doctor about your risk of cancer. You may be more at risk for certain types of cancers if you take this medicine. Do not become pregnant while taking this medicine. Women should inform their doctor if they wish to become pregnant or think they might be pregnant. There is a potential for serious side effects to an unborn child. Talk to your health care professional or pharmacist for more information. Do not breast-feed an infant while taking this medicine. What side effects may I notice from receiving this medication? Side effects that you should report to your doctor or health care professional as soon as possible: allergic reactions like skin rash, itching or hives, swelling of the face, lips, or tongue low blood counts - this medicine may decrease  the number of white blood cells, red blood cells and platelets. You may be at increased risk for infections and bleeding. signs of infection - fever or chills, cough, sore throat, pain or difficulty passing urine signs of decreased platelets or  bleeding - bruising, pinpoint red spots on the skin, black, tarry stools, blood in the urine signs of decreased red blood cells - unusually weak or tired, fainting spells, lightheadedness breathing problems muscle pains pain at site where injected trouble passing urine or change in the amount of urine vomiting yellowing of the eyes or skin Side effects that usually do not require medical attention (report to your doctor or health care professional if they continue or are bothersome): diarrhea hair loss loss of appetite nausea skin more sensitive to sun or ultraviolet light stomach upset This list may not describe all possible side effects. Call your doctor for medical advice about side effects. You may report side effects to FDA at 1-800-FDA-1088. Where should I keep my medication? This drug is given in a hospital or clinic and will not be stored at home. NOTE: This sheet is a summary. It may not cover all possible information. If you have questions about this medicine, talk to your doctor, pharmacist, or health care provider.  2022 Elsevier/Gold Standard (2016-01-14 00:00:00) Doxorubicin injection What is this medication? DOXORUBICIN (dox oh ROO bi sin) is a chemotherapy drug. It is used to treat many kinds of cancer like leukemia, lymphoma, neuroblastoma, sarcoma, and Wilms' tumor. It is also used to treat bladder cancer, breast cancer, lung cancer, ovarian cancer, stomach cancer, and thyroid cancer. This medicine may be used for other purposes; ask your health care provider or pharmacist if you have questions. COMMON BRAND NAME(S): Adriamycin, Adriamycin PFS, Adriamycin RDF, Rubex What should I tell my care team before I take this medication? They need to know if you have any of these conditions: heart disease history of low blood counts caused by a medicine liver disease recent or ongoing radiation therapy an unusual or allergic reaction to doxorubicin, other chemotherapy agents,  other medicines, foods, dyes, or preservatives pregnant or trying to get pregnant breast-feeding How should I use this medication? This drug is given as an infusion into a vein. It is administered in a hospital or clinic by a specially trained health care professional. If you have pain, swelling, burning or any unusual feeling around the site of your injection, tell your health care professional right away. Talk to your pediatrician regarding the use of this medicine in children. Special care may be needed. Overdosage: If you think you have taken too much of this medicine contact a poison control center or emergency room at once. NOTE: This medicine is only for you. Do not share this medicine with others. What if I miss a dose? It is important not to miss your dose. Call your doctor or health care professional if you are unable to keep an appointment. What may interact with this medication? This medicine may interact with the following medications: 6-mercaptopurine paclitaxel phenytoin St. John's Wort trastuzumab verapamil This list may not describe all possible interactions. Give your health care provider a list of all the medicines, herbs, non-prescription drugs, or dietary supplements you use. Also tell them if you smoke, drink alcohol, or use illegal drugs. Some items may interact with your medicine. What should I watch for while using this medication? This drug may make you feel generally unwell. This is not uncommon, as chemotherapy can affect healthy cells as well  as cancer cells. Report any side effects. Continue your course of treatment even though you feel ill unless your doctor tells you to stop. There is a maximum amount of this medicine you should receive throughout your life. The amount depends on the medical condition being treated and your overall health. Your doctor will watch how much of this medicine you receive in your lifetime. Tell your doctor if you have taken this  medicine before. You may need blood work done while you are taking this medicine. Your urine may turn red for a few days after your dose. This is not blood. If your urine is dark or brown, call your doctor. In some cases, you may be given additional medicines to help with side effects. Follow all directions for their use. Call your doctor or health care professional for advice if you get a fever, chills or sore throat, or other symptoms of a cold or flu. Do not treat yourself. This drug decreases your body's ability to fight infections. Try to avoid being around people who are sick. This medicine may increase your risk to bruise or bleed. Call your doctor or health care professional if you notice any unusual bleeding. Talk to your doctor about your risk of cancer. You may be more at risk for certain types of cancers if you take this medicine. Do not become pregnant while taking this medicine or for 6 months after stopping it. Women should inform their doctor if they wish to become pregnant or think they might be pregnant. Men should not father a child while taking this medicine and for 6 months after stopping it. There is a potential for serious side effects to an unborn child. Talk to your health care professional or pharmacist for more information. Do not breast-feed an infant while taking this medicine. This medicine has caused ovarian failure in some women and reduced sperm counts in some men This medicine may interfere with the ability to have a child. Talk with your doctor or health care professional if you are concerned about your fertility. This medicine may cause a decrease in Co-Enzyme Q-10. You should make sure that you get enough Co-Enzyme Q-10 while you are taking this medicine. Discuss the foods you eat and the vitamins you take with your health care professional. What side effects may I notice from receiving this medication? Side effects that you should report to your doctor or health care  professional as soon as possible: allergic reactions like skin rash, itching or hives, swelling of the face, lips, or tongue breathing problems chest pain fast or irregular heartbeat low blood counts - this medicine may decrease the number of white blood cells, red blood cells and platelets. You may be at increased risk for infections and bleeding. pain, redness, or irritation at site where injected signs of infection - fever or chills, cough, sore throat, pain or difficulty passing urine signs of decreased platelets or bleeding - bruising, pinpoint red spots on the skin, black, tarry stools, blood in the urine swelling of the ankles, feet, hands tiredness weakness Side effects that usually do not require medical attention (report to your doctor or health care professional if they continue or are bothersome): diarrhea hair loss mouth sores nail discoloration or damage nausea red colored urine vomiting This list may not describe all possible side effects. Call your doctor for medical advice about side effects. You may report side effects to FDA at 1-800-FDA-1088. Where should I keep my medication? This drug is given in  a hospital or clinic and will not be stored at home. NOTE: This sheet is a summary. It may not cover all possible information. If you have questions about this medicine, talk to your doctor, pharmacist, or health care provider.  2022 Elsevier/Gold Standard (2017-03-03 00:00:00) Vinblastine injection What is this medication? VINBLASTINE (vin BLAS teen) is a chemotherapy drug. It slows the growth of cancer cells. This medicine is used to treat many types of cancer like breast cancer, testicular cancer, Hodgkin's disease, non-Hodgkin's lymphoma, and sarcoma. This medicine may be used for other purposes; ask your health care provider or pharmacist if you have questions. COMMON BRAND NAME(S): Velban What should I tell my care team before I take this medication? They need to  know if you have any of these conditions: blood disorders dental disease gout infection (especially a virus infection such as chickenpox, cold sores, or herpes) liver disease lung disease nervous system disease recent or ongoing radiation therapy an unusual or allergic reaction to vinblastine, other chemotherapy agents, other medicines, foods, dyes, or preservatives pregnant or trying to get pregnant breast-feeding How should I use this medication? This drug is given as an infusion into a vein. It is administered in a hospital or clinic by a specially trained health care professional. If you have pain, swelling, burning or any unusual feeling around the site of your injection, tell your health care professional right away. Talk to your pediatrician regarding the use of this medicine in children. While this drug may be prescribed for selected conditions, precautions do apply. Overdosage: If you think you have taken too much of this medicine contact a poison control center or emergency room at once. NOTE: This medicine is only for you. Do not share this medicine with others. What if I miss a dose? It is important not to miss your dose. Call your doctor or health care professional if you are unable to keep an appointment. What may interact with this medication? erythromycin certain medicines for fungal infections like itraconazole, ketoconazole, posaconazole, voriconazole certain medicines for seizures like phenytoin This list may not describe all possible interactions. Give your health care provider a list of all the medicines, herbs, non-prescription drugs, or dietary supplements you use. Also tell them if you smoke, drink alcohol, or use illegal drugs. Some items may interact with your medicine. What should I watch for while using this medication? Your condition will be monitored carefully while you are receiving this medicine. You will need important blood work done while you are taking  this medicine. This drug may make you feel generally unwell. This is not uncommon, as chemotherapy can affect healthy cells as well as cancer cells. Report any side effects. Continue your course of treatment even though you feel ill unless your doctor tells you to stop. In some cases, you may be given additional medicines to help with side effects. Follow all directions for their use. Call your doctor or health care professional for advice if you get a fever, chills or sore throat, or other symptoms of a cold or flu. Do not treat yourself. This drug decreases your body's ability to fight infections. Try to avoid being around people who are sick. This medicine may increase your risk to bruise or bleed. Call your doctor or health care professional if you notice any unusual bleeding. Be careful brushing and flossing your teeth or using a toothpick because you may get an infection or bleed more easily. If you have any dental work done, tell your dentist  you are receiving this medicine. Avoid taking products that contain aspirin, acetaminophen, ibuprofen, naproxen, or ketoprofen unless instructed by your doctor. These medicines may hide a fever. Do not become pregnant while taking this medicine. Women should inform their doctor if they wish to become pregnant or think they might be pregnant. There is a potential for serious side effects to an unborn child. Talk to your health care professional or pharmacist for more information. Do not breast-feed an infant while taking this medicine. Men may have a lower sperm count while taking this medicine. Talk to your doctor if you plan to father a child. What side effects may I notice from receiving this medication? Side effects that you should report to your doctor or health care professional as soon as possible: allergic reactions like skin rash, itching or hives, swelling of the face, lips, or tongue low blood counts - This drug may decrease the number of white  blood cells, red blood cells and platelets. You may be at increased risk for infections and bleeding. signs of infection - fever or chills, cough, sore throat, pain or difficulty passing urine signs of decreased platelets or bleeding - bruising, pinpoint red spots on the skin, black, tarry stools, nosebleeds signs of decreased red blood cells - unusually weak or tired, fainting spells, lightheadedness breathing problems changes in hearing change in the amount of urine chest pain high blood pressure mouth sores nausea and vomiting pain, swelling, redness or irritation at the injection site pain, tingling, numbness in the hands or feet problems with balance, dizziness seizures Side effects that usually do not require medical attention (report to your doctor or health care professional if they continue or are bothersome): constipation hair loss jaw pain loss of appetite sensitivity to light stomach pain tumor pain This list may not describe all possible side effects. Call your doctor for medical advice about side effects. You may report side effects to FDA at 1-800-FDA-1088. Where should I keep my medication? This drug is given in a hospital or clinic and will not be stored at home. NOTE: This sheet is a summary. It may not cover all possible information. If you have questions about this medicine, talk to your doctor, pharmacist, or health care provider.  2022 Elsevier/Gold Standard (2021-03-17 00:00:00) [facility]  Discharge Instructions: Thank you for choosing [facility] to provide your oncology and hematology care.  If you have a lab appointment with the [facility], please go directly to the [facility] and check in at the registration area.  Wear comfortable clothing and clothing appropriate for easy access to any Portacath or PICC line.   We strive to give you quality time with your provider. You may need to reschedule your  appointment if you arrive late (15 or more minutes).  Arriving late affects you and other patients whose appointments are after yours.  Also, if you miss three or more appointments without notifying the office, you may be dismissed from the clinic at the providers discretion.      For prescription refill requests, have your pharmacy contact our office and allow 72 hours for refills to be completed.    Today you received the following chemotherapy and/or immunotherapy agents      To help prevent nausea and vomiting after your treatment, we encourage you to take your nausea medication as directed.  BELOW ARE SYMPTOMS THAT SHOULD BE REPORTED IMMEDIATELY: *FEVER GREATER THAN 100.4 F (38 C) OR HIGHER *CHILLS OR  SWEATING *NAUSEA AND VOMITING THAT IS NOT CONTROLLED WITH YOUR NAUSEA MEDICATION *UNUSUAL SHORTNESS OF BREATH *UNUSUAL BRUISING OR BLEEDING *URINARY PROBLEMS (pain or burning when urinating, or frequent urination) *BOWEL PROBLEMS (unusual diarrhea, constipation, pain near the anus) TENDERNESS IN MOUTH AND THROAT WITH OR WITHOUT PRESENCE OF ULCERS (sore throat, sores in mouth, or a toothache) UNUSUAL RASH, SWELLING OR PAIN  UNUSUAL VAGINAL DISCHARGE OR ITCHING   Items with * indicate a potential emergency and should be followed up as soon as possible or go to the Emergency Department if any problems should occur.  Please show the CHEMOTHERAPY ALERT CARD or IMMUNOTHERAPY ALERT CARD at check-in to the Emergency Department and triage nurse.  Should you have questions after your visit or need to cancel or reschedule your appointment, please contact [facility] CANCER [place] AT [place]  [phone] and follow the prompts.  Office hours are 8:00 a.m. to 4:30 p.m. Monday - Friday. Please note that voicemails left after 4:00 p.m. may not be returned until the following business day.  We are closed weekends and major holidays. You have access to a nurse at all times for urgent questions.  Please call the main number to the clinic [phone] and follow the prompts.  For any non-urgent questions, you may also contact your provider using MyChart. We now offer e-Visits for anyone 103 and older to request care online for non-urgent symptoms. For details visit [email or web address].   Also download the MyChart app! Go to the app store, search "MyChart", open the app, select [place], and log in with your MyChart username and password.  Due to Covid, a mask is required upon entering the hospital/clinic. If you do not have a mask, one will be given to you upon arrival. For doctor visits, patients may have 1 support person aged 69 or older with them. For treatment visits, patients cannot have anyone with them due to current Covid guidelines and our immunocompromised population. Brentuximab vedotin solution for injection What is this medication? BRENTUXIMAB VEDOTIN (bren TUX see mab ve DOE tin) is a monoclonal antibody and a chemotherapy drug. It is used for treating Hodgkin lymphoma and certain non-Hodgkin lymphomas, such as anaplastic large-cell lymphoma, mycosis fungoides, and peripheral T-cell lymphoma. This medicine may be used for other purposes; ask your health care provider or pharmacist if you have questions. COMMON BRAND NAME(S): ADCETRIS What should I tell my care team before I take this medication? They need to know if you have any of these conditions: immune system problems infection (especially a virus infection such as chickenpox, cold sores, or herpes) kidney disease liver disease low blood counts, like low white cell, platelet, or red cell counts tingling of the fingers or toes, or other nerve disorder an unusual or allergic reaction to brentuximab vedotin, other medicines, foods, dyes, or preservatives pregnant or trying to get pregnant breast-feeding How should I use this medication? This medicine is for infusion into a vein. It is given by a health care  professional in a hospital or clinic setting. Talk to your pediatrician regarding the use of this medicine in children. Special care may be needed. Overdosage: If you think you have taken too much of this medicine contact a poison control center or emergency room at once. NOTE: This medicine is only for you. Do not share this medicine with others. What if I miss a dose? It is important not to miss your dose. Call your doctor or health care professional if you are unable to keep an appointment.  What may interact with this medication? Do not take this medicine with any of the following medications: bleomycin This medicine may also interact with the following medications: ketoconazole rifampin St. John's wort; Hypericum perforatum This list may not describe all possible interactions. Give your health care provider a list of all the medicines, herbs, non-prescription drugs, or dietary supplements you use. Also tell them if you smoke, drink alcohol, or use illegal drugs. Some items may interact with your medicine. What should I watch for while using this medication? Visit your doctor for checks on your progress. This drug may make you feel generally unwell. Report any side effects. Continue your course of treatment even though you feel ill unless your doctor tells you to stop. Call your doctor or health care professional for advice if you get a fever, chills or sore throat, or other symptoms of a cold or flu. Do not treat yourself. This drug decreases your body's ability to fight infections. Try to avoid being around people who are sick. This medicine may increase your risk to bruise or bleed. Call your doctor or health care professional if you notice any unusual bleeding. In some patients, this medicine may cause a serious brain infection that may cause death. If you have any problems seeing, thinking, speaking, walking, or standing, tell your doctor right away. If you cannot reach your doctor,  urgently seek other source of medical care. Do not become pregnant while taking this medicine or for 6 months after stopping it. Women should inform their doctor if they wish to become pregnant or think they might be pregnant. Men should not father a child while taking this medicine and for 6 months after stopping it. There is a potential for serious side effects to an unborn child. Talk to your health care professional or pharmacist for more information. Do not breast-feed an infant while taking this medicine. This may interfere with the ability to father a child. You should talk to your doctor or health care professional if you are concerned about your fertility. What side effects may I notice from receiving this medication? Side effects that you should report to your doctor or health care professional as soon as possible: allergic reactions like skin rash, itching or hives, swelling of the face, lips, or tongue changes in emotions or moods diarrhea low blood counts - this medicine may decrease the number of white blood cells, red blood cells and platelets. You may be at increased risk for infections and bleeding. pain, tingling, numbness in the hands or feet redness, blistering, peeling or loosening of the skin, including inside the mouth shortness of breath signs of infection - fever or chills, cough, sore throat, pain or difficulty passing urine signs of decreased platelets or bleeding - bruising, pinpoint red spots on the skin, black, tarry stools, blood in the urine signs of decreased red blood cells - unusually weak or tired, fainting spells, lightheadedness signs of liver injury like dark yellow or brown urine; general ill feeling or flu-like symptoms; light-colored stools; loss of appetite; nausea; right upper belly pain; yellowing of the eyes or skin stomach pain sudden numbness or weakness of the face, arm or leg vomiting Side effects that usually do not require medical attention  (report to your doctor or health care professional if they continue or are bothersome): constipation dizziness headache muscle pain tiredness This list may not describe all possible side effects. Call your doctor for medical advice about side effects. You may report side effects to FDA  at 1-800-FDA-1088. Where should I keep my medication? This drug is given in a hospital or clinic and will not be stored at home. NOTE: This sheet is a summary. It may not cover all possible information. If you have questions about this medicine, talk to your doctor, pharmacist, or health care provider.  2022 Elsevier/Gold Standard (2021-03-17 00:00:00)

## 2021-08-12 NOTE — Progress Notes (Signed)
Hematology/Oncology Consult note [facility]  [phone] [phone]  Patient Care Team: Maryland Pink, MD as PCP - General (Family Medicine) Sindy Guadeloupe, MD as Consulting Physician (Oncology)   Name of the patient: Erik Buchanan  [id number]  12-03-1960   Date of visit: 08/12/21  Diagnosis- stage III classical Hodgkin's lymphoma  Chief complaint/ Reason for visit-on treatment assessment prior to cycle 3-day 15 of BV AVD chemotherapy  Heme/Onc history: patient is a 61 year old male with a past medical history significant for hypertension and diabetes among other medical problems.  He was noted to have left-sided neck swelling which has been ongoing for the last 4 months but he feels that the swelling has slowly increased in size.  He was seen by Dr. Kary Kos and underwent CT chest and CT soft tissue neck after initial ultrasound of the neck.  CT showed bulky supraclavicular adenopathy with the largest node measuring 4.1 cm CT chest with contrast also showed similar findings and no evidence of right supraclavicular axillary mediastinal or hilar adenopathy.  Lungs were clear.  Multiple hypodense lesions in the spleen which nearly replaces the splenic parenchyma.  Spleen however does not appear enlarged.    PET CT scan showed FDG avid adenopathy within the lower left neck to left supraclavicular region as well as small bowel mesentery peritoneum and porta hepatic lesions.  Multiple FDG avid splenic lesions.  Mild diffuse increased uptake in the bone marrow nonspecific.  No focal areas of increased uptake to suggest bone metastases.   Excisional supraclavicular lymph node biopsy showed classical Hodgkin's lymphoma. The H&E stained sections reveal lymph node effaced by a somewhat nodular proliferation of large, abnormal lymphoid cells, including "mummified" cells, "lacunar" cells, and occasional binucleated cells with classic Reed-Sternberg  morphology, in a background of predominantly small lymphocytes.  EBV negative   Stage III classical Hodgkin's lymphoma IPI score 2.5-year freedom from progression of 80% and 91% overall survival.  Interim scans after 2 cycles ofBV AVD chemotherapy showed excellent response to treatment with resolution of splenic lesions and marked reduction in the size and activity of adenopathy in the neck chest and abdomen.  Most of the lymph nodes were Deauville 1 and others were Deauville 2.  Plan is to complete 6 cycles of BV AVD chemotherapy  Interval history-blood sugars have been running in the 200s to 300s range at home.  He is currently on 2000 mg of metformin as well as glipizide 10 mg twice daily.  He also has a return appointment with his PCP in 2 weeks time.  He will try to cut back on his Decadron for nausea but still landed up requiring 1 dose as other nausea medications were not controlling his symptoms as well.  Denies any peripheral neuropathy in his hands and feet.  He does have some left forearm pain which lasts a few days after chemotherapy and then gets better.  He has been using as needed oxycodone for this sparingly  ECOG PS- 0 Pain scale- 0   Review of systems- Review of Systems  Constitutional:  Negative for chills, fever, malaise/fatigue and weight loss.  HENT:  Negative for congestion, ear discharge and nosebleeds.   Eyes:  Negative for blurred vision.  Respiratory:  Negative for cough, hemoptysis, sputum production, shortness of breath and wheezing.   Cardiovascular:  Negative for chest pain, palpitations, orthopnea and claudication.  Gastrointestinal:  Negative for abdominal pain, blood in stool, constipation, diarrhea, heartburn, melena, nausea and vomiting.  Genitourinary:  Negative for dysuria, flank pain, frequency, hematuria and urgency.  Musculoskeletal:  Negative for back pain, joint pain and myalgias.  Skin:  Negative for rash.  Neurological:  Negative for dizziness,  tingling, focal weakness, seizures, weakness and headaches.  Endo/Heme/Allergies:  Does not bruise/bleed easily.  Psychiatric/Behavioral:  Negative for depression and suicidal ideas. The patient does not have insomnia.       Allergies  Allergen Reactions   Ultram [Tramadol Hcl] Other (See Comments)    vertigo and passed out     Past Medical History:  Diagnosis Date   Anemia    Colon polyps    Depression    Diabetes mellitus without complication (HCC)    GERD (gastroesophageal reflux disease)    Hemorrhoids    Hyperlipidemia    Hypertension    Pneumonia    Testicular hypofunction    Tinnitus      Past Surgical History:  Procedure Laterality Date   COLONOSCOPY WITH PROPOFOL N/A 04/07/2015   Procedure: COLONOSCOPY WITH PROPOFOL;  Surgeon: Hulen Luster, MD;  Location[facility] ENDOSCOPY;  Service: Gastroenterology;  Laterality: N/A;   COLONOSCOPY WITH PROPOFOL N/A 09/01/2020   Procedure: COLONOSCOPY WITH PROPOFOL;  Surgeon: Toledo, Benay Pike, MD;  Location: [facility] ENDOSCOPY;  Service: Gastroenterology;  Laterality: N/A;   EYE SURGERY     cataract   MASS BIOPSY Left 05/07/2021   Procedure: NECK MASS BIOPSY,;  Surgeon: Jules Husbands, MD;  Location: [facility] ORS;  Service: General;  Laterality: Left;  Provider requesting 1.5 hours / 90 minutes for procedure.   PORTACATH PLACEMENT Right 05/21/2021   Procedure: INSERTION PORT-A-CATH;  Surgeon: Jules Husbands, MD;  Location: [facility] ORS;  Service: General;  Laterality: Right;    Social History   Socioeconomic History   Marital status: Married    Spouse name: [name]   Number of children: Not on file   Years of education: Not on file   Highest education level: Not on file  Occupational History   Not on file  Tobacco Use   Smoking status: Never   Smokeless tobacco: Never  Vaping Use   Vaping Use: Never used  Substance and Sexual Activity   Alcohol use: Never   Drug use: Never   Sexual activity: Yes  Other Topics Concern   Not on file   Social History Narrative   Not on file   Social Determinants of Health   Financial Resource Strain: Not on file  Food Insecurity: Not on file  Transportation Needs: Not on file  Physical Activity: Not on file  Stress: Not on file  Social Connections: Not on file  Intimate Partner Violence: Not on file    Family History  Problem Relation Age of Onset   Arthritis Mother    Stroke Paternal Aunt      Current Outpatient Medications:    amLODipine (NORVASC) 5 MG tablet, Take 7.5 mg by mouth daily., Disp: , Rfl:    buPROPion (WELLBUTRIN XL) 150 MG 24 hr tablet, Take 450 mg by mouth daily., Disp: , Rfl:    dexamethasone (DECADRON) 4 MG tablet, Take 2 tablets by mouth once a day for 3 days after chemo. Take with food., Disp: 30 tablet, Rfl: 1   glipiZIDE (GLUCOTROL XL) 5 MG 24 hr tablet, Take 10 mg by mouth 2 (two) times daily., Disp: , Rfl:    Homeopathic Products (LEG CRAMPS) TABS, Take by mouth as needed. Pt taking for leg cramps., Disp: , Rfl:    lidocaine-prilocaine (  EMLA) cream, Apply to affected area once, Disp: 30 g, Rfl: 3   lisinopril (PRINIVIL,ZESTRIL) 10 MG tablet, Take 10 mg by mouth daily., Disp: , Rfl:    loratadine (CLARITIN) 10 MG tablet, Take 10 mg by mouth daily as needed for allergies., Disp: , Rfl:    metFORMIN (GLUCOPHAGE-XR) 500 MG 24 hr tablet, Take 1,000 mg by mouth in the morning and at bedtime., Disp: , Rfl:    ondansetron (ZOFRAN) 8 MG tablet, Take 1 tablet (8 mg total) by mouth 2 (two) times daily as needed. Start on the third day after chemotherapy., Disp: 30 tablet, Rfl: 1   oxyCODONE (OXY IR/ROXICODONE) 5 MG immediate release tablet, Take 1 tablet (5 mg total) by mouth every 4 (four) hours as needed for severe pain., Disp: 30 tablet, Rfl: 0   pantoprazole (PROTONIX) 20 MG tablet, Take 1 tablet by mouth once daily, Disp: 30 tablet, Rfl: 0   simvastatin (ZOCOR) 20 MG tablet, Take 20 mg by mouth every evening., Disp: , Rfl:    sodium chloride (OCEAN) 0.65 %  SOLN nasal spray, Place 1 spray into both nostrils as needed for congestion., Disp: , Rfl:    acidophilus (RISAQUAD) CAPS capsule, Take 1 capsule by mouth every evening. (Patient not taking: Reported on 08/12/2021), Disp: , Rfl:    ibuprofen (ADVIL,MOTRIN) 200 MG tablet, Take 200-400 mg by mouth every 6 (six) hours as needed for moderate pain. (Patient not taking: Reported on 08/12/2021), Disp: , Rfl:    LORazepam (ATIVAN) 0.5 MG tablet, Take 1 tablet (0.5 mg total) by mouth every 6 (six) hours as needed (Nausea or vomiting). (Patient not taking: Reported on 08/12/2021), Disp: 30 tablet, Rfl: 0   Multiple Vitamin (MULTIVITAMIN PO), Take 1 Package by mouth daily. (Patient not taking: Reported on 08/12/2021), Disp: , Rfl:    Testosterone 1.62 % GEL, Apply 2 Pump topically in the morning. Applied to shoulders/upper arms, Disp: , Rfl:    tetrahydrozoline-zinc (VISINE-AC) 0.05-0.25 % ophthalmic solution, Place 2 drops into both eyes 3 (three) times daily as needed (dry eyes)., Disp: , Rfl:    Triamcinolone Acetonide (NASACORT AQ NA), Place 2 sprays into both nostrils daily as needed (allergies)., Disp: , Rfl:   Physical exam:  Vitals:   08/12/21 0911  BP: 140/85  Pulse: 87  Resp: 16  Temp: 98.3 F (36.8 C)  TempSrc: Oral  Weight: 236 lb 11.2 oz (107.4 kg)  Height: 5\' 11"  (1.803 m)   Physical Exam HENT:     Head: Normocephalic and atraumatic.  Eyes:     Pupils: Pupils are equal, round, and reactive to light.  Cardiovascular:     Rate and Rhythm: Normal rate and regular rhythm.     Heart sounds: Normal heart sounds.  Pulmonary:     Effort: Pulmonary effort is normal.     Breath sounds: Normal breath sounds.  Abdominal:     General: Bowel sounds are normal.     Palpations: Abdomen is soft.  Musculoskeletal:     Cervical back: Normal range of motion.  Skin:    General: Skin is warm and dry.  Neurological:     Mental Status: He is alert and oriented to person, place, and time.     CMP Latest  Ref Rng & Units 07/29/2021  Glucose 70 - 99 mg/dL 381(H)  BUN 6 - 20 mg/dL 21(H)  Creatinine 0.61 - 1.24 mg/dL 0.87  Sodium 135 - 145 mmol/L 133(L)  Potassium 3.5 - 5.1 mmol/L 4.1  Chloride 98 - 111 mmol/L 99  CO2 22 - 32 mmol/L 22  Calcium 8.9 - 10.3 mg/dL 9.2  Total Protein 6.5 - 8.1 g/dL 6.9  Total Bilirubin 0.3 - 1.2 mg/dL 0.4  Alkaline Phos 38 - 126 U/L 98  AST 15 - 41 U/L 20  ALT 0 - 44 U/L 29   CBC Latest Ref Rng & Units 07/29/2021  WBC 4.0 - 10.5 K/uL 10.4  Hemoglobin 13.0 - 17.0 g/dL 11.6(L)  Hematocrit 39.0 - 52.0 % 34.8(L)  Platelets 150 - 400 K/uL 259    No images are attached to the encounter.  NM PET Image Restag (PS) Skull Base To Thigh  Result Date: 07/29/2021 CLINICAL DATA:  Subsequent treatment strategy for Hodgkin's lymphoma. EXAM: NUCLEAR MEDICINE PET SKULL BASE TO THIGH TECHNIQUE: 13.6 mCi F-18 FDG was injected intravenously. Full-ring PET imaging was performed from the skull base to thigh after the radiotracer. CT data was obtained and used for attenuation correction and anatomic localization. Fasting blood glucose: 242 mg/dl COMPARISON:  Multiple exams, including 05/06/2021 FINDINGS: Mediastinal blood pool activity: SUV max 2.4 Liver activity: SUV max 3.3 NECK: Interval excision of most of the left level 4 adenopathy. A residual lymph node in this vicinity measures 0.8 cm in short axis on image 66 series 3 (formerly 2.1 cm) with maximum SUV 1.3 (Deauville 2), previously 9.8. No new adenopathy in the neck. Incidental CT findings: Chronic left maxillary sinusitis. CHEST: Left upper mediastinal prevascular node measures 0.3 cm in short axis on image 76 series 3, maximum SUV 0.9 (Deauville 2). This previously measured 0.9 cm in short axis with maximum SUV of 3.7. No new adenopathy in the chest. Incidental CT findings: Right Port-A-Cath tip: Cavoatrial junction. Mild cardiomegaly. ABDOMEN/PELVIS: Previous splenic lesions have resolved (Deauville 1). Substantial reduction in  size and activity of previous porta hepatis, retroperitoneal, and mesenteric adenopathy. One of the largest remaining mesenteric lymph nodes measures 1.5 cm in short axis on image 180 of series 3 with a maximum SUV of 1.8, Deauville 3. This previously measured 2.7 cm in short axis with a maximum SUV of 14.6. Index portacaval node 0.9 cm in short axis on image 163 series 3 with maximum SUV 2.3 (Deauville 2). This previously measured 1.8 cm in short axis with a maximum SUV of 7.3. No substantial new adenopathy is identified. Incidental CT findings: Diffuse hepatic steatosis. Hypodense hepatic lesions favoring cysts. SKELETON: Mild diffusely accentuated skeletal activity without substantial focal lesion. This may be caused by granulocyte stimulation. Incidental CT findings: Incidental pseudoarticulation of the left second rib with the left first rib anteriorly. Bridging spurring of the sacroiliac joints. IMPRESSION: 1. Marked improvement, with resolution of prior splenic lesions and marked reduction in size and activity of adenopathy in the lower neck, chest, and abdomen. Some of the prior lymph nodes have completely resolved (Deauville 1), others are substantially reduced in size and demonstrate Deauville 2 activity currently. 2. Mildly diffuse accentuated skeletal activity without substantial focal lesion, possibly from granulocyte stimulation. 3. Other imaging findings of potential clinical significance: Chronic left maxillary sinusitis. Mild cardiomegaly. Diffuse hepatic steatosis with suspected hepatic cysts. Electronically Signed   By: Van Clines M.D.   On: 07/29/2021 08:03     Assessment and plan- Patient is a 62 y.o. male with stage III classical Hodgkin's lymphoma here for on treatment assessment prior to cycle 3-day 15 of BV AVD chemotherapy  Counts okay to proceed with cycle 3-day 15 of BV AVD chemotherapy today with Neulasta  on day 3 .  I will see him back in 2 weeks for cycle 4-day  1.  Hyperglycemia:Blood sugars remain in the 300s today.  Oral medications have already been adjusted and optimized.  He has an upcoming appointment with Dr. Kary Kos in 2 weeks but I am concerned he may end up needing insulin given that his blood sugars have been consistently between 200s to 300s.  I am reducing Decadron dose to 4 mg in his premedications today and have asked him to use Decadron for his nausea sparingly only if other nausea medications do not work.  Neulasta associated bone pain: Continue as needed oxycodone   Visit Diagnosis 1. Encounter for antineoplastic chemotherapy   2. 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 Randa Evens, MD, MPH [facility] [phone] 08/12/2021 9:06 AM

## 2021-08-12 NOTE — Progress Notes (Signed)
Has sinus drainage all the time and it is not new. He tried to come off the steroids due to raises sugar level and he got nauseated so he wants to take 1 decadron a day for 3 days due to the nausea. He did take zofran this am. He also has tingling and discomfort in left arm at times and uses aspercream mainly at elbow.

## 2021-08-14 ENCOUNTER — Inpatient Hospital Stay: Payer: BC Managed Care – PPO

## 2021-08-14 ENCOUNTER — Ambulatory Visit: Payer: BC Managed Care – PPO

## 2021-08-26 ENCOUNTER — Inpatient Hospital Stay: Payer: BC Managed Care – PPO

## 2021-08-26 ENCOUNTER — Inpatient Hospital Stay (HOSPITAL_BASED_OUTPATIENT_CLINIC_OR_DEPARTMENT_OTHER): Payer: BC Managed Care – PPO | Admitting: Oncology

## 2021-08-26 ENCOUNTER — Encounter: Payer: Self-pay | Admitting: Oncology

## 2021-08-26 ENCOUNTER — Other Ambulatory Visit: Payer: Self-pay

## 2021-08-26 VITALS — BP 137/76 | HR 80 | Temp 96.1°F | Resp 16 | Ht 71.0 in | Wt 233.0 lb

## 2021-08-26 DIAGNOSIS — C8178 Other classical Hodgkin lymphoma, lymph nodes of multiple sites: Secondary | ICD-10-CM

## 2021-08-26 DIAGNOSIS — C8103 Nodular lymphocyte predominant Hodgkin lymphoma, intra-abdominal lymph nodes: Secondary | ICD-10-CM

## 2021-08-26 DIAGNOSIS — C8190 Hodgkin lymphoma, unspecified, unspecified site: Secondary | ICD-10-CM | POA: Diagnosis not present

## 2021-08-26 LAB — COMPREHENSIVE METABOLIC PANEL
ALT: 30 U/L (ref 0–44)
AST: 21 U/L (ref 15–41)
Albumin: 4 g/dL (ref 3.5–5.0)
Alkaline Phosphatase: 88 U/L (ref 38–126)
Anion gap: 9 (ref 5–15)
BUN: 19 mg/dL (ref 6–20)
CO2: 20 mmol/L — ABNORMAL LOW (ref 22–32)
Calcium: 8.7 mg/dL — ABNORMAL LOW (ref 8.9–10.3)
Chloride: 103 mmol/L (ref 98–111)
Creatinine, Ser: 0.8 mg/dL (ref 0.61–1.24)
GFR, Estimated: >60 mL/min (ref >60)
Glucose, Bld: 274 mg/dL — ABNORMAL HIGH (ref 70–99)
Potassium: 4 mmol/L (ref 3.5–5.1)
Sodium: 132 mmol/L — ABNORMAL LOW (ref 135–145)
Total Bilirubin: <0.1 mg/dL — ABNORMAL LOW (ref 0.3–1.2)
Total Protein: 6.4 g/dL — ABNORMAL LOW (ref 6.5–8.1)

## 2021-08-26 LAB — CBC WITH DIFFERENTIAL/PLATELET
Abs Immature Granulocytes: 0.29 10*3/uL — ABNORMAL HIGH (ref 0.00–0.07)
Basophils Absolute: 0.1 10*3/uL (ref 0.0–0.1)
Basophils Relative: 1 %
Eosinophils Absolute: 0.1 10*3/uL (ref 0.0–0.5)
Eosinophils Relative: 1 %
HCT: 33 % — ABNORMAL LOW (ref 39.0–52.0)
Hemoglobin: 10.8 g/dL — ABNORMAL LOW (ref 13.0–17.0)
Immature Granulocytes: 3 %
Lymphocytes Relative: 12 %
Lymphs Abs: 1.3 10*3/uL (ref 0.7–4.0)
MCH: 28 pg (ref 26.0–34.0)
MCHC: 32.7 g/dL (ref 30.0–36.0)
MCV: 85.5 fL (ref 80.0–100.0)
Monocytes Absolute: 0.7 10*3/uL (ref 0.1–1.0)
Monocytes Relative: 6 %
Neutro Abs: 8.1 10*3/uL — ABNORMAL HIGH (ref 1.7–7.7)
Neutrophils Relative %: 77 %
Platelets: 237 10*3/uL (ref 150–400)
RBC: 3.86 MIL/uL — ABNORMAL LOW (ref 4.22–5.81)
RDW: 18.3 % — ABNORMAL HIGH (ref 11.5–15.5)
WBC: 10.4 10*3/uL (ref 4.0–10.5)
nRBC: 0 % (ref 0.0–0.2)

## 2021-08-26 MED ORDER — SODIUM CHLORIDE 0.9% FLUSH
10.0000 mL | INTRAVENOUS | Status: DC | PRN
  Administered 2021-08-26: 10 mL via INTRAVENOUS
  Filled 2021-08-26: qty 10

## 2021-08-26 MED ORDER — HEPARIN SOD (PORK) LOCK FLUSH 100 UNIT/ML IV SOLN
500.0000 [IU] | Freq: Once | INTRAVENOUS | Status: AC
  Administered 2021-08-26: 500 [IU] via INTRAVENOUS
  Filled 2021-08-26: qty 5

## 2021-08-28 ENCOUNTER — Ambulatory Visit: Payer: BC Managed Care – PPO

## 2021-08-30 ENCOUNTER — Encounter: Payer: Self-pay | Admitting: Oncology

## 2021-08-30 NOTE — Progress Notes (Signed)
I connected with Erik Buchanan on 08/30/21 at  8:45 AM EST by video enabled telemedicine visit and verified that I am speaking with the correct person using two identifiers.   I discussed the limitations, risks, security and privacy concerns of performing an evaluation and management service by telemedicine and the availability of in-person appointments. I also discussed with the patient that there may be a patient responsible charge related to this service. The patient expressed understanding and agreed to proceed.  Other persons participating in the visit and their role in the encounter:  patients wife  Patient's location:  cancer center Provider's location:  home (due to illness)  Diagnosis- stage III classical Hodgkin's lymphoma  Chief Complaint: On treatment assessment prior to cycle 4-day 1 of BV AVD chemotherapy  History of present illness: patient is a 61 year old male with a past medical history significant for hypertension and diabetes among other medical problems.  He was noted to have left-sided neck swelling which has been ongoing for the last 4 months but he feels that the swelling has slowly increased in size.  He was seen by Dr. Kary Kos and underwent CT chest and CT soft tissue neck after initial ultrasound of the neck.  CT showed bulky supraclavicular adenopathy with the largest node measuring 4.1 cm CT chest with contrast also showed similar findings and no evidence of right supraclavicular axillary mediastinal or hilar adenopathy.  Lungs were clear.  Multiple hypodense lesions in the spleen which nearly replaces the splenic parenchyma.  Spleen however does not appear enlarged.    PET CT scan showed FDG avid adenopathy within the lower left neck to left supraclavicular region as well as small bowel mesentery peritoneum and porta hepatic lesions.  Multiple FDG avid splenic lesions.  Mild diffuse increased uptake in the bone marrow nonspecific.  No focal areas of increased uptake  to suggest bone metastases.   Excisional supraclavicular lymph node biopsy showed classical Hodgkin's lymphoma. The H&E stained sections reveal lymph node effaced by a somewhat nodular proliferation of large, abnormal lymphoid cells, including "mummified" cells, "lacunar" cells, and occasional binucleated cells with classic Reed-Sternberg morphology, in a background of predominantly small lymphocytes.  EBV negative   Stage III classical Hodgkin's lymphoma IPI score 2.5-year freedom from progression of 80% and 91% overall survival.  Interval history patient states that he has not been feeling well since this morning.  He feels somewhat lightheaded.  Does not feel that he can go through chemotherapy today.  He does report that his appetite is otherwise good and he has been drinking plenty of fluids at home.  Denies any fever   Review of Systems  Constitutional:  Positive for malaise/fatigue. Negative for chills, fever and weight loss.  HENT:  Negative for congestion, ear discharge and nosebleeds.   Eyes:  Negative for blurred vision.  Respiratory:  Negative for cough, hemoptysis, sputum production, shortness of breath and wheezing.   Cardiovascular:  Negative for chest pain, palpitations, orthopnea and claudication.  Gastrointestinal:  Negative for abdominal pain, blood in stool, constipation, diarrhea, heartburn, melena, nausea and vomiting.  Genitourinary:  Negative for dysuria, flank pain, frequency, hematuria and urgency.  Musculoskeletal:  Negative for back pain, joint pain and myalgias.  Skin:  Negative for rash.  Neurological:  Negative for dizziness, tingling, focal weakness, seizures, weakness and headaches.  Endo/Heme/Allergies:  Does not bruise/bleed easily.  Psychiatric/Behavioral:  Negative for depression and suicidal ideas. The patient does not have insomnia.    Allergies  Allergen Reactions  Ultram [Tramadol Hcl] Other (See Comments)    vertigo and passed out    Past  Medical History:  Diagnosis Date   Anemia    Colon polyps    Depression    Diabetes mellitus without complication (HCC)    GERD (gastroesophageal reflux disease)    Hemorrhoids    Hodgkin's lymphoma (HCC)    Hyperlipidemia    Hypertension    Pneumonia    Testicular hypofunction    Tinnitus     Past Surgical History:  Procedure Laterality Date   COLONOSCOPY WITH PROPOFOL N/A 04/07/2015   Procedure: COLONOSCOPY WITH PROPOFOL;  Surgeon: Hulen Luster, MD;  Location[facility] ENDOSCOPY;  Service: Gastroenterology;  Laterality: N/A;   COLONOSCOPY WITH PROPOFOL N/A 09/01/2020   Procedure: COLONOSCOPY WITH PROPOFOL;  Surgeon: Toledo, Benay Pike, MD;  Location: [facility] ENDOSCOPY;  Service: Gastroenterology;  Laterality: N/A;   EYE SURGERY     cataract   MASS BIOPSY Left 05/07/2021   Procedure: NECK MASS BIOPSY,;  Surgeon: Jules Husbands, MD;  Location: [facility] ORS;  Service: General;  Laterality: Left;  Provider requesting 1.5 hours / 90 minutes for procedure.   PORTACATH PLACEMENT Right 05/21/2021   Procedure: INSERTION PORT-A-CATH;  Surgeon: Jules Husbands, MD;  Location: [facility] ORS;  Service: General;  Laterality: Right;    Social History   Socioeconomic History   Marital status: Married    Spouse name: [name]   Number of children: Not on file   Years of education: Not on file   Highest education level: Not on file  Occupational History   Not on file  Tobacco Use   Smoking status: Never   Smokeless tobacco: Never  Vaping Use   Vaping Use: Never used  Substance and Sexual Activity   Alcohol use: Never   Drug use: Never   Sexual activity: Yes  Other Topics Concern   Not on file  Social History Narrative   Not on file   Social Determinants of Health   Financial Resource Strain: Not on file  Food Insecurity: Not on file  Transportation Needs: Not on file  Physical Activity: Not on file  Stress: Not on file  Social Connections: Not on file  Intimate Partner Violence: Not on file     Family History  Problem Relation Age of Onset   Arthritis Mother    Stroke Paternal Aunt      Current Outpatient Medications:    amLODipine (NORVASC) 5 MG tablet, Take 7.5 mg by mouth daily., Disp: , Rfl:    buPROPion (WELLBUTRIN XL) 150 MG 24 hr tablet, Take 450 mg by mouth daily., Disp: , Rfl:    dexamethasone (DECADRON) 4 MG tablet, Take 2 tablets by mouth once a day for 3 days after chemo. Take with food., Disp: 30 tablet, Rfl: 1   glipiZIDE (GLUCOTROL XL) 5 MG 24 hr tablet, Take 10 mg by mouth 2 (two) times daily., Disp: , Rfl:    Homeopathic Products (LEG CRAMPS) TABS, Take by mouth as needed. Pt taking for leg cramps., Disp: , Rfl:    insulin glargine (LANTUS) 100 UNIT/ML injection, Inject into the skin., Disp: , Rfl:    lidocaine-prilocaine (EMLA) cream, Apply to affected area once, Disp: 30 g, Rfl: 3   lisinopril (PRINIVIL,ZESTRIL) 10 MG tablet, Take 10 mg by mouth daily., Disp: , Rfl:    loratadine (CLARITIN) 10 MG tablet, Take 10 mg by mouth daily as needed for allergies., Disp: , Rfl:    metFORMIN (GLUCOPHAGE-XR) 500  MG 24 hr tablet, Take 1,000 mg by mouth in the morning and at bedtime., Disp: , Rfl:    ondansetron (ZOFRAN) 8 MG tablet, Take 1 tablet (8 mg total) by mouth 2 (two) times daily as needed. Start on the third day after chemotherapy., Disp: 30 tablet, Rfl: 1   oxyCODONE (OXY IR/ROXICODONE) 5 MG immediate release tablet, Take 1 tablet (5 mg total) by mouth every 4 (four) hours as needed for severe pain., Disp: 30 tablet, Rfl: 0   pantoprazole (PROTONIX) 20 MG tablet, Take 1 tablet by mouth once daily, Disp: 30 tablet, Rfl: 0   simvastatin (ZOCOR) 20 MG tablet, Take 20 mg by mouth every evening., Disp: , Rfl:    sodium chloride (OCEAN) 0.65 % SOLN nasal spray, Place 1 spray into both nostrils as needed for congestion., Disp: , Rfl:    tetrahydrozoline-zinc (VISINE-AC) 0.05-0.25 % ophthalmic solution, Place 2 drops into both eyes 3 (three) times daily as needed (dry  eyes)., Disp: , Rfl:    Triamcinolone Acetonide (NASACORT AQ NA), Place 2 sprays into both nostrils daily as needed (allergies)., Disp: , Rfl:    trolamine salicylate (ASPERCREME) 10 % cream, Apply 1 application topically as needed for muscle pain., Disp: , Rfl:    acidophilus (RISAQUAD) CAPS capsule, Take 1 capsule by mouth every evening. (Patient not taking: Reported on 08/12/2021), Disp: , Rfl:    ibuprofen (ADVIL,MOTRIN) 200 MG tablet, Take 200-400 mg by mouth every 6 (six) hours as needed for moderate pain. (Patient not taking: Reported on 08/12/2021), Disp: , Rfl:    LORazepam (ATIVAN) 0.5 MG tablet, Take 1 tablet (0.5 mg total) by mouth every 6 (six) hours as needed (Nausea or vomiting). (Patient not taking: Reported on 08/12/2021), Disp: 30 tablet, Rfl: 0   Multiple Vitamin (MULTIVITAMIN PO), Take 1 Package by mouth daily. (Patient not taking: Reported on 08/12/2021), Disp: , Rfl:    Testosterone 1.62 % GEL, Apply 2 Pump topically in the morning. Applied to shoulders/upper arms (Patient not taking: Reported on 08/12/2021), Disp: , Rfl:   No results found.  No images are attached to the encounter.   CMP Latest Ref Rng & Units 08/26/2021  Glucose 70 - 99 mg/dL 274(H)  BUN 6 - 20 mg/dL 19  Creatinine 0.61 - 1.24 mg/dL 0.80  Sodium 135 - 145 mmol/L 132(L)  Potassium 3.5 - 5.1 mmol/L 4.0  Chloride 98 - 111 mmol/L 103  CO2 22 - 32 mmol/L 20(L)  Calcium 8.9 - 10.3 mg/dL 8.7(L)  Total Protein 6.5 - 8.1 g/dL 6.4(L)  Total Bilirubin 0.3 - 1.2 mg/dL <0.1(L)  Alkaline Phos 38 - 126 U/L 88  AST 15 - 41 U/L 21  ALT 0 - 44 U/L 30   CBC Latest Ref Rng & Units 08/26/2021  WBC 4.0 - 10.5 K/uL 10.4  Hemoglobin 13.0 - 17.0 g/dL 10.8(L)  Hematocrit 39.0 - 52.0 % 33.0(L)  Platelets 150 - 400 K/uL 237     Observation/objective: Appears in no acute distress over video visit today.  Breathing is nonlabored  Assessment and plan: Patient is a 61 year old male with history of stage III classical Hodgkin's  lymphoma here for on treatment assessment prior to cycle 4-day 1 of BV AVD chemotherapy  Patient states that he is not feeling well presently and still feels somewhat lightheaded.  He would like to go home and rest and take a break for this week before returning back to chemotherapy next week.  Blood sugars are better controlledAnd he is  presently on insulin at nighttime.  I will therefore hold off on giving him treatment today and he will directly return in 1 week's timeFor cycle 4-day 1 of BV AVD chemotherapy  Follow-up instructions:  I discussed the assessment and treatment plan with the patient. The patient was provided an opportunity to ask questions and all were answered. The patient agreed with the plan and demonstrated an understanding of the instructions.   The patient was advised to call back or seek an in-person evaluation if the symptoms worsen or if the condition fails to improve as anticipated.   Visit Diagnosis: 1. 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 Randa Evens, MD, MPH [facility] at [facility] Tel- [phone] 08/30/2021 11:13 AM

## 2021-09-02 ENCOUNTER — Inpatient Hospital Stay: Payer: BC Managed Care – PPO

## 2021-09-02 ENCOUNTER — Inpatient Hospital Stay (HOSPITAL_BASED_OUTPATIENT_CLINIC_OR_DEPARTMENT_OTHER): Payer: BC Managed Care – PPO | Admitting: Oncology

## 2021-09-02 ENCOUNTER — Other Ambulatory Visit: Payer: Self-pay

## 2021-09-02 ENCOUNTER — Encounter: Payer: Self-pay | Admitting: Oncology

## 2021-09-02 VITALS — BP 130/83 | HR 73 | Temp 97.1°F | Resp 16 | Ht 71.0 in | Wt 234.0 lb

## 2021-09-02 DIAGNOSIS — C8178 Other classical Hodgkin lymphoma, lymph nodes of multiple sites: Secondary | ICD-10-CM

## 2021-09-02 DIAGNOSIS — Z5111 Encounter for antineoplastic chemotherapy: Secondary | ICD-10-CM

## 2021-09-02 DIAGNOSIS — Z5112 Encounter for antineoplastic immunotherapy: Secondary | ICD-10-CM

## 2021-09-02 DIAGNOSIS — C8173 Other classical Hodgkin lymphoma, intra-abdominal lymph nodes: Secondary | ICD-10-CM

## 2021-09-02 DIAGNOSIS — C8190 Hodgkin lymphoma, unspecified, unspecified site: Secondary | ICD-10-CM | POA: Diagnosis not present

## 2021-09-02 LAB — COMPREHENSIVE METABOLIC PANEL
ALT: 29 U/L (ref 0–44)
AST: 21 U/L (ref 15–41)
Albumin: 4.2 g/dL (ref 3.5–5.0)
Alkaline Phosphatase: 59 U/L (ref 38–126)
Anion gap: 7 (ref 5–15)
BUN: 20 mg/dL (ref 6–20)
CO2: 23 mmol/L (ref 22–32)
Calcium: 9 mg/dL (ref 8.9–10.3)
Chloride: 106 mmol/L (ref 98–111)
Creatinine, Ser: 0.83 mg/dL (ref 0.61–1.24)
GFR, Estimated: >60 mL/min (ref >60)
Glucose, Bld: 149 mg/dL — ABNORMAL HIGH (ref 70–99)
Potassium: 4 mmol/L (ref 3.5–5.1)
Sodium: 136 mmol/L (ref 135–145)
Total Bilirubin: 0.5 mg/dL (ref 0.3–1.2)
Total Protein: 6.6 g/dL (ref 6.5–8.1)

## 2021-09-02 LAB — CBC WITH DIFFERENTIAL/PLATELET
Abs Immature Granulocytes: 0.04 10*3/uL (ref 0.00–0.07)
Basophils Absolute: 0 10*3/uL (ref 0.0–0.1)
Basophils Relative: 0 %
Eosinophils Absolute: 0 10*3/uL (ref 0.0–0.5)
Eosinophils Relative: 0 %
HCT: 33.9 % — ABNORMAL LOW (ref 39.0–52.0)
Hemoglobin: 11.1 g/dL — ABNORMAL LOW (ref 13.0–17.0)
Immature Granulocytes: 0 %
Lymphocytes Relative: 11 %
Lymphs Abs: 1.1 10*3/uL (ref 0.7–4.0)
MCH: 28 pg (ref 26.0–34.0)
MCHC: 32.7 g/dL (ref 30.0–36.0)
MCV: 85.6 fL (ref 80.0–100.0)
Monocytes Absolute: 0.5 10*3/uL (ref 0.1–1.0)
Monocytes Relative: 5 %
Neutro Abs: 8.2 10*3/uL — ABNORMAL HIGH (ref 1.7–7.7)
Neutrophils Relative %: 84 %
Platelets: 301 10*3/uL (ref 150–400)
RBC: 3.96 MIL/uL — ABNORMAL LOW (ref 4.22–5.81)
RDW: 17.7 % — ABNORMAL HIGH (ref 11.5–15.5)
WBC: 9.9 10*3/uL (ref 4.0–10.5)
nRBC: 0 % (ref 0.0–0.2)

## 2021-09-02 MED ORDER — SODIUM CHLORIDE 0.9 % IV SOLN
Freq: Once | INTRAVENOUS | Status: AC
  Filled 2021-09-02: qty 250

## 2021-09-02 MED ORDER — PALONOSETRON HCL INJECTION 0.25 MG/5ML
0.2500 mg | Freq: Once | INTRAVENOUS | Status: AC
  Administered 2021-09-02: 0.25 mg via INTRAVENOUS
  Filled 2021-09-02: qty 5

## 2021-09-02 MED ORDER — HEPARIN SOD (PORK) LOCK FLUSH 100 UNIT/ML IV SOLN
INTRAVENOUS | Status: AC
  Filled 2021-09-02: qty 5

## 2021-09-02 MED ORDER — ACETAMINOPHEN 325 MG PO TABS
650.0000 mg | ORAL_TABLET | Freq: Once | ORAL | Status: AC
  Administered 2021-09-02: 650 mg via ORAL
  Filled 2021-09-02: qty 2

## 2021-09-02 MED ORDER — SODIUM CHLORIDE 0.9 % IV SOLN
375.0000 mg/m2 | Freq: Once | INTRAVENOUS | Status: AC
  Administered 2021-09-02: 890 mg via INTRAVENOUS
  Filled 2021-09-02: qty 89

## 2021-09-02 MED ORDER — SODIUM CHLORIDE 0.9 % IV SOLN
150.0000 mg | Freq: Once | INTRAVENOUS | Status: AC
  Administered 2021-09-02: 150 mg via INTRAVENOUS
  Filled 2021-09-02: qty 150

## 2021-09-02 MED ORDER — PEGFILGRASTIM 6 MG/0.6ML ~~LOC~~ PSKT
6.0000 mg | PREFILLED_SYRINGE | Freq: Once | SUBCUTANEOUS | Status: AC
  Administered 2021-09-02: 6 mg via SUBCUTANEOUS
  Filled 2021-09-02: qty 0.6

## 2021-09-02 MED ORDER — SODIUM CHLORIDE 0.9 % IV SOLN
120.0000 mg | Freq: Once | INTRAVENOUS | Status: AC
  Administered 2021-09-02: 120 mg via INTRAVENOUS
  Filled 2021-09-02: qty 24

## 2021-09-02 MED ORDER — SODIUM CHLORIDE 0.9 % IV SOLN: 4.0000 mg | Freq: Once | INTRAVENOUS | Status: DC

## 2021-09-02 MED ORDER — VINBLASTINE SULFATE CHEMO INJECTION 1 MG/ML
6.0000 mg/m2 | Freq: Once | INTRAVENOUS | Status: AC
  Administered 2021-09-02: 14.2 mg via INTRAVENOUS
  Filled 2021-09-02: qty 14.2

## 2021-09-02 MED ORDER — DOXORUBICIN HCL CHEMO IV INJECTION 2 MG/ML
25.0000 mg/m2 | Freq: Once | INTRAVENOUS | Status: AC
  Administered 2021-09-02: 60 mg via INTRAVENOUS
  Filled 2021-09-02: qty 30

## 2021-09-02 MED ORDER — DEXAMETHASONE SODIUM PHOSPHATE 10 MG/ML IJ SOLN
4.0000 mg | Freq: Once | INTRAMUSCULAR | Status: AC
  Administered 2021-09-02: 4 mg via INTRAVENOUS
  Filled 2021-09-02: qty 1

## 2021-09-02 MED ORDER — DIPHENHYDRAMINE HCL 50 MG/ML IJ SOLN
50.0000 mg | Freq: Once | INTRAMUSCULAR | Status: AC
  Administered 2021-09-02: 50 mg via INTRAVENOUS
  Filled 2021-09-02: qty 1

## 2021-09-02 MED ORDER — HEPARIN SOD (PORK) LOCK FLUSH 100 UNIT/ML IV SOLN
500.0000 [IU] | Freq: Once | INTRAVENOUS | Status: AC | PRN
  Administered 2021-09-02: 500 [IU]
  Filled 2021-09-02: qty 5

## 2021-09-02 NOTE — Patient Instructions (Signed)
[facility] AT [place]  Discharge Instructions: Thank you for choosing [facility] to provide your oncology and hematology care.  If you have a lab appointment with the [facility], please go directly to the [facility] and check in at the registration area.  Wear comfortable clothing and clothing appropriate for easy access to any Portacath or PICC line.   We strive to give you quality time with your provider. You may need to reschedule your appointment if you arrive late (15 or more minutes).  Arriving late affects you and other patients whose appointments are after yours.  Also, if you miss three or more appointments without notifying the office, you may be dismissed from the clinic at the providers discretion.      For prescription refill requests, have your pharmacy contact our office and allow 72 hours for refills to be completed.    Today you received the following chemotherapy and/or immunotherapy agents Adriamycin, Vinblastine, Dacarbazine and Adcetris      To help prevent nausea and vomiting after your treatment, we encourage you to take your nausea medication as directed.  BELOW ARE SYMPTOMS THAT SHOULD BE REPORTED IMMEDIATELY: *FEVER GREATER THAN 100.4 F (38 C) OR HIGHER *CHILLS OR SWEATING *NAUSEA AND VOMITING THAT IS NOT CONTROLLED WITH YOUR NAUSEA MEDICATION *UNUSUAL SHORTNESS OF BREATH *UNUSUAL BRUISING OR BLEEDING *URINARY PROBLEMS (pain or burning when urinating, or frequent urination) *BOWEL PROBLEMS (unusual diarrhea, constipation, pain near the anus) TENDERNESS IN MOUTH AND THROAT WITH OR WITHOUT PRESENCE OF ULCERS (sore throat, sores in mouth, or a toothache) UNUSUAL RASH, SWELLING OR PAIN  UNUSUAL VAGINAL DISCHARGE OR ITCHING   Items with * indicate a potential emergency and should be followed up as soon as possible or go to the Emergency Department if any problems should occur.  Please show the CHEMOTHERAPY ALERT CARD or  IMMUNOTHERAPY ALERT CARD at check-in to the Emergency Department and triage nurse.  Should you have questions after your visit or need to cancel or reschedule your appointment, please contact [facility]CER [place] AT [place]  [phone] and follow the prompts.  Office hours are 8:00 a.m. to 4:30 p.m. Monday - Friday. Please note that voicemails left after 4:00 p.m. may not be returned until the following business day.  We are closed weekends and major holidays. You have access to a nurse at all times for urgent questions. Please call the main number to the clinic [phone] and follow the prompts.  For any non-urgent questions, you may also contact your provider using MyChart. We now offer e-Visits for anyone 23 and older to request care online for non-urgent symptoms. For details visit [email or web address].   Also download the MyChart app! Go to the app store, search "MyChart", open the app, select [facility], and log in with your MyChart username and password.  Due to Covid, a mask is required upon entering the hospital/clinic. If you do not have a mask, one will be given to you upon arrival. For doctor visits, patients may have 1 support person aged 28 or older with them. For treatment visits, patients cannot have anyone with them due to current Covid guidelines and our immunocompromised population.

## 2021-09-02 NOTE — Progress Notes (Signed)
Hematology/Oncology Consult note [facility]  Telephone:([phone] [phone]  Patient Care Team: [place] Pink, MD as PCP - General (Family Medicine) Sindy Guadeloupe, MD as Consulting Physician (Oncology)   Name of the patient: Erik Buchanan  [id number]  [birth year]   Date of visit: 09/02/21  Diagnosis- stage III classical Hodgkin's lymphoma  Chief complaint/ Reason for visit-on treatment assessment prior to cycle 4-day 1 of BV AVD chemotherapy  Heme/Onc history: patient is a 61 year old male with a past medical history significant for hypertension and diabetes among other medical problems.  He was noted to have left-sided neck swelling which has been ongoing for the last 4 months but he feels that the swelling has slowly increased in size.  He was seen by Dr. Kary Kos and underwent CT chest and CT soft tissue neck after initial ultrasound of the neck.  CT showed bulky supraclavicular adenopathy with the largest node measuring 4.1 cm CT chest with contrast also showed similar findings and no evidence of right supraclavicular axillary mediastinal or hilar adenopathy.  Lungs were clear.  Multiple hypodense lesions in the spleen which nearly replaces the splenic parenchyma.  Spleen however does not appear enlarged.    PET CT scan showed FDG avid adenopathy within the lower left neck to left supraclavicular region as well as small bowel mesentery peritoneum and porta hepatic lesions.  Multiple FDG avid splenic lesions.  Mild diffuse increased uptake in the bone marrow nonspecific.  No focal areas of increased uptake to suggest bone metastases.   Excisional supraclavicular lymph node biopsy showed classical Hodgkin's lymphoma. The H&E stained sections reveal lymph node effaced by a somewhat nodular proliferation of large, abnormal lymphoid cells, including "mummified" cells, "lacunar" cells, and occasional binucleated cells with classic Reed-Sternberg  morphology, in a background of predominantly small lymphocytes.  EBV negative   Stage III classical Hodgkin's lymphoma IPI score 2.5-year freedom from progression of 80% and 91% overall survival.  Interval history-he reports feeling better after getting a break from chemotherapy for a week.  Has occasional left forearm pain after chemotherapy and fatigue which last for about a day or 2.  Denies any persistent tingling numbness in his extremities.  ECOG PS- 1 Pain scale- 0   Review of systems- Review of Systems  Constitutional:  Positive for malaise/fatigue. Negative for chills, fever and weight loss.  HENT:  Negative for congestion, ear discharge and nosebleeds.   Eyes:  Negative for blurred vision.  Respiratory:  Negative for cough, hemoptysis, sputum production, shortness of breath and wheezing.   Cardiovascular:  Negative for chest pain, palpitations, orthopnea and claudication.  Gastrointestinal:  Negative for abdominal pain, blood in stool, constipation, diarrhea, heartburn, melena, nausea and vomiting.  Genitourinary:  Negative for dysuria, flank pain, frequency, hematuria and urgency.  Musculoskeletal:  Negative for back pain, joint pain and myalgias.  Skin:  Negative for rash.  Neurological:  Negative for dizziness, tingling, focal weakness, seizures, weakness and headaches.  Endo/Heme/Allergies:  Does not bruise/bleed easily.  Psychiatric/Behavioral:  Negative for depression and suicidal ideas. The patient does not have insomnia.      Allergies  Allergen Reactions   Ultram [Tramadol Hcl] Other (See Comments)    vertigo and passed out     Past Medical History:  Diagnosis Date   Anemia    Colon polyps    Depression    Diabetes mellitus without complication (HCC)    GERD (gastroesophageal reflux disease)    Hemorrhoids  Hodgkin's lymphoma ([place])    Hyperlipidemia    Hypertension    Pneumonia    Testicular hypofunction    Tinnitus      Past Surgical History:   Procedure Laterality Date   COLONOSCOPY WITH PROPOFOL N/A 04/07/2015   Procedure: COLONOSCOPY WITH PROPOFOL;  Surgeon: Hulen Luster, MD;  Location[facility] ENDOSCOPY;  Service: Gastroenterology;  Laterality: N/A;   COLONOSCOPY WITH PROPOFOL N/A 09/01/2020   Procedure: COLONOSCOPY WITH PROPOFOL;  Surgeon: Toledo, Benay Pike, MD;  Location: [facility] ENDOSCOPY;  Service: Gastroenterology;  Laterality: N/A;   EYE SURGERY     cataract   MASS BIOPSY Left 05/07/2021   Procedure: NECK MASS BIOPSY,;  Surgeon: Jules Husbands, MD;  Location: [facility] ORS;  Service: General;  Laterality: Left;  Provider requesting 1.5 hours / 90 minutes for procedure.   PORTACATH PLACEMENT Right 05/21/2021   Procedure: INSERTION PORT-A-CATH;  Surgeon: Jules Husbands, MD;  Location: [facility] ORS;  Service: General;  Laterality: Right;    Social History   Socioeconomic History   Marital status: Married    Spouse name: [name]   Number of children: Not on file   Years of education: Not on file   Highest education level: Not on file  Occupational History   Not on file  Tobacco Use   Smoking status: Never   Smokeless tobacco: Never  Vaping Use   Vaping Use: Never used  Substance and Sexual Activity   Alcohol use: Never   Drug use: Never   Sexual activity: Yes  Other Topics Concern   Not on file  Social History Narrative   Not on file   Social Determinants of Health   Financial Resource Strain: Not on file  Food Insecurity: Not on file  Transportation Needs: Not on file  Physical Activity: Not on file  Stress: Not on file  Social Connections: Not on file  Intimate Partner Violence: Not on file    Family History  Problem Relation Age of Onset   Arthritis Mother    Stroke Paternal Aunt      Current Outpatient Medications:    amLODipine (NORVASC) 5 MG tablet, Take 7.5 mg by mouth daily., Disp: , Rfl:    buPROPion (WELLBUTRIN XL) 150 MG 24 hr tablet, Take 450 mg by mouth daily., Disp: , Rfl:    dexamethasone  (DECADRON) 4 MG tablet, Take 2 tablets by mouth once a day for 3 days after chemo. Take with food., Disp: 30 tablet, Rfl: 1   glipiZIDE (GLUCOTROL XL) 5 MG 24 hr tablet, Take 10 mg by mouth 2 (two) times daily., Disp: , Rfl:    Homeopathic Products (LEG CRAMPS) TABS, Take by mouth as needed. Pt taking for leg cramps., Disp: , Rfl:    LEVEMIR 100 UNIT/ML injection, Inject 10 Units into the skin at bedtime., Disp: , Rfl:    lidocaine-prilocaine (EMLA) cream, Apply to affected area once, Disp: 30 g, Rfl: 3   lisinopril (PRINIVIL,ZESTRIL) 10 MG tablet, Take 10 mg by mouth daily., Disp: , Rfl:    loratadine (CLARITIN) 10 MG tablet, Take 10 mg by mouth daily as needed for allergies., Disp: , Rfl:    metFORMIN (GLUCOPHAGE-XR) 500 MG 24 hr tablet, Take 1,000 mg by mouth in the morning and at bedtime., Disp: , Rfl:    ondansetron (ZOFRAN) 8 MG tablet, Take 1 tablet (8 mg total) by mouth 2 (two) times daily as needed. Start on the third day after chemotherapy., Disp: 30 tablet, Rfl: 1  oxyCODONE (OXY IR/ROXICODONE) 5 MG immediate release tablet, Take 1 tablet (5 mg total) by mouth every 4 (four) hours as needed for severe pain., Disp: 30 tablet, Rfl: 0   pantoprazole (PROTONIX) 20 MG tablet, Take 1 tablet by mouth once daily, Disp: 30 tablet, Rfl: 0   simvastatin (ZOCOR) 20 MG tablet, Take 20 mg by mouth every evening., Disp: , Rfl:    sodium chloride (OCEAN) 0.65 % SOLN nasal spray, Place 1 spray into both nostrils as needed for congestion., Disp: , Rfl:    tetrahydrozoline-zinc (VISINE-AC) 0.05-0.25 % ophthalmic solution, Place 2 drops into both eyes 3 (three) times daily as needed (dry eyes)., Disp: , Rfl:    Triamcinolone Acetonide (NASACORT AQ NA), Place 2 sprays into both nostrils daily as needed (allergies)., Disp: , Rfl:    trolamine salicylate (ASPERCREME) 10 % cream, Apply 1 application topically as needed for muscle pain., Disp: , Rfl:    acidophilus (RISAQUAD) CAPS capsule, Take 1 capsule by mouth  every evening. (Patient not taking: Reported on 08/12/2021), Disp: , Rfl:    ibuprofen (ADVIL,MOTRIN) 200 MG tablet, Take 200-400 mg by mouth every 6 (six) hours as needed for moderate pain. (Patient not taking: Reported on 08/12/2021), Disp: , Rfl:    insulin glargine (LANTUS) 100 UNIT/ML injection, Inject into the skin. (Patient not taking: Reported on 09/02/2021), Disp: , Rfl:    LORazepam (ATIVAN) 0.5 MG tablet, Take 1 tablet (0.5 mg total) by mouth every 6 (six) hours as needed (Nausea or vomiting). (Patient not taking: Reported on 08/12/2021), Disp: 30 tablet, Rfl: 0   Multiple Vitamin (MULTIVITAMIN PO), Take 1 Package by mouth daily. (Patient not taking: Reported on 08/12/2021), Disp: , Rfl:    Testosterone 1.62 % GEL, Apply 2 Pump topically in the morning. Applied to shoulders/upper arms (Patient not taking: Reported on 08/12/2021), Disp: , Rfl:  No current facility-administered medications for this visit.  Facility-Administered Medications Ordered in Other Visits:    brentuximab vedotin (ADCETRIS) 120 mg in sodium chloride 0.9 % 100 mL chemo infusion, 120 mg, Intravenous, Once, Sindy Guadeloupe, MD   dacarbazine (DTIC) 890 mg in sodium chloride 0.9 % 250 mL chemo infusion, 375 mg/m2 (Treatment Plan Recorded), Intravenous, Once, Sindy Guadeloupe, MD, Last Rate: 339 mL/hr at 09/02/21 1206, 890 mg at 09/02/21 1206   heparin lock flush 100 UNIT/ML injection, , , ,    heparin lock flush 100 unit/mL, 500 Units, Intracatheter, Once PRN, Sindy Guadeloupe, MD   pegfilgrastim (NEULASTA ONPRO KIT) injection 6 mg, 6 mg, Subcutaneous, Once, Sindy Guadeloupe, MD  Physical exam:  Vitals:   09/02/21 0856  BP: 130/83  Pulse: 73  Resp: 16  Temp: (!) 97.1 F (36.2 C)  TempSrc: Tympanic  SpO2: 99%  Weight: 234 lb (106.1 kg)  Height: 5' 11" (1.803 m)   Physical Exam Constitutional:      General: He is not in acute distress. Cardiovascular:     Rate and Rhythm: Normal rate and regular rhythm.     Heart sounds:  Normal heart sounds.  Pulmonary:     Effort: Pulmonary effort is normal.     Breath sounds: Normal breath sounds.  Skin:    General: Skin is warm and dry.  Neurological:     Mental Status: He is alert and oriented to person, place, and time.     CMP Latest Ref Rng & Units 09/02/2021  Glucose 70 - 99 mg/dL 149(H)  BUN 6 - 20 mg/dL 20  Creatinine 0.61 - 1.24 mg/dL 0.83  Sodium 135 - 145 mmol/L 136  Potassium 3.5 - 5.1 mmol/L 4.0  Chloride 98 - 111 mmol/L 106  CO2 22 - 32 mmol/L 23  Calcium 8.9 - 10.3 mg/dL 9.0  Total Protein 6.5 - 8.1 g/dL 6.6  Total Bilirubin 0.3 - 1.2 mg/dL 0.5  Alkaline Phos 38 - 126 U/L 59  AST 15 - 41 U/L 21  ALT 0 - 44 U/L 29   CBC Latest Ref Rng & Units 09/02/2021  WBC 4.0 - 10.5 K/uL 9.9  Hemoglobin 13.0 - 17.0 g/dL 11.1(L)  Hematocrit 39.0 - 52.0 % 33.9(L)  Platelets 150 - 400 K/uL 301     Assessment and plan- Patient is a 61 y.o. male with history of stage III classical Hodgkin's lymphoma here for on treatment assessment prior to cycle 4-day 1 of BV AVD chemotherapy  Counts okay to proceed with cycle 4-day 1 of BV AVD chemotherapy today with on for Neulasta support.  I will see him back in 2 weeks for cycle 4-day 15.  Plan is to complete 6 cycles of chemotherapy.  Hyperglycemia: Better controlled after starting insulin.  Continue to monitor   Visit Diagnosis 1. Encounter for antineoplastic chemotherapy   2. Encounter for monoclonal antibody treatment for malignancy   3. 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 Randa Evens, MD, MPH [facility] [phone] 09/02/2021 12:39 PM

## 2021-09-16 ENCOUNTER — Inpatient Hospital Stay (HOSPITAL_BASED_OUTPATIENT_CLINIC_OR_DEPARTMENT_OTHER): Payer: BC Managed Care – PPO | Admitting: Oncology

## 2021-09-16 ENCOUNTER — Inpatient Hospital Stay: Payer: BC Managed Care – PPO

## 2021-09-16 ENCOUNTER — Inpatient Hospital Stay: Payer: BC Managed Care – PPO | Attending: Oncology

## 2021-09-16 ENCOUNTER — Other Ambulatory Visit: Payer: Self-pay

## 2021-09-16 VITALS — BP 156/84 | HR 75 | Temp 97.1°F | Resp 16 | Ht 71.0 in | Wt 238.0 lb

## 2021-09-16 DIAGNOSIS — Z5111 Encounter for antineoplastic chemotherapy: Secondary | ICD-10-CM | POA: Diagnosis present

## 2021-09-16 DIAGNOSIS — C8178 Other classical Hodgkin lymphoma, lymph nodes of multiple sites: Secondary | ICD-10-CM

## 2021-09-16 DIAGNOSIS — Z7984 Long term (current) use of oral hypoglycemic drugs: Secondary | ICD-10-CM | POA: Insufficient documentation

## 2021-09-16 DIAGNOSIS — Z794 Long term (current) use of insulin: Secondary | ICD-10-CM | POA: Insufficient documentation

## 2021-09-16 DIAGNOSIS — C8198 Hodgkin lymphoma, unspecified, lymph nodes of multiple sites: Secondary | ICD-10-CM | POA: Insufficient documentation

## 2021-09-16 DIAGNOSIS — E1142 Type 2 diabetes mellitus with diabetic polyneuropathy: Secondary | ICD-10-CM | POA: Insufficient documentation

## 2021-09-16 DIAGNOSIS — T451X5A Adverse effect of antineoplastic and immunosuppressive drugs, initial encounter: Secondary | ICD-10-CM

## 2021-09-16 DIAGNOSIS — C8173 Other classical Hodgkin lymphoma, intra-abdominal lymph nodes: Secondary | ICD-10-CM

## 2021-09-16 DIAGNOSIS — G620 Drug-induced polyneuropathy: Secondary | ICD-10-CM

## 2021-09-16 DIAGNOSIS — E1136 Type 2 diabetes mellitus with diabetic cataract: Secondary | ICD-10-CM | POA: Insufficient documentation

## 2021-09-16 DIAGNOSIS — Z95828 Presence of other vascular implants and grafts: Secondary | ICD-10-CM

## 2021-09-16 DIAGNOSIS — Z79899 Other long term (current) drug therapy: Secondary | ICD-10-CM | POA: Diagnosis not present

## 2021-09-16 LAB — COMPREHENSIVE METABOLIC PANEL
ALT: 28 U/L (ref 0–44)
AST: 22 U/L (ref 15–41)
Albumin: 4.2 g/dL (ref 3.5–5.0)
Alkaline Phosphatase: 70 U/L (ref 38–126)
Anion gap: 7 (ref 5–15)
BUN: 14 mg/dL (ref 6–20)
CO2: 24 mmol/L (ref 22–32)
Calcium: 8.9 mg/dL (ref 8.9–10.3)
Chloride: 103 mmol/L (ref 98–111)
Creatinine, Ser: 0.79 mg/dL (ref 0.61–1.24)
GFR, Estimated: >60 mL/min (ref >60)
Glucose, Bld: 154 mg/dL — ABNORMAL HIGH (ref 70–99)
Potassium: 3.7 mmol/L (ref 3.5–5.1)
Sodium: 134 mmol/L — ABNORMAL LOW (ref 135–145)
Total Bilirubin: 0.5 mg/dL (ref 0.3–1.2)
Total Protein: 6.8 g/dL (ref 6.5–8.1)

## 2021-09-16 LAB — CBC WITH DIFFERENTIAL/PLATELET
Abs Immature Granulocytes: 0.16 10*3/uL — ABNORMAL HIGH (ref 0.00–0.07)
Basophils Absolute: 0.1 10*3/uL (ref 0.0–0.1)
Basophils Relative: 1 %
Eosinophils Absolute: 0.1 10*3/uL (ref 0.0–0.5)
Eosinophils Relative: 1 %
HCT: 34.5 % — ABNORMAL LOW (ref 39.0–52.0)
Hemoglobin: 11.3 g/dL — ABNORMAL LOW (ref 13.0–17.0)
Immature Granulocytes: 2 %
Lymphocytes Relative: 14 %
Lymphs Abs: 1.2 10*3/uL (ref 0.7–4.0)
MCH: 28.4 pg (ref 26.0–34.0)
MCHC: 32.8 g/dL (ref 30.0–36.0)
MCV: 86.7 fL (ref 80.0–100.0)
Monocytes Absolute: 0.5 10*3/uL (ref 0.1–1.0)
Monocytes Relative: 6 %
Neutro Abs: 6.9 10*3/uL (ref 1.7–7.7)
Neutrophils Relative %: 76 %
Platelets: 232 10*3/uL (ref 150–400)
RBC: 3.98 MIL/uL — ABNORMAL LOW (ref 4.22–5.81)
RDW: 16.3 % — ABNORMAL HIGH (ref 11.5–15.5)
WBC: 9 10*3/uL (ref 4.0–10.5)
nRBC: 0 % (ref 0.0–0.2)

## 2021-09-16 MED ORDER — SODIUM CHLORIDE 0.9% FLUSH
10.0000 mL | Freq: Once | INTRAVENOUS | Status: AC
  Administered 2021-09-16: 10 mL via INTRAVENOUS
  Filled 2021-09-16: qty 10

## 2021-09-16 MED ORDER — HEPARIN SOD (PORK) LOCK FLUSH 100 UNIT/ML IV SOLN
500.0000 [IU] | Freq: Once | INTRAVENOUS | Status: AC | PRN
  Administered 2021-09-16: 500 [IU]
  Filled 2021-09-16: qty 5

## 2021-09-16 MED ORDER — VINBLASTINE SULFATE CHEMO INJECTION 1 MG/ML
3.0000 mg/m2 | Freq: Once | INTRAVENOUS | Status: AC
  Administered 2021-09-16: 7.1 mg via INTRAVENOUS
  Filled 2021-09-16: qty 7.1

## 2021-09-16 MED ORDER — SODIUM CHLORIDE 0.9 % IV SOLN
150.0000 mg | Freq: Once | INTRAVENOUS | Status: AC
  Administered 2021-09-16: 150 mg via INTRAVENOUS
  Filled 2021-09-16: qty 150

## 2021-09-16 MED ORDER — PEGFILGRASTIM 6 MG/0.6ML ~~LOC~~ PSKT
6.0000 mg | PREFILLED_SYRINGE | Freq: Once | SUBCUTANEOUS | Status: AC
  Administered 2021-09-16: 6 mg via SUBCUTANEOUS
  Filled 2021-09-16: qty 0.6

## 2021-09-16 MED ORDER — SODIUM CHLORIDE 0.9 % IV SOLN: 4.0000 mg | Freq: Once | INTRAVENOUS | Status: DC

## 2021-09-16 MED ORDER — ACETAMINOPHEN 325 MG PO TABS
650.0000 mg | ORAL_TABLET | Freq: Once | ORAL | Status: AC
  Administered 2021-09-16: 650 mg via ORAL
  Filled 2021-09-16: qty 2

## 2021-09-16 MED ORDER — DEXAMETHASONE SODIUM PHOSPHATE 10 MG/ML IJ SOLN
4.0000 mg | Freq: Once | INTRAMUSCULAR | Status: AC
  Administered 2021-09-16: 4 mg via INTRAVENOUS
  Filled 2021-09-16: qty 1

## 2021-09-16 MED ORDER — DIPHENHYDRAMINE HCL 50 MG/ML IJ SOLN
50.0000 mg | Freq: Once | INTRAMUSCULAR | Status: AC
  Administered 2021-09-16: 50 mg via INTRAVENOUS
  Filled 2021-09-16: qty 1

## 2021-09-16 MED ORDER — DOXORUBICIN HCL CHEMO IV INJECTION 2 MG/ML
25.0000 mg/m2 | Freq: Once | INTRAVENOUS | Status: AC
  Administered 2021-09-16: 60 mg via INTRAVENOUS
  Filled 2021-09-16: qty 30

## 2021-09-16 MED ORDER — SODIUM CHLORIDE 0.9 % IV SOLN
Freq: Once | INTRAVENOUS | Status: AC
  Filled 2021-09-16: qty 250

## 2021-09-16 MED ORDER — PALONOSETRON HCL INJECTION 0.25 MG/5ML
0.2500 mg | Freq: Once | INTRAVENOUS | Status: AC
  Administered 2021-09-16: 0.25 mg via INTRAVENOUS
  Filled 2021-09-16: qty 5

## 2021-09-16 MED ORDER — SODIUM CHLORIDE 0.9 % IV SOLN
120.0000 mg | Freq: Once | INTRAVENOUS | Status: AC
  Administered 2021-09-16: 120 mg via INTRAVENOUS
  Filled 2021-09-16: qty 24

## 2021-09-16 MED ORDER — SODIUM CHLORIDE 0.9 % IV SOLN
375.0000 mg/m2 | Freq: Once | INTRAVENOUS | Status: AC
  Administered 2021-09-16: 890 mg via INTRAVENOUS
  Filled 2021-09-16: qty 89

## 2021-09-16 NOTE — Patient Instructions (Signed)
[facility] [place]  Discharge Instructions: Thank you for choosing [facility] to provide your oncology and hematology care.  If you have a lab appointment with the [facility], please go directly to the [place] and check in at the registration area.  Wear comfortable clothing and clothing appropriate for easy access to any Portacath or PICC line.   We strive to give you quality time with your provider. You may need to reschedule your appointment if you arrive late (15 or more minutes).  Arriving late affects you and other patients whose appointments are after yours.  Also, if you miss three or more appointments without notifying the office, you may be dismissed from the clinic at the providers discretion.      For prescription refill requests, have your pharmacy contact our office and allow 72 hours for refills to be completed.    Today you received the following chemotherapy and/or immunotherapy agents 5 FU, VELBAN, DTIC, ADCETRIS, NEULASTA   To help prevent nausea and vomiting after your treatment, we encourage you to take your nausea medication as directed.  BELOW ARE SYMPTOMS THAT SHOULD BE REPORTED IMMEDIATELY: *FEVER GREATER THAN 100.4 F (38 C) OR HIGHER *CHILLS OR SWEATING *NAUSEA AND VOMITING THAT IS NOT CONTROLLED WITH YOUR NAUSEA MEDICATION *UNUSUAL SHORTNESS OF BREATH *UNUSUAL BRUISING OR BLEEDING *URINARY PROBLEMS (pain or burning when urinating, or frequent urination) *BOWEL PROBLEMS (unusual diarrhea, constipation, pain near the anus) TENDERNESS IN MOUTH AND THROAT WITH OR WITHOUT PRESENCE OF ULCERS (sore throat, sores in mouth, or a toothache) UNUSUAL RASH, SWELLING OR PAIN  UNUSUAL VAGINAL DISCHARGE OR ITCHING   Items with * indicate a potential emergency and should be followed up as soon as possible or go to the Emergency Department if any problems should occur.  Please show the CHEMOTHERAPY ALERT CARD or IMMUNOTHERAPY  ALERT CARD at check-in to the Emergency Department and triage nurse.  Should you have questions after your visit or need to cancel or reschedule your appointment, please contact [facility] [place] [facility] [place]  [phone] and follow the prompts.  Office hours are 8:00 a.m. to 4:30 p.m. Monday - Friday. Please note that voicemails left after 4:00 p.m. may not be returned until the following business day.  We are closed weekends and major holidays. You have access to a nurse at all times for urgent questions. Please call the main number to the clinic [phone] and follow the prompts.  For any non-urgent questions, you may also contact your provider using MyChart. We now offer e-Visits for anyone 68 and older to request care online for non-urgent symptoms. For details visit [email or web address].   Also download the MyChart app! Go to the app store, search "MyChart", open the app, select [place], and log in with your MyChart username and password.  Due to Covid, a mask is required upon entering the hospital/clinic. If you do not have a mask, one will be given to you upon arrival. For doctor visits, patients may have 1 support person aged 73 or older with them. For treatment visits, patients cannot have anyone with them due to current Covid guidelines and our immunocompromised population.   Fluorouracil, 5-FU injection What is this medication? FLUOROURACIL, 5-FU (flure oh YOOR a sil) is a chemotherapy drug. It slows the growth of cancer cells. This medicine is used to treat many types of cancer like breast cancer, colon or rectal cancer, pancreatic cancer, and stomach cancer. This medicine may be used for  other purposes; ask your health care provider or pharmacist if you have questions. COMMON BRAND NAME(S): Adrucil What should I tell my care team before I take this medication? They need to know if you have any of these conditions: blood disorders dihydropyrimidine dehydrogenase  (DPD) deficiency infection (especially a virus infection such as chickenpox, cold sores, or herpes) kidney disease liver disease malnourished, poor nutrition recent or ongoing radiation therapy an unusual or allergic reaction to fluorouracil, other chemotherapy, other medicines, foods, dyes, or preservatives pregnant or trying to get pregnant breast-feeding How should I use this medication? This drug is given as an infusion or injection into a vein. It is administered in a hospital or clinic by a specially trained health care professional. Talk to your pediatrician regarding the use of this medicine in children. Special care may be needed. Overdosage: If you think you have taken too much of this medicine contact a poison control center or emergency room at once. NOTE: This medicine is only for you. Do not share this medicine with others. What if I miss a dose? It is important not to miss your dose. Call your doctor or health care professional if you are unable to keep an appointment. What may interact with this medication? Do not take this medicine with any of the following medications: live virus vaccines This medicine may also interact with the following medications: medicines that treat or prevent blood clots like warfarin, enoxaparin, and dalteparin This list may not describe all possible interactions. Give your health care provider a list of all the medicines, herbs, non-prescription drugs, or dietary supplements you use. Also tell them if you smoke, drink alcohol, or use illegal drugs. Some items may interact with your medicine. What should I watch for while using this medication? Visit your doctor for checks on your progress. This drug may make you feel generally unwell. This is not uncommon, as chemotherapy can affect healthy cells as well as cancer cells. Report any side effects. Continue your course of treatment even though you feel ill unless your doctor tells you to stop. In some  cases, you may be given additional medicines to help with side effects. Follow all directions for their use. Call your doctor or health care professional for advice if you get a fever, chills or sore throat, or other symptoms of a cold or flu. Do not treat yourself. This drug decreases your body's ability to fight infections. Try to avoid being around people who are sick. This medicine may increase your risk to bruise or bleed. Call your doctor or health care professional if you notice any unusual bleeding. Be careful brushing and flossing your teeth or using a toothpick because you may get an infection or bleed more easily. If you have any dental work done, tell your dentist you are receiving this medicine. Avoid taking products that contain aspirin, acetaminophen, ibuprofen, naproxen, or ketoprofen unless instructed by your doctor. These medicines may hide a fever. Do not become pregnant while taking this medicine. Women should inform their doctor if they wish to become pregnant or think they might be pregnant. There is a potential for serious side effects to an unborn child. Talk to your health care professional or pharmacist for more information. Do not breast-feed an infant while taking this medicine. Men should inform their doctor if they wish to father a child. This medicine may lower sperm counts. Do not treat diarrhea with over the counter products. Contact your doctor if you have diarrhea that lasts more  than 2 days or if it is severe and watery. This medicine can make you more sensitive to the sun. Keep out of the sun. If you cannot avoid being in the sun, wear protective clothing and use sunscreen. Do not use sun lamps or tanning beds/booths. What side effects may I notice from receiving this medication? Side effects that you should report to your doctor or health care professional as soon as possible: allergic reactions like skin rash, itching or hives, swelling of the face, lips, or  tongue low blood counts - this medicine may decrease the number of white blood cells, red blood cells and platelets. You may be at increased risk for infections and bleeding. signs of infection - fever or chills, cough, sore throat, pain or difficulty passing urine signs of decreased platelets or bleeding - bruising, pinpoint red spots on the skin, black, tarry stools, blood in the urine signs of decreased red blood cells - unusually weak or tired, fainting spells, lightheadedness breathing problems changes in vision chest pain mouth sores nausea and vomiting pain, swelling, redness at site where injected pain, tingling, numbness in the hands or feet redness, swelling, or sores on hands or feet stomach pain unusual bleeding Side effects that usually do not require medical attention (report to your doctor or health care professional if they continue or are bothersome): changes in finger or toe nails diarrhea dry or itchy skin hair loss headache loss of appetite sensitivity of eyes to the light stomach upset unusually teary eyes This list may not describe all possible side effects. Call your doctor for medical advice about side effects. You may report side effects to FDA at 1-800-FDA-1088. Where should I keep my medication? This drug is given in a hospital or clinic and will not be stored at home. NOTE: This sheet is a summary. It may not cover all possible information. If you have questions about this medicine, talk to your doctor, pharmacist, or health care provider.  2022 Elsevier/Gold Standard (2021-03-17 00:00:00)  Vinblastine injection What is this medication? VINBLASTINE (vin BLAS teen) is a chemotherapy drug. It slows the growth of cancer cells. This medicine is used to treat many types of cancer like breast cancer, testicular cancer, Hodgkin's disease, non-Hodgkin's lymphoma, and sarcoma. This medicine may be used for other purposes; ask your health care provider or  pharmacist if you have questions. COMMON BRAND NAME(S): Velban What should I tell my care team before I take this medication? They need to know if you have any of these conditions: blood disorders dental disease gout infection (especially a virus infection such as chickenpox, cold sores, or herpes) liver disease lung disease nervous system disease recent or ongoing radiation therapy an unusual or allergic reaction to vinblastine, other chemotherapy agents, other medicines, foods, dyes, or preservatives pregnant or trying to get pregnant breast-feeding How should I use this medication? This drug is given as an infusion into a vein. It is administered in a hospital or clinic by a specially trained health care professional. If you have pain, swelling, burning or any unusual feeling around the site of your injection, tell your health care professional right away. Talk to your pediatrician regarding the use of this medicine in children. While this drug may be prescribed for selected conditions, precautions do apply. Overdosage: If you think you have taken too much of this medicine contact a poison control center or emergency room at once. NOTE: This medicine is only for you. Do not share this medicine with others. What  if I miss a dose? It is important not to miss your dose. Call your doctor or health care professional if you are unable to keep an appointment. What may interact with this medication? erythromycin certain medicines for fungal infections like itraconazole, ketoconazole, posaconazole, voriconazole certain medicines for seizures like phenytoin This list may not describe all possible interactions. Give your health care provider a list of all the medicines, herbs, non-prescription drugs, or dietary supplements you use. Also tell them if you smoke, drink alcohol, or use illegal drugs. Some items may interact with your medicine. What should I watch for while using this medication? Your  condition will be monitored carefully while you are receiving this medicine. You will need important blood work done while you are taking this medicine. This drug may make you feel generally unwell. This is not uncommon, as chemotherapy can affect healthy cells as well as cancer cells. Report any side effects. Continue your course of treatment even though you feel ill unless your doctor tells you to stop. In some cases, you may be given additional medicines to help with side effects. Follow all directions for their use. Call your doctor or health care professional for advice if you get a fever, chills or sore throat, or other symptoms of a cold or flu. Do not treat yourself. This drug decreases your body's ability to fight infections. Try to avoid being around people who are sick. This medicine may increase your risk to bruise or bleed. Call your doctor or health care professional if you notice any unusual bleeding. Be careful brushing and flossing your teeth or using a toothpick because you may get an infection or bleed more easily. If you have any dental work done, tell your dentist you are receiving this medicine. Avoid taking products that contain aspirin, acetaminophen, ibuprofen, naproxen, or ketoprofen unless instructed by your doctor. These medicines may hide a fever. Do not become pregnant while taking this medicine. Women should inform their doctor if they wish to become pregnant or think they might be pregnant. There is a potential for serious side effects to an unborn child. Talk to your health care professional or pharmacist for more information. Do not breast-feed an infant while taking this medicine. Men may have a lower sperm count while taking this medicine. Talk to your doctor if you plan to father a child. What side effects may I notice from receiving this medication? Side effects that you should report to your doctor or health care professional as soon as possible: allergic reactions  like skin rash, itching or hives, swelling of the face, lips, or tongue low blood counts - This drug may decrease the number of white blood cells, red blood cells and platelets. You may be at increased risk for infections and bleeding. signs of infection - fever or chills, cough, sore throat, pain or difficulty passing urine signs of decreased platelets or bleeding - bruising, pinpoint red spots on the skin, black, tarry stools, nosebleeds signs of decreased red blood cells - unusually weak or tired, fainting spells, lightheadedness breathing problems changes in hearing change in the amount of urine chest pain high blood pressure mouth sores nausea and vomiting pain, swelling, redness or irritation at the injection site pain, tingling, numbness in the hands or feet problems with balance, dizziness seizures Side effects that usually do not require medical attention (report to your doctor or health care professional if they continue or are bothersome): constipation hair loss jaw pain loss of appetite sensitivity to light  stomach pain tumor pain This list may not describe all possible side effects. Call your doctor for medical advice about side effects. You may report side effects to FDA at 1-800-FDA-1088. Where should I keep my medication? This drug is given in a hospital or clinic and will not be stored at home. NOTE: This sheet is a summary. It may not cover all possible information. If you have questions about this medicine, talk to your doctor, pharmacist, or health care provider.  2022 Elsevier/Gold Standard (2021-03-17 00:00:00)  Dacarbazine, DTIC injection What is this medication? DACARBAZINE (da KAR ba zeen) is a chemotherapy drug. This medicine is used to treat skin cancer. It is also used with other medicines to treat Hodgkin's disease. This medicine may be used for other purposes; ask your health care provider or pharmacist if you have questions. COMMON BRAND NAME(S):  DTIC-Dome What should I tell my care team before I take this medication? They need to know if you have any of these conditions: infection (especially virus infection such as chickenpox, cold sores, or herpes) kidney disease liver disease low blood counts like low platelets, red blood cells, white blood cells recent radiation therapy an unusual or allergic reaction to dacarbazine, other chemotherapy agents, other medicines, foods, dyes, or preservatives pregnant or trying to get pregnant breast-feeding How should I use this medication? This drug is given as an injection or infusion into a vein. It is administered in a hospital or clinic by a specially trained health care professional. Talk to your pediatrician regarding the use of this medicine in children. While this drug may be prescribed for selected conditions, precautions do apply. Overdosage: If you think you have taken too much of this medicine contact a poison control center or emergency room at once. NOTE: This medicine is only for you. Do not share this medicine with others. What if I miss a dose? It is important not to miss your dose. Call your doctor or health care professional if you are unable to keep an appointment. What may interact with this medication? medicines to increase blood counts like filgrastim, pegfilgrastim, sargramostim vaccines This list may not describe all possible interactions. Give your health care provider a list of all the medicines, herbs, non-prescription drugs, or dietary supplements you use. Also tell them if you smoke, drink alcohol, or use illegal drugs. Some items may interact with your medicine. What should I watch for while using this medication? Your condition will be monitored carefully while you are receiving this medicine. You will need important blood work done while you are taking this medicine. This drug may make you feel generally unwell. This is not uncommon, as chemotherapy can affect  healthy cells as well as cancer cells. Report any side effects. Continue your course of treatment even though you feel ill unless your doctor tells you to stop. Call your doctor or health care professional for advice if you get a fever, chills or sore throat, or other symptoms of a cold or flu. Do not treat yourself. This drug decreases your body's ability to fight infections. Try to avoid being around people who are sick. This medicine may increase your risk to bruise or bleed. Call your doctor or health care professional if you notice any unusual bleeding. Talk to your doctor about your risk of cancer. You may be more at risk for certain types of cancers if you take this medicine. Do not become pregnant while taking this medicine. Women should inform their doctor if they wish to  become pregnant or think they might be pregnant. There is a potential for serious side effects to an unborn child. Talk to your health care professional or pharmacist for more information. Do not breast-feed an infant while taking this medicine. What side effects may I notice from receiving this medication? Side effects that you should report to your doctor or health care professional as soon as possible: allergic reactions like skin rash, itching or hives, swelling of the face, lips, or tongue low blood counts - this medicine may decrease the number of white blood cells, red blood cells and platelets. You may be at increased risk for infections and bleeding. signs of infection - fever or chills, cough, sore throat, pain or difficulty passing urine signs of decreased platelets or bleeding - bruising, pinpoint red spots on the skin, black, tarry stools, blood in the urine signs of decreased red blood cells - unusually weak or tired, fainting spells, lightheadedness breathing problems muscle pains pain at site where injected trouble passing urine or change in the amount of urine vomiting yellowing of the eyes or skin Side  effects that usually do not require medical attention (report to your doctor or health care professional if they continue or are bothersome): diarrhea hair loss loss of appetite nausea skin more sensitive to sun or ultraviolet light stomach upset This list may not describe all possible side effects. Call your doctor for medical advice about side effects. You may report side effects to FDA at 1-800-FDA-1088. Where should I keep my medication? This drug is given in a hospital or clinic and will not be stored at home. NOTE: This sheet is a summary. It may not cover all possible information. If you have questions about this medicine, talk to your doctor, pharmacist, or health care provider.  2022 Elsevier/Gold Standard (2016-01-14 00:00:00)  Brentuximab vedotin solution for injection What is this medication? BRENTUXIMAB VEDOTIN (bren TUX see mab ve DOE tin) is a monoclonal antibody and a chemotherapy drug. It is used for treating Hodgkin lymphoma and certain non-Hodgkin lymphomas, such as anaplastic large-cell lymphoma, mycosis fungoides, and peripheral T-cell lymphoma. This medicine may be used for other purposes; ask your health care provider or pharmacist if you have questions. COMMON BRAND NAME(S): ADCETRIS What should I tell my care team before I take this medication? They need to know if you have any of these conditions: immune system problems infection (especially a virus infection such as chickenpox, cold sores, or herpes) kidney disease liver disease low blood counts, like low white cell, platelet, or red cell counts tingling of the fingers or toes, or other nerve disorder an unusual or allergic reaction to brentuximab vedotin, other medicines, foods, dyes, or preservatives pregnant or trying to get pregnant breast-feeding How should I use this medication? This medicine is for infusion into a vein. It is given by a health care professional in a hospital or clinic setting. Talk to  your pediatrician regarding the use of this medicine in children. Special care may be needed. Overdosage: If you think you have taken too much of this medicine contact a poison control center or emergency room at once. NOTE: This medicine is only for you. Do not share this medicine with others. What if I miss a dose? It is important not to miss your dose. Call your doctor or health care professional if you are unable to keep an appointment. What may interact with this medication? Do not take this medicine with any of the following medications: bleomycin This medicine  may also interact with the following medications: ketoconazole rifampin St. John's wort; Hypericum perforatum This list may not describe all possible interactions. Give your health care provider a list of all the medicines, herbs, non-prescription drugs, or dietary supplements you use. Also tell them if you smoke, drink alcohol, or use illegal drugs. Some items may interact with your medicine. What should I watch for while using this medication? Visit your doctor for checks on your progress. This drug may make you feel generally unwell. Report any side effects. Continue your course of treatment even though you feel ill unless your doctor tells you to stop. Call your doctor or health care professional for advice if you get a fever, chills or sore throat, or other symptoms of a cold or flu. Do not treat yourself. This drug decreases your body's ability to fight infections. Try to avoid being around people who are sick. This medicine may increase your risk to bruise or bleed. Call your doctor or health care professional if you notice any unusual bleeding. In some patients, this medicine may cause a serious brain infection that may cause death. If you have any problems seeing, thinking, speaking, walking, or standing, tell your doctor right away. If you cannot reach your doctor, urgently seek other source of medical care. Do not become  pregnant while taking this medicine or for 6 months after stopping it. Women should inform their doctor if they wish to become pregnant or think they might be pregnant. Men should not father a child while taking this medicine and for 6 months after stopping it. There is a potential for serious side effects to an unborn child. Talk to your health care professional or pharmacist for more information. Do not breast-feed an infant while taking this medicine. This may interfere with the ability to father a child. You should talk to your doctor or health care professional if you are concerned about your fertility. What side effects may I notice from receiving this medication? Side effects that you should report to your doctor or health care professional as soon as possible: allergic reactions like skin rash, itching or hives, swelling of the face, lips, or tongue changes in emotions or moods diarrhea low blood counts - this medicine may decrease the number of white blood cells, red blood cells and platelets. You may be at increased risk for infections and bleeding. pain, tingling, numbness in the hands or feet redness, blistering, peeling or loosening of the skin, including inside the mouth shortness of breath signs of infection - fever or chills, cough, sore throat, pain or difficulty passing urine signs of decreased platelets or bleeding - bruising, pinpoint red spots on the skin, black, tarry stools, blood in the urine signs of decreased red blood cells - unusually weak or tired, fainting spells, lightheadedness signs of liver injury like dark yellow or brown urine; general ill feeling or flu-like symptoms; light-colored stools; loss of appetite; nausea; right upper belly pain; yellowing of the eyes or skin stomach pain sudden numbness or weakness of the face, arm or leg vomiting Side effects that usually do not require medical attention (report to your doctor or health care professional if they  continue or are bothersome): constipation dizziness headache muscle pain tiredness This list may not describe all possible side effects. Call your doctor for medical advice about side effects. You may report side effects to FDA at 1-800-FDA-1088. Where should I keep my medication? This drug is given in a hospital or clinic and will not  be stored at home. NOTE: This sheet is a summary. It may not cover all possible information. If you have questions about this medicine, talk to your doctor, pharmacist, or health care provider.  2022 Elsevier/Gold Standard (2021-03-17 00:00:00)  Pegfilgrastim Injection What is this medication? PEGFILGRASTIM (PEG fil gra stim) lowers the risk of infection in people who are receiving chemotherapy. It works by Building control surveyor make more white blood cells, which protects your body from infection. It may also be used to help people who have been exposed to high doses of radiation. This medicine may be used for other purposes; ask your health care provider or pharmacist if you have questions. COMMON BRAND NAME(S): Rexene Edison, Ziextenzo What should I tell my care team before I take this medication? They need to know if you have any of these conditions: Kidney disease Latex allergy Ongoing radiation therapy Sickle cell disease Skin reactions to acrylic adhesives (On-Body Injector only) An unusual or allergic reaction to pegfilgrastim, filgrastim, other medications, foods, dyes, or preservatives Pregnant or trying to get pregnant Breast-feeding How should I use this medication? This medication is for injection under the skin. If you get this medication at home, you will be taught how to prepare and give the pre-filled syringe or how to use the On-body Injector. Refer to the patient Instructions for Use for detailed instructions. Use exactly as directed. Tell your care team immediately if you suspect that the On-body Injector may not have  performed as intended or if you suspect the use of the On-body Injector resulted in a missed or partial dose. It is important that you put your used needles and syringes in a special sharps container. Do not put them in a trash can. If you do not have a sharps container, call your pharmacist or care team to get one. Talk to your care team about the use of this medication in children. While this medication may be prescribed for selected conditions, precautions do apply. Overdosage: If you think you have taken too much of this medicine contact a poison control center or emergency room at once. NOTE: This medicine is only for you. Do not share this medicine with others. What if I miss a dose? It is important not to miss your dose. Call your care team if you miss your dose. If you miss a dose due to an On-body Injector failure or leakage, a new dose should be administered as soon as possible using a single prefilled syringe for manual use. What may interact with this medication? Interactions have not been studied. This list may not describe all possible interactions. Give your health care provider a list of all the medicines, herbs, non-prescription drugs, or dietary supplements you use. Also tell them if you smoke, drink alcohol, or use illegal drugs. Some items may interact with your medicine. What should I watch for while using this medication? Your condition will be monitored carefully while you are receiving this medication. You may need blood work done while you are taking this medication. Talk to your care team about your risk of cancer. You may be more at risk for certain types of cancer if you take this medication. If you are going to need a MRI, CT scan, or other procedure, tell your care team that you are using this medication (On-Body Injector only). What side effects may I notice from receiving this medication? Side effects that you should report to your care team as soon as  possible: Allergic reactions--skin  rash, itching, hives, swelling of the face, lips, tongue, or throat Capillary leak syndrome--stomach or muscle pain, unusual weakness or fatigue, feeling faint or lightheaded, decrease in the amount of urine, swelling of the ankles, hands, or feet, trouble breathing High white blood cell level--fever, fatigue, trouble breathing, night sweats, change in vision, weight loss Inflammation of the aorta--fever, fatigue, back, chest, or stomach pain, severe headache Kidney injury (glomerulonephritis)--decrease in the amount of urine, red or dark brown urine, foamy or bubbly urine, swelling of the ankles, hands, or feet Shortness of breath or trouble breathing Spleen injury--pain in upper left stomach or shoulder Unusual bruising or bleeding Side effects that usually do not require medical attention (report to your care team if they continue or are bothersome): Bone pain Pain in the hands or feet This list may not describe all possible side effects. Call your doctor for medical advice about side effects. You may report side effects to FDA at 1-800-FDA-1088. Where should I keep my medication? Keep out of the reach of children. If you are using this medication at home, you will be instructed on how to store it. Throw away any unused medication after the expiration date on the label. NOTE: This sheet is a summary. It may not cover all possible information. If you have questions about this medicine, talk to your doctor, pharmacist, or health care provider.  2022 Elsevier/Gold Standard (2021-03-17 00:00:00)

## 2021-09-16 NOTE — Progress Notes (Signed)
Hematology/Oncology Consult note [facility]  [phone] [phone]  Patient Care Team: Maryland Pink, MD as PCP - General (Family Medicine) Sindy Guadeloupe, MD as Consulting Physician (Oncology)   Name of the patient: Erik Buchanan  [id number]  12-20-1960   Date of visit: 09/16/21  Diagnosis-  stage III classical Hodgkin's lymphoma  Chief complaint/ Reason for visit-on treatment assessment prior to cycle 4-day 15 of BV AVD chemotherapy  Heme/Onc history: patient is a 61 year old male with a past medical history significant for hypertension and diabetes among other medical problems.  He was noted to have left-sided neck swelling which has been ongoing for the last 4 months but he feels that the swelling has slowly increased in size.  He was seen by Dr. Kary Kos and underwent CT chest and CT soft tissue neck after initial ultrasound of the neck.  CT showed bulky supraclavicular adenopathy with the largest node measuring 4.1 cm CT chest with contrast also showed similar findings and no evidence of right supraclavicular axillary mediastinal or hilar adenopathy.  Lungs were clear.  Multiple hypodense lesions in the spleen which nearly replaces the splenic parenchyma.  Spleen however does not appear enlarged.    PET CT scan showed FDG avid adenopathy within the lower left neck to left supraclavicular region as well as small bowel mesentery peritoneum and porta hepatic lesions.  Multiple FDG avid splenic lesions.  Mild diffuse increased uptake in the bone marrow nonspecific.  No focal areas of increased uptake to suggest bone metastases.   Excisional supraclavicular lymph node biopsy showed classical Hodgkin's lymphoma. The H&E stained sections reveal lymph node effaced by a somewhat nodular proliferation of large, abnormal lymphoid cells, including "mummified" cells, "lacunar" cells, and occasional binucleated cells with classic Reed-Sternberg  morphology, in a background of predominantly small lymphocytes.  EBV negative   Stage III classical Hodgkin's lymphoma IPI score 2.5-year freedom from progression of 80% and 91% overall survival.    Interval history-reports that his peripheral neuropathy in his hands is now more persistent.  It does not still bother him to the extent that it interferes with work or ADLs.  Reports ongoing fatigue.  Denies other complaints at this time  ECOG PS- 0 Pain scale- 0   Review of systems- Review of Systems  Constitutional:  Positive for malaise/fatigue. Negative for chills, fever and weight loss.  HENT:  Negative for congestion, ear discharge and nosebleeds.   Eyes:  Negative for blurred vision.  Respiratory:  Negative for cough, hemoptysis, sputum production, shortness of breath and wheezing.   Cardiovascular:  Negative for chest pain, palpitations, orthopnea and claudication.  Gastrointestinal:  Negative for abdominal pain, blood in stool, constipation, diarrhea, heartburn, melena, nausea and vomiting.  Genitourinary:  Negative for dysuria, flank pain, frequency, hematuria and urgency.  Musculoskeletal:  Negative for back pain, joint pain and myalgias.  Skin:  Negative for rash.  Neurological:  Positive for sensory change (Peripheral neuropathy). Negative for dizziness, tingling, focal weakness, seizures, weakness and headaches.  Endo/Heme/Allergies:  Does not bruise/bleed easily.  Psychiatric/Behavioral:  Negative for depression and suicidal ideas. The patient does not have insomnia.       Allergies  Allergen Reactions   Ultram [Tramadol Hcl] Other (See Comments)    vertigo and passed out     Past Medical History:  Diagnosis Date   Anemia    Colon polyps    Depression    Diabetes mellitus without complication (HCC)    GERD (gastroesophageal  reflux disease)    Hemorrhoids    Hodgkin's lymphoma ([place])    Hyperlipidemia    Hypertension    Pneumonia    Testicular hypofunction     Tinnitus      Past Surgical History:  Procedure Laterality Date   COLONOSCOPY WITH PROPOFOL N/A 04/07/2015   Procedure: COLONOSCOPY WITH PROPOFOL;  Surgeon: Hulen Luster, MD;  Location: [facility] ENDOSCOPY;  Service: Gastroenterology;  Laterality: N/A;   COLONOSCOPY WITH PROPOFOL N/A 09/01/2020   Procedure: COLONOSCOPY WITH PROPOFOL;  Surgeon: Toledo, Benay Pike, MD;  Location: [facility] ENDOSCOPY;  Service: Gastroenterology;  Laterality: N/A;   EYE SURGERY     cataract   MASS BIOPSY Left 05/07/2021   Procedure: NECK MASS BIOPSY,;  Surgeon: Jules Husbands, MD;  Location: [facility] ORS;  Service: General;  Laterality: Left;  Provider requesting 1.5 hours / 90 minutes for procedure.   PORTACATH PLACEMENT Right 05/21/2021   Procedure: INSERTION PORT-A-CATH;  Surgeon: Jules Husbands, MD;  Location: [facility] ORS;  Service: General;  Laterality: Right;    Social History   Socioeconomic History   Marital status: Married    Spouse name: [name]   Number of children: Not on file   Years of education: Not on file   Highest education level: Not on file  Occupational History   Not on file  Tobacco Use   Smoking status: Never   Smokeless tobacco: Never  Vaping Use   Vaping Use: Never used  Substance and Sexual Activity   Alcohol use: Never   Drug use: Never   Sexual activity: Yes  Other Topics Concern   Not on file  Social History Narrative   Not on file   Social Determinants of Health   Financial Resource Strain: Not on file  Food Insecurity: Not on file  Transportation Needs: Not on file  Physical Activity: Not on file  Stress: Not on file  Social Connections: Not on file  Intimate Partner Violence: Not on file    Family History  Problem Relation Age of Onset   Arthritis Mother    Stroke Paternal Aunt      Current Outpatient Medications:    amLODipine (NORVASC) 5 MG tablet, Take 7.5 mg by mouth daily., Disp: , Rfl:    buPROPion (WELLBUTRIN XL) 150 MG 24 hr tablet, Take 450 mg by mouth  daily., Disp: , Rfl:    dexamethasone (DECADRON) 4 MG tablet, Take 2 tablets by mouth once a day for 3 days after chemo. Take with food., Disp: 30 tablet, Rfl: 1   glipiZIDE (GLUCOTROL XL) 5 MG 24 hr tablet, Take 10 mg by mouth 2 (two) times daily., Disp: , Rfl:    Homeopathic Products (LEG CRAMPS) TABS, Take by mouth as needed. Pt taking for leg cramps., Disp: , Rfl:    LEVEMIR 100 UNIT/ML injection, Inject 15 Units into the skin every evening., Disp: , Rfl:    lidocaine-prilocaine (EMLA) cream, Apply to affected area once, Disp: 30 g, Rfl: 3   lisinopril (PRINIVIL,ZESTRIL) 10 MG tablet, Take 10 mg by mouth daily., Disp: , Rfl:    loratadine (CLARITIN) 10 MG tablet, Take 10 mg by mouth daily as needed for allergies., Disp: , Rfl:    LORazepam (ATIVAN) 0.5 MG tablet, Take 1 tablet (0.5 mg total) by mouth every 6 (six) hours as needed (Nausea or vomiting)., Disp: 30 tablet, Rfl: 0   metFORMIN (GLUCOPHAGE-XR) 500 MG 24 hr tablet, Take 1,000 mg by mouth in the morning and at bedtime.,  Disp: , Rfl:    ondansetron (ZOFRAN) 8 MG tablet, Take 1 tablet (8 mg total) by mouth 2 (two) times daily as needed. Start on the third day after chemotherapy., Disp: 30 tablet, Rfl: 1   oxyCODONE (OXY IR/ROXICODONE) 5 MG immediate release tablet, Take 1 tablet (5 mg total) by mouth every 4 (four) hours as needed for severe pain., Disp: 30 tablet, Rfl: 0   pantoprazole (PROTONIX) 20 MG tablet, Take 1 tablet by mouth once daily (Patient taking differently: 20 mg as needed.), Disp: 30 tablet, Rfl: 0   simvastatin (ZOCOR) 20 MG tablet, Take 20 mg by mouth every evening., Disp: , Rfl:    sodium chloride (OCEAN) 0.65 % SOLN nasal spray, Place 1 spray into both nostrils as needed for congestion., Disp: , Rfl:    tetrahydrozoline-zinc (VISINE-AC) 0.05-0.25 % ophthalmic solution, Place 2 drops into both eyes 3 (three) times daily as needed (dry eyes)., Disp: , Rfl:    Triamcinolone Acetonide (NASACORT AQ NA), Place 2 sprays into  both nostrils daily as needed (allergies)., Disp: , Rfl:    trolamine salicylate (ASPERCREME) 10 % cream, Apply 1 application topically as needed for muscle pain., Disp: , Rfl:    ibuprofen (ADVIL,MOTRIN) 200 MG tablet, Take 200-400 mg by mouth every 6 (six) hours as needed for moderate pain. (Patient not taking: Reported on 08/12/2021), Disp: , Rfl:    insulin glargine (LANTUS) 100 UNIT/ML injection, Inject into the skin. (Patient not taking: Reported on 09/02/2021), Disp: , Rfl:    Multiple Vitamin (MULTIVITAMIN PO), Take 1 Package by mouth daily. (Patient not taking: Reported on 08/12/2021), Disp: , Rfl:    Testosterone 1.62 % GEL, Apply 2 Pump topically in the morning. Applied to shoulders/upper arms (Patient not taking: Reported on 08/12/2021), Disp: , Rfl:  No current facility-administered medications for this visit.  Facility-Administered Medications Ordered in Other Visits:    brentuximab vedotin (ADCETRIS) 120 mg in sodium chloride 0.9 % 100 mL chemo infusion, 120 mg, Intravenous, Once, Sindy Guadeloupe, MD   dacarbazine (DTIC) 890 mg in sodium chloride 0.9 % 250 mL chemo infusion, 375 mg/m2 (Treatment Plan Recorded), Intravenous, Once, Sindy Guadeloupe, MD, Last Rate: 339 mL/hr at 09/16/21 1135, 890 mg at 09/16/21 1135   heparin lock flush 100 unit/mL, 500 Units, Intracatheter, Once PRN, Sindy Guadeloupe, MD   pegfilgrastim (NEULASTA ONPRO KIT) injection 6 mg, 6 mg, Subcutaneous, Once, Sindy Guadeloupe, MD  Physical exam:  Vitals:   09/16/21 0911  BP: (!) 156/84  Pulse: 75  Resp: 16  Temp: (!) 97.1 F (36.2 C)  Weight: 238 lb (108 kg)  Height: 5' 11"  (1.803 m)   Physical Exam Constitutional:      General: He is not in acute distress. Cardiovascular:     Rate and Rhythm: Normal rate and regular rhythm.     Heart sounds: Normal heart sounds.  Pulmonary:     Effort: Pulmonary effort is normal.     Breath sounds: Normal breath sounds.  Abdominal:     General: Bowel sounds are normal.      Palpations: Abdomen is soft.  Skin:    General: Skin is warm and dry.  Neurological:     Mental Status: He is alert and oriented to person, place, and time.     CMP Latest Ref Rng & Units 09/16/2021  Glucose 70 - 99 mg/dL 154(H)  BUN 6 - 20 mg/dL 14  Creatinine 0.61 - 1.24 mg/dL 0.79  Sodium 135 -  145 mmol/L 134(L)  Potassium 3.5 - 5.1 mmol/L 3.7  Chloride 98 - 111 mmol/L 103  CO2 22 - 32 mmol/L 24  Calcium 8.9 - 10.3 mg/dL 8.9  Total Protein 6.5 - 8.1 g/dL 6.8  Total Bilirubin 0.3 - 1.2 mg/dL 0.5  Alkaline Phos 38 - 126 U/L 70  AST 15 - 41 U/L 22  ALT 0 - 44 U/L 28   CBC Latest Ref Rng & Units 09/16/2021  WBC 4.0 - 10.5 K/uL 9.0  Hemoglobin 13.0 - 17.0 g/dL 11.3(L)  Hematocrit 39.0 - 52.0 % 34.5(L)  Platelets 150 - 400 K/uL 232    Assessment and plan- Patient is a 61 y.o. male with history of stage III classical Hodgkin's lymphoma here for on treatment assessment prior to cycle 4-day 15 of BV AVD chemotherapy  Counts okay to proceed with cycle 4-day 15 of BV AVD chemotherapy today with on pro Neulasta support.  Given his ongoing peripheral neuropathy which is still grade 1 I am reducing the dose of vinblastine.  We will continue with the full dose of brentuximab at this time but consider dose reduction if it progresses to grade 2 with next cycle.  We discussed treatment options for peripheral neuropathy such as gabapentin or duloxetine which patient does not wish to use at this point.  We will see how his symptoms do at next visit.  Hyperglycemia: Better after starting insulin.  I will see him back in 2 weeks for cycle 5-day 1 of BV AVD chemotherapy   Visit Diagnosis 1. Encounter for antineoplastic chemotherapy   2. Chemotherapy-induced peripheral neuropathy ([place])   3. 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 Randa Evens, MD, MPH [facility] [phone] 09/16/2021 12:27 PM

## 2021-09-16 NOTE — Progress Notes (Signed)
Pt still has nasal drainage and he has had that for a long time. He has numbness of both hands since last treatment. He has 2 diarrhea stools a day. Asked him to start taking imodium. ?

## 2021-09-30 ENCOUNTER — Encounter: Payer: Self-pay | Admitting: Oncology

## 2021-09-30 ENCOUNTER — Other Ambulatory Visit: Payer: Self-pay

## 2021-09-30 ENCOUNTER — Inpatient Hospital Stay: Payer: BC Managed Care – PPO

## 2021-09-30 ENCOUNTER — Other Ambulatory Visit: Payer: Self-pay | Admitting: *Deleted

## 2021-09-30 ENCOUNTER — Inpatient Hospital Stay (HOSPITAL_BASED_OUTPATIENT_CLINIC_OR_DEPARTMENT_OTHER): Payer: BC Managed Care – PPO | Admitting: Oncology

## 2021-09-30 VITALS — BP 143/83 | HR 79 | Temp 97.3°F | Resp 18 | Ht 71.25 in | Wt 239.5 lb

## 2021-09-30 DIAGNOSIS — G620 Drug-induced polyneuropathy: Secondary | ICD-10-CM

## 2021-09-30 DIAGNOSIS — C8198 Hodgkin lymphoma, unspecified, lymph nodes of multiple sites: Secondary | ICD-10-CM | POA: Diagnosis not present

## 2021-09-30 DIAGNOSIS — T451X5A Adverse effect of antineoplastic and immunosuppressive drugs, initial encounter: Secondary | ICD-10-CM | POA: Diagnosis not present

## 2021-09-30 DIAGNOSIS — C8176 Other classical Hodgkin lymphoma, intrapelvic lymph nodes: Secondary | ICD-10-CM | POA: Diagnosis not present

## 2021-09-30 DIAGNOSIS — C8178 Other classical Hodgkin lymphoma, lymph nodes of multiple sites: Secondary | ICD-10-CM

## 2021-09-30 DIAGNOSIS — Z5111 Encounter for antineoplastic chemotherapy: Secondary | ICD-10-CM

## 2021-09-30 LAB — COMPREHENSIVE METABOLIC PANEL
ALT: 27 U/L (ref 0–44)
AST: 20 U/L (ref 15–41)
Albumin: 4.2 g/dL (ref 3.5–5.0)
Alkaline Phosphatase: 94 U/L (ref 38–126)
Anion gap: 8 (ref 5–15)
BUN: 15 mg/dL (ref 6–20)
CO2: 25 mmol/L (ref 22–32)
Calcium: 9 mg/dL (ref 8.9–10.3)
Chloride: 102 mmol/L (ref 98–111)
Creatinine, Ser: 0.8 mg/dL (ref 0.61–1.24)
GFR, Estimated: >60 mL/min (ref >60)
Glucose, Bld: 174 mg/dL — ABNORMAL HIGH (ref 70–99)
Potassium: 4 mmol/L (ref 3.5–5.1)
Sodium: 135 mmol/L (ref 135–145)
Total Bilirubin: 0.2 mg/dL — ABNORMAL LOW (ref 0.3–1.2)
Total Protein: 6.8 g/dL (ref 6.5–8.1)

## 2021-09-30 LAB — CBC WITH DIFFERENTIAL/PLATELET
Abs Immature Granulocytes: 0.2 10*3/uL — ABNORMAL HIGH (ref 0.00–0.07)
Basophils Absolute: 0.1 10*3/uL (ref 0.0–0.1)
Basophils Relative: 0 %
Eosinophils Absolute: 0.1 10*3/uL (ref 0.0–0.5)
Eosinophils Relative: 1 %
HCT: 35.7 % — ABNORMAL LOW (ref 39.0–52.0)
Hemoglobin: 11.6 g/dL — ABNORMAL LOW (ref 13.0–17.0)
Immature Granulocytes: 1 %
Lymphocytes Relative: 7 %
Lymphs Abs: 1.1 10*3/uL (ref 0.7–4.0)
MCH: 28.2 pg (ref 26.0–34.0)
MCHC: 32.5 g/dL (ref 30.0–36.0)
MCV: 86.9 fL (ref 80.0–100.0)
Monocytes Absolute: 0.8 10*3/uL (ref 0.1–1.0)
Monocytes Relative: 6 %
Neutro Abs: 12.1 10*3/uL — ABNORMAL HIGH (ref 1.7–7.7)
Neutrophils Relative %: 85 %
Platelets: 274 10*3/uL (ref 150–400)
RBC: 4.11 MIL/uL — ABNORMAL LOW (ref 4.22–5.81)
RDW: 15.7 % — ABNORMAL HIGH (ref 11.5–15.5)
WBC: 14.3 10*3/uL — ABNORMAL HIGH (ref 4.0–10.5)
nRBC: 0 % (ref 0.0–0.2)

## 2021-09-30 MED ORDER — PREGABALIN 75 MG PO CAPS: 75.0000 mg | ORAL_CAPSULE | Freq: Two times a day (BID) | ORAL | 2 refills | Status: DC

## 2021-09-30 MED ORDER — SODIUM CHLORIDE 0.9 % IV SOLN
Freq: Once | INTRAVENOUS | Status: AC
  Filled 2021-09-30: qty 250

## 2021-09-30 MED ORDER — HEPARIN SOD (PORK) LOCK FLUSH 100 UNIT/ML IV SOLN
500.0000 [IU] | Freq: Once | INTRAVENOUS | Status: AC | PRN
  Administered 2021-09-30: 500 [IU]
  Filled 2021-09-30: qty 5

## 2021-09-30 MED ORDER — PALONOSETRON HCL INJECTION 0.25 MG/5ML
0.2500 mg | Freq: Once | INTRAVENOUS | Status: AC
  Administered 2021-09-30: 0.25 mg via INTRAVENOUS
  Filled 2021-09-30: qty 5

## 2021-09-30 MED ORDER — SODIUM CHLORIDE 0.9 % IV SOLN
375.0000 mg/m2 | Freq: Once | INTRAVENOUS | Status: AC
  Administered 2021-09-30: 890 mg via INTRAVENOUS
  Filled 2021-09-30: qty 89

## 2021-09-30 MED ORDER — DEXAMETHASONE SODIUM PHOSPHATE 10 MG/ML IJ SOLN
4.0000 mg | Freq: Once | INTRAMUSCULAR | Status: AC
  Administered 2021-09-30: 4 mg via INTRAVENOUS
  Filled 2021-09-30: qty 1

## 2021-09-30 MED ORDER — SODIUM CHLORIDE 0.9 % IV SOLN
150.0000 mg | Freq: Once | INTRAVENOUS | Status: AC
  Administered 2021-09-30: 150 mg via INTRAVENOUS
  Filled 2021-09-30: qty 150

## 2021-09-30 MED ORDER — VINBLASTINE SULFATE CHEMO INJECTION 1 MG/ML
3.0000 mg/m2 | Freq: Once | INTRAVENOUS | Status: AC
  Administered 2021-09-30: 7.1 mg via INTRAVENOUS
  Filled 2021-09-30: qty 7.1

## 2021-09-30 MED ORDER — SODIUM CHLORIDE 0.9 % IV SOLN
90.0000 mg | Freq: Once | INTRAVENOUS | Status: AC
  Administered 2021-09-30: 90 mg via INTRAVENOUS
  Filled 2021-09-30: qty 18

## 2021-09-30 MED ORDER — DOXORUBICIN HCL CHEMO IV INJECTION 2 MG/ML
25.0000 mg/m2 | Freq: Once | INTRAVENOUS | Status: AC
  Administered 2021-09-30: 60 mg via INTRAVENOUS
  Filled 2021-09-30: qty 30

## 2021-09-30 MED ORDER — ACETAMINOPHEN 325 MG PO TABS
650.0000 mg | ORAL_TABLET | Freq: Once | ORAL | Status: AC
  Administered 2021-09-30: 650 mg via ORAL
  Filled 2021-09-30: qty 2

## 2021-09-30 MED ORDER — DIPHENHYDRAMINE HCL 50 MG/ML IJ SOLN
50.0000 mg | Freq: Once | INTRAMUSCULAR | Status: AC
  Administered 2021-09-30: 50 mg via INTRAVENOUS
  Filled 2021-09-30: qty 1

## 2021-09-30 MED ORDER — PEGFILGRASTIM 6 MG/0.6ML ~~LOC~~ PSKT
6.0000 mg | PREFILLED_SYRINGE | Freq: Once | SUBCUTANEOUS | Status: AC
  Administered 2021-09-30: 6 mg via SUBCUTANEOUS
  Filled 2021-09-30: qty 0.6

## 2021-09-30 NOTE — Progress Notes (Signed)
?  The numbness and tingling of hands has radiated to his feet as well.  ?

## 2021-09-30 NOTE — Progress Notes (Signed)
? ? ? ?Hematology/Oncology Consult note ?[place]  ?[phone] [phone] ? ?Patient Care Team: ?[place] Pink, MD as PCP - General (Family Medicine) ?Sindy Guadeloupe, MD as Consulting Physician (Oncology)  ? ?Name of the patient: Erik Buchanan  ?[id number]  ?05-Dec-1960  ? ?Date of visit: 09/30/21 ? ?Diagnosis- stage III classical Hodgkin's lymphoma ?  ? ?Chief complaint/ Reason for visit-on treatment assessment prior to cycle 5-day 1 of BV AVD chemotherapy ? ?Heme/Onc history: patient is a 61 year old male with a past medical history significant for hypertension and diabetes among other medical problems.  He was noted to have left-sided neck swelling which has been ongoing for the last 4 months but he feels that the swelling has slowly increased in size.  He was seen by Dr. Kary Kos and underwent CT chest and CT soft tissue neck after initial ultrasound of the neck.  CT showed bulky supraclavicular adenopathy with the largest node measuring 4.1 cm CT chest with contrast also showed similar findings and no evidence of right supraclavicular axillary mediastinal or hilar adenopathy.  Lungs were clear.  Multiple hypodense lesions in the spleen which nearly replaces the splenic parenchyma.  Spleen however does not appear enlarged.  ?  ?PET CT scan showed FDG avid adenopathy within the lower left neck to left supraclavicular region as well as small bowel mesentery peritoneum and porta hepatic lesions.  Multiple FDG avid splenic lesions.  Mild diffuse increased uptake in the bone marrow nonspecific.  No focal areas of increased uptake to suggest bone metastases. ?  ?Excisional supraclavicular lymph node biopsy showed classical Hodgkin's lymphoma. The H&E stained sections reveal lymph node effaced by a somewhat nodular proliferation of large, abnormal lymphoid cells, including "mummified" cells, "lacunar" cells, and occasional binucleated cells with classic Reed-Sternberg  morphology, in a background of predominantly small lymphocytes.  EBV negative ?  ?Stage III classical Hodgkin's lymphoma IPI score 2.5-year freedom from progression of 80% and 91% overall survival. ? ?Interval history-neuropathy hands is stable but he has developed tingling numbness now in his feet.  Also reports some difficulty buttoning his shirt because of dry hands and chapped fingertips.  Reports feeling fatigued. ? ?ECOG PS- 1 ?Pain scale- 0 ? ?Review of systems- Review of Systems  ?Constitutional:  Negative for chills, fever, malaise/fatigue and weight loss.  ?HENT:  Negative for congestion, ear discharge and nosebleeds.   ?Eyes:  Negative for blurred vision.  ?Respiratory:  Negative for cough, hemoptysis, sputum production, shortness of breath and wheezing.   ?Cardiovascular:  Negative for chest pain, palpitations, orthopnea and claudication.  ?Gastrointestinal:  Negative for abdominal pain, blood in stool, constipation, diarrhea, heartburn, melena, nausea and vomiting.  ?Genitourinary:  Negative for dysuria, flank pain, frequency, hematuria and urgency.  ?Musculoskeletal:  Negative for back pain, joint pain and myalgias.  ?Skin:  Negative for rash.  ?Neurological:  Positive for sensory change (Peripheral neuropathy). Negative for dizziness, tingling, focal weakness, seizures, weakness and headaches.  ?Endo/Heme/Allergies:  Does not bruise/bleed easily.  ?Psychiatric/Behavioral:  Negative for depression and suicidal ideas. The patient does not have insomnia.    ? ? ?Allergies  ?Allergen Reactions  ? Ultram [Tramadol Hcl] Other (See Comments)  ?  vertigo and passed out  ? ? ? ?Past Medical History:  ?Diagnosis Date  ? Anemia   ? Colon polyps   ? Depression   ? Diabetes mellitus without complication ([place])   ? GERD (gastroesophageal reflux disease)   ? Hemorrhoids   ? Hodgkin's lymphoma ([place])   ?  Hyperlipidemia   ? Hypertension   ? Pneumonia   ? Testicular hypofunction   ? Tinnitus   ? ? ? ?Past Surgical  History:  ?Procedure Laterality Date  ? COLONOSCOPY WITH PROPOFOL N/A 04/07/2015  ? Procedure: COLONOSCOPY WITH PROPOFOL;  Surgeon: Hulen Luster, MD;  Location: [facility] ENDOSCOPY;  Service: Gastroenterology;  Laterality: N/A;  ? COLONOSCOPY WITH PROPOFOL N/A 09/01/2020  ? Procedure: COLONOSCOPY WITH PROPOFOL;  Surgeon: Toledo, Benay Pike, MD;  Location: [facility] ENDOSCOPY;  Service: Gastroenterology;  Laterality: N/A;  ? EYE SURGERY    ? cataract  ? MASS BIOPSY Left 05/07/2021  ? Procedure: NECK MASS BIOPSY,;  Surgeon: Jules Husbands, MD;  Location: [facility] ORS;  Service: General;  Laterality: Left;  Provider requesting 1.5 hours / 90 minutes for procedure.  ? PORTACATH PLACEMENT Right 05/21/2021  ? Procedure: INSERTION PORT-A-CATH;  Surgeon: Jules Husbands, MD;  Location: [facility] ORS;  Service: General;  Laterality: Right;  ? ? ?Social History  ? ?Socioeconomic History  ? Marital status: Married  ?  Spouse name: [name]  ? Number of children: Not on file  ? Years of education: Not on file  ? Highest education level: Not on file  ?Occupational History  ? Not on file  ?Tobacco Use  ? Smoking status: Never  ? Smokeless tobacco: Never  ?Vaping Use  ? Vaping Use: Never used  ?Substance and Sexual Activity  ? Alcohol use: Never  ? Drug use: Never  ? Sexual activity: Yes  ?Other Topics Concern  ? Not on file  ?Social History Narrative  ? Not on file  ? ?Social Determinants of Health  ? ?Financial Resource Strain: Not on file  ?Food Insecurity: Not on file  ?Transportation Needs: Not on file  ?Physical Activity: Not on file  ?Stress: Not on file  ?Social Connections: Not on file  ?Intimate Partner Violence: Not on file  ? ? ?Family History  ?Problem Relation Age of Onset  ? Arthritis Mother   ? Stroke Paternal Aunt   ? ? ? ?Current Outpatient Medications:  ?  amLODipine (NORVASC) 5 MG tablet, Take 7.5 mg by mouth daily., Disp: , Rfl:  ?  buPROPion (WELLBUTRIN XL) 150 MG 24 hr tablet, Take 450 mg by mouth daily., Disp: , Rfl:  ?  dexamethasone  (DECADRON) 4 MG tablet, Take 2 tablets by mouth once a day for 3 days after chemo. Take with food., Disp: 30 tablet, Rfl: 1 ?  glipiZIDE (GLUCOTROL XL) 5 MG 24 hr tablet, Take 10 mg by mouth 2 (two) times daily., Disp: , Rfl:  ?  Homeopathic Products (LEG CRAMPS) TABS, Take by mouth as needed. Pt taking for leg cramps., Disp: , Rfl:  ?  LEVEMIR 100 UNIT/ML injection, Inject 15 Units into the skin every evening., Disp: , Rfl:  ?  lidocaine-prilocaine (EMLA) cream, Apply to affected area once, Disp: 30 g, Rfl: 3 ?  lisinopril (PRINIVIL,ZESTRIL) 10 MG tablet, Take 10 mg by mouth daily., Disp: , Rfl:  ?  loratadine (CLARITIN) 10 MG tablet, Take 10 mg by mouth daily as needed for allergies., Disp: , Rfl:  ?  LORazepam (ATIVAN) 0.5 MG tablet, Take 1 tablet (0.5 mg total) by mouth every 6 (six) hours as needed (Nausea or vomiting)., Disp: 30 tablet, Rfl: 0 ?  metFORMIN (GLUCOPHAGE) 500 MG tablet, Take 1,000 mg by mouth 2 (two) times daily., Disp: , Rfl:  ?  metFORMIN (GLUCOPHAGE-XR) 500 MG 24 hr tablet, Take 1,000 mg by mouth in  the morning and at bedtime., Disp: , Rfl:  ?  ondansetron (ZOFRAN) 8 MG tablet, Take 1 tablet (8 mg total) by mouth 2 (two) times daily as needed. Start on the third day after chemotherapy., Disp: 30 tablet, Rfl: 1 ?  oxyCODONE (OXY IR/ROXICODONE) 5 MG immediate release tablet, Take 1 tablet (5 mg total) by mouth every 4 (four) hours as needed for severe pain., Disp: 30 tablet, Rfl: 0 ?  pantoprazole (PROTONIX) 20 MG tablet, Take 1 tablet by mouth once daily (Patient taking differently: 20 mg as needed.), Disp: 30 tablet, Rfl: 0 ?  pregabalin (LYRICA) 75 MG capsule, Take 1 capsule (75 mg total) by mouth 2 (two) times daily., Disp: 56 capsule, Rfl: 2 ?  prochlorperazine (COMPAZINE) 10 MG tablet, Take 10 mg by mouth every 6 (six) hours as needed., Disp: , Rfl:  ?  simvastatin (ZOCOR) 20 MG tablet, Take 20 mg by mouth every evening., Disp: , Rfl:  ?  sodium chloride (OCEAN) 0.65 % SOLN nasal spray,  Place 1 spray into both nostrils as needed for congestion., Disp: , Rfl:  ?  tetrahydrozoline-zinc (VISINE-AC) 0.05-0.25 % ophthalmic solution, Place 2 drops into both eyes 3 (three) times daily as needed (dry e

## 2021-09-30 NOTE — Patient Instructions (Addendum)
Vinblastine injection ?What is this medication? ?VINBLASTINE (vin BLAS teen) is a chemotherapy drug. It slows the growth of cancer cells. This medicine is used to treat many types of cancer like breast cancer, testicular cancer, Hodgkin's disease, non-Hodgkin's lymphoma, and sarcoma. ?This medicine may be used for other purposes; ask your health care provider or pharmacist if you have questions. ?COMMON BRAND NAME(S): Velban ?What should I tell my care team before I take this medication? ?They need to know if you have any of these conditions: ?blood disorders ?dental disease ?gout ?infection (especially a virus infection such as chickenpox, cold sores, or herpes) ?liver disease ?lung disease ?nervous system disease ?recent or ongoing radiation therapy ?an unusual or allergic reaction to vinblastine, other chemotherapy agents, other medicines, foods, dyes, or preservatives ?pregnant or trying to get pregnant ?breast-feeding ?How should I use this medication? ?This drug is given as an infusion into a vein. It is administered in a hospital or clinic by a specially trained health care professional. If you have pain, swelling, burning or any unusual feeling around the site of your injection, tell your health care professional right away. ?Talk to your pediatrician regarding the use of this medicine in children. While this drug may be prescribed for selected conditions, precautions do apply. ?Overdosage: If you think you have taken too much of this medicine contact a poison control center or emergency room at once. ?NOTE: This medicine is only for you. Do not share this medicine with others. ?What if I miss a dose? ?It is important not to miss your dose. Call your doctor or health care professional if you are unable to keep an appointment. ?What may interact with this medication? ?erythromycin ?certain medicines for fungal infections like itraconazole, ketoconazole, posaconazole, voriconazole ?certain medicines for  seizures like phenytoin ?This list may not describe all possible interactions. Give your health care provider a list of all the medicines, herbs, non-prescription drugs, or dietary supplements you use. Also tell them if you smoke, drink alcohol, or use illegal drugs. Some items may interact with your medicine. ?What should I watch for while using this medication? ?Your condition will be monitored carefully while you are receiving this medicine. You will need important blood work done while you are taking this medicine. ?This drug may make you feel generally unwell. This is not uncommon, as chemotherapy can affect healthy cells as well as cancer cells. Report any side effects. Continue your course of treatment even though you feel ill unless your doctor tells you to stop. ?In some cases, you may be given additional medicines to help with side effects. Follow all directions for their use. ?Call your doctor or health care professional for advice if you get a fever, chills or sore throat, or other symptoms of a cold or flu. Do not treat yourself. This drug decreases your body's ability to fight infections. Try to avoid being around people who are sick. ?This medicine may increase your risk to bruise or bleed. Call your doctor or health care professional if you notice any unusual bleeding. ?Be careful brushing and flossing your teeth or using a toothpick because you may get an infection or bleed more easily. If you have any dental work done, tell your dentist you are receiving this medicine. ?Avoid taking products that contain aspirin, acetaminophen, ibuprofen, naproxen, or ketoprofen unless instructed by your doctor. These medicines may hide a fever. ?Do not become pregnant while taking this medicine. Women should inform their doctor if they wish to become pregnant or  think they might be pregnant. There is a potential for serious side effects to an unborn child. Talk to your health care professional or pharmacist for  more information. Do not breast-feed an infant while taking this medicine. ?Men may have a lower sperm count while taking this medicine. Talk to your doctor if you plan to father a child. ?What side effects may I notice from receiving this medication? ?Side effects that you should report to your doctor or health care professional as soon as possible: ?allergic reactions like skin rash, itching or hives, swelling of the face, lips, or tongue ?low blood counts - This drug may decrease the number of white blood cells, red blood cells and platelets. You may be at increased risk for infections and bleeding. ?signs of infection - fever or chills, cough, sore throat, pain or difficulty passing urine ?signs of decreased platelets or bleeding - bruising, pinpoint red spots on the skin, black, tarry stools, nosebleeds ?signs of decreased red blood cells - unusually weak or tired, fainting spells, lightheadedness ?breathing problems ?changes in hearing ?change in the amount of urine ?chest pain ?high blood pressure ?mouth sores ?nausea and vomiting ?pain, swelling, redness or irritation at the injection site ?pain, tingling, numbness in the hands or feet ?problems with balance, dizziness ?seizures ?Side effects that usually do not require medical attention (report to your doctor or health care professional if they continue or are bothersome): ?constipation ?hair loss ?jaw pain ?loss of appetite ?sensitivity to light ?stomach pain ?tumor pain ?This list may not describe all possible side effects. Call your doctor for medical advice about side effects. You may report side effects to FDA at 1-800-FDA-1088. ?Where should I keep my medication? ?This drug is given in a hospital or clinic and will not be stored at home. ?NOTE: This sheet is a summary. It may not cover all possible information. If you have questions about this medicine, talk to your doctor, pharmacist, or health care provider. ?? 2022 Elsevier/Gold Standard  (2021-03-17 00:00:00) ?Brentuximab vedotin solution for injection ?What is this medication? ?BRENTUXIMAB VEDOTIN (bren TUX see mab ve DOE tin) is a monoclonal antibody and a chemotherapy drug. It is used for treating Hodgkin lymphoma and certain non-Hodgkin lymphomas, such as anaplastic large-cell lymphoma, mycosis fungoides, and peripheral T-cell lymphoma. ?This medicine may be used for other purposes; ask your health care provider or pharmacist if you have questions. ?COMMON BRAND NAME(S): ADCETRIS ?What should I tell my care team before I take this medication? ?They need to know if you have any of these conditions: ?immune system problems ?infection (especially a virus infection such as chickenpox, cold sores, or herpes) ?kidney disease ?liver disease ?low blood counts, like low white cell, platelet, or red cell counts ?tingling of the fingers or toes, or other nerve disorder ?an unusual or allergic reaction to brentuximab vedotin, other medicines, foods, dyes, or preservatives ?pregnant or trying to get pregnant ?breast-feeding ?How should I use this medication? ?This medicine is for infusion into a vein. It is given by a health care professional in a hospital or clinic setting. ?Talk to your pediatrician regarding the use of this medicine in children. Special care may be needed. ?Overdosage: If you think you have taken too much of this medicine contact a poison control center or emergency room at once. ?NOTE: This medicine is only for you. Do not share this medicine with others. ?What if I miss a dose? ?It is important not to miss your dose. Call your doctor or health  care professional if you are unable to keep an appointment. ?What may interact with this medication? ?Do not take this medicine with any of the following medications: ?bleomycin ?This medicine may also interact with the following medications: ?ketoconazole ?rifampin ?St. John's wort; Hypericum perforatum ?This list may not describe all possible  interactions. Give your health care provider a list of all the medicines, herbs, non-prescription drugs, or dietary supplements you use. Also tell them if you smoke, drink alcohol, or use illegal drugs. Some items may in

## 2021-10-14 ENCOUNTER — Inpatient Hospital Stay (HOSPITAL_BASED_OUTPATIENT_CLINIC_OR_DEPARTMENT_OTHER): Payer: BC Managed Care – PPO | Admitting: Oncology

## 2021-10-14 ENCOUNTER — Inpatient Hospital Stay: Payer: BC Managed Care – PPO | Attending: Oncology

## 2021-10-14 ENCOUNTER — Inpatient Hospital Stay: Payer: BC Managed Care – PPO

## 2021-10-14 VITALS — BP 134/81 | HR 71 | Temp 98.1°F | Resp 16 | Ht 71.25 in | Wt 240.8 lb

## 2021-10-14 DIAGNOSIS — D6481 Anemia due to antineoplastic chemotherapy: Secondary | ICD-10-CM | POA: Insufficient documentation

## 2021-10-14 DIAGNOSIS — G620 Drug-induced polyneuropathy: Secondary | ICD-10-CM

## 2021-10-14 DIAGNOSIS — C8198 Hodgkin lymphoma, unspecified, lymph nodes of multiple sites: Secondary | ICD-10-CM | POA: Diagnosis present

## 2021-10-14 DIAGNOSIS — R5383 Other fatigue: Secondary | ICD-10-CM

## 2021-10-14 DIAGNOSIS — Z79899 Other long term (current) drug therapy: Secondary | ICD-10-CM | POA: Diagnosis not present

## 2021-10-14 DIAGNOSIS — C8178 Other classical Hodgkin lymphoma, lymph nodes of multiple sites: Secondary | ICD-10-CM

## 2021-10-14 DIAGNOSIS — T451X5A Adverse effect of antineoplastic and immunosuppressive drugs, initial encounter: Secondary | ICD-10-CM

## 2021-10-14 DIAGNOSIS — Z5111 Encounter for antineoplastic chemotherapy: Secondary | ICD-10-CM | POA: Insufficient documentation

## 2021-10-14 LAB — CBC WITH DIFFERENTIAL/PLATELET
Abs Immature Granulocytes: 0.11 10*3/uL — ABNORMAL HIGH (ref 0.00–0.07)
Basophils Absolute: 0 10*3/uL (ref 0.0–0.1)
Basophils Relative: 0 %
Eosinophils Absolute: 0.1 10*3/uL (ref 0.0–0.5)
Eosinophils Relative: 1 %
HCT: 33.6 % — ABNORMAL LOW (ref 39.0–52.0)
Hemoglobin: 10.9 g/dL — ABNORMAL LOW (ref 13.0–17.0)
Immature Granulocytes: 1 %
Lymphocytes Relative: 14 %
Lymphs Abs: 1.6 10*3/uL (ref 0.7–4.0)
MCH: 28.2 pg (ref 26.0–34.0)
MCHC: 32.4 g/dL (ref 30.0–36.0)
MCV: 86.8 fL (ref 80.0–100.0)
Monocytes Absolute: 0.6 10*3/uL (ref 0.1–1.0)
Monocytes Relative: 5 %
Neutro Abs: 9.1 10*3/uL — ABNORMAL HIGH (ref 1.7–7.7)
Neutrophils Relative %: 79 %
Platelets: 251 10*3/uL (ref 150–400)
RBC: 3.87 MIL/uL — ABNORMAL LOW (ref 4.22–5.81)
RDW: 15.5 % (ref 11.5–15.5)
WBC: 11.6 10*3/uL — ABNORMAL HIGH (ref 4.0–10.5)
nRBC: 0 % (ref 0.0–0.2)

## 2021-10-14 LAB — COMPREHENSIVE METABOLIC PANEL
ALT: 28 U/L (ref 0–44)
AST: 21 U/L (ref 15–41)
Albumin: 4 g/dL (ref 3.5–5.0)
Alkaline Phosphatase: 78 U/L (ref 38–126)
Anion gap: 5 (ref 5–15)
BUN: 16 mg/dL (ref 6–20)
CO2: 25 mmol/L (ref 22–32)
Calcium: 8.9 mg/dL (ref 8.9–10.3)
Chloride: 105 mmol/L (ref 98–111)
Creatinine, Ser: 0.8 mg/dL (ref 0.61–1.24)
GFR, Estimated: >60 mL/min (ref >60)
Glucose, Bld: 161 mg/dL — ABNORMAL HIGH (ref 70–99)
Potassium: 3.9 mmol/L (ref 3.5–5.1)
Sodium: 135 mmol/L (ref 135–145)
Total Bilirubin: 0.4 mg/dL (ref 0.3–1.2)
Total Protein: 6.6 g/dL (ref 6.5–8.1)

## 2021-10-14 MED ORDER — PROCHLORPERAZINE EDISYLATE 10 MG/2ML IJ SOLN
10.0000 mg | Freq: Once | INTRAMUSCULAR | Status: AC
  Administered 2021-10-14: 10 mg via INTRAVENOUS
  Filled 2021-10-14: qty 2

## 2021-10-14 MED ORDER — PEGFILGRASTIM 6 MG/0.6ML ~~LOC~~ PSKT
6.0000 mg | PREFILLED_SYRINGE | Freq: Once | SUBCUTANEOUS | Status: AC
  Administered 2021-10-14: 6 mg via SUBCUTANEOUS
  Filled 2021-10-14: qty 0.6

## 2021-10-14 MED ORDER — HEPARIN SOD (PORK) LOCK FLUSH 100 UNIT/ML IV SOLN
500.0000 [IU] | Freq: Once | INTRAVENOUS | Status: AC | PRN
  Administered 2021-10-14: 500 [IU]
  Filled 2021-10-14: qty 5

## 2021-10-14 MED ORDER — SODIUM CHLORIDE 0.9 % IV SOLN
Freq: Once | INTRAVENOUS | Status: AC
  Filled 2021-10-14: qty 250

## 2021-10-14 MED ORDER — DOXORUBICIN HCL CHEMO IV INJECTION 2 MG/ML
25.0000 mg/m2 | Freq: Once | INTRAVENOUS | Status: AC
  Administered 2021-10-14: 60 mg via INTRAVENOUS
  Filled 2021-10-14 (×2): qty 30

## 2021-10-14 MED ORDER — PALONOSETRON HCL INJECTION 0.25 MG/5ML
0.2500 mg | Freq: Once | INTRAVENOUS | Status: AC
  Administered 2021-10-14: 0.25 mg via INTRAVENOUS
  Filled 2021-10-14: qty 5

## 2021-10-14 MED ORDER — VINBLASTINE SULFATE CHEMO INJECTION 1 MG/ML
3.0000 mg/m2 | Freq: Once | INTRAVENOUS | Status: AC
  Administered 2021-10-14: 7.1 mg via INTRAVENOUS
  Filled 2021-10-14: qty 7.1

## 2021-10-14 MED ORDER — SODIUM CHLORIDE 0.9 % IV SOLN
90.0000 mg | Freq: Once | INTRAVENOUS | Status: AC
  Administered 2021-10-14: 90 mg via INTRAVENOUS
  Filled 2021-10-14: qty 18

## 2021-10-14 MED ORDER — HEPARIN SOD (PORK) LOCK FLUSH 100 UNIT/ML IV SOLN
INTRAVENOUS | Status: AC
  Filled 2021-10-14: qty 5

## 2021-10-14 MED ORDER — DEXAMETHASONE SODIUM PHOSPHATE 10 MG/ML IJ SOLN
4.0000 mg | Freq: Once | INTRAMUSCULAR | Status: AC
  Administered 2021-10-14: 4 mg via INTRAVENOUS
  Filled 2021-10-14: qty 1

## 2021-10-14 MED ORDER — DIPHENHYDRAMINE HCL 50 MG/ML IJ SOLN
50.0000 mg | Freq: Once | INTRAMUSCULAR | Status: AC
  Administered 2021-10-14: 50 mg via INTRAVENOUS
  Filled 2021-10-14: qty 1

## 2021-10-14 MED ORDER — ACETAMINOPHEN 325 MG PO TABS
650.0000 mg | ORAL_TABLET | Freq: Once | ORAL | Status: AC
  Administered 2021-10-14: 650 mg via ORAL
  Filled 2021-10-14: qty 2

## 2021-10-14 MED ORDER — SODIUM CHLORIDE 0.9 % IV SOLN
375.0000 mg/m2 | Freq: Once | INTRAVENOUS | Status: AC
  Administered 2021-10-14: 890 mg via INTRAVENOUS
  Filled 2021-10-14: qty 89

## 2021-10-14 MED ORDER — SODIUM CHLORIDE 0.9 % IV SOLN
150.0000 mg | Freq: Once | INTRAVENOUS | Status: AC
  Administered 2021-10-14: 150 mg via INTRAVENOUS
  Filled 2021-10-14: qty 150

## 2021-10-14 NOTE — Progress Notes (Signed)
Pt still having neuropathy fingers and feet but it is better and he does not want to start pregabalin at this time. Some nausea but takes med and it works ?

## 2021-10-14 NOTE — Patient Instructions (Signed)
[facility]CER [facility] AT [place]  Discharge Instructions: ?Thank you for choosing [facility] to provide your oncology and hematology care.  ?If you have a lab appointment with the [facility], please go directly to the [facility] and check in at the registration area. ? ?Wear comfortable clothing and clothing appropriate for easy access to any Portacath or PICC line.  ? ?We strive to give you quality time with your provider. You may need to reschedule your appointment if you arrive late (15 or more minutes).  Arriving late affects you and other patients whose appointments are after yours.  Also, if you miss three or more appointments without notifying the office, you may be dismissed from the clinic at the provider?s discretion.    ?  ?For prescription refill requests, have your pharmacy contact our office and allow 72 hours for refills to be completed.   ? ?Today you received the following chemotherapy and/or immunotherapy agents Adriamycin, Vinblastine, Dacarbazbine, and Adcetris     ?  ?To help prevent nausea and vomiting after your treatment, we encourage you to take your nausea medication as directed. ? ?BELOW ARE SYMPTOMS THAT SHOULD BE REPORTED IMMEDIATELY: ?*FEVER GREATER THAN 100.4 F (38 ?C) OR HIGHER ?*CHILLS OR SWEATING ?*NAUSEA AND VOMITING THAT IS NOT CONTROLLED WITH YOUR NAUSEA MEDICATION ?*UNUSUAL SHORTNESS OF BREATH ?*UNUSUAL BRUISING OR BLEEDING ?*URINARY PROBLEMS (pain or burning when urinating, or frequent urination) ?*BOWEL PROBLEMS (unusual diarrhea, constipation, pain near the anus) ?TENDERNESS IN MOUTH AND THROAT WITH OR WITHOUT PRESENCE OF ULCERS (sore throat, sores in mouth, or a toothache) ?UNUSUAL RASH, SWELLING OR PAIN  ?UNUSUAL VAGINAL DISCHARGE OR ITCHING  ? ?Items with * indicate a potential emergency and should be followed up as soon as possible or go to the Emergency Department if any problems should occur. ? ?Please show the CHEMOTHERAPY ALERT CARD  or IMMUNOTHERAPY ALERT CARD at check-in to the Emergency Department and triage nurse. ? ?Should you have questions after your visit or need to cancel or reschedule your appointment, please contact [facility] [place] AT [place]  [phone] and follow the prompts.  Office hours are 8:00 a.m. to 4:30 p.m. Monday - Friday. Please note that voicemails left after 4:00 p.m. may not be returned until the following business day.  We are closed weekends and major holidays. You have access to a nurse at all times for urgent questions. Please call the main number to the clinic [phone] and follow the prompts. ? ?For any non-urgent questions, you may also contact your provider using MyChart. We now offer e-Visits for anyone 44 and older to request care online for non-urgent symptoms. For details visit [email or web address]. ?  ?Also download the MyChart app! Go to the app store, search "MyChart", open the app, select [facility], and log in with your MyChart username and password. ? ?Due to Covid, a mask is required upon entering the hospital/clinic. If you do not have a mask, one will be given to you upon arrival. For doctor visits, patients may have 1 support person aged 32 or older with them. For treatment visits, patients cannot have anyone with them due to current Covid guidelines and our immunocompromised population.  ?

## 2021-10-14 NOTE — Progress Notes (Signed)
? ? ? ?Hematology/Oncology Consult note ?[facility]  ?[phone] [phone] ? ?Patient Care Team: ?[place] Pink, MD as PCP - General (Family Medicine) ?Sindy Guadeloupe, MD as Consulting Physician (Oncology)  ? ?Name of the patient: Erik Buchanan  ?[id number]  ?1960/11/17  ? ?Date of visit: 10/14/21 ? ?Diagnosis- stage III classical Hodgkin's lymphoma ?  ? ?Chief complaint/ Reason for visit-on treatment assessment prior to cycle 5-day 15 of BV AVD chemotherapy ? ?Heme/Onc history: patient is a 61 year old male with a past medical history significant for hypertension and diabetes among other medical problems.  He was noted to have left-sided neck swelling which has been ongoing for the last 4 months but he feels that the swelling has slowly increased in size.  He was seen by Dr. Kary Kos and underwent CT chest and CT soft tissue neck after initial ultrasound of the neck.  CT showed bulky supraclavicular adenopathy with the largest node measuring 4.1 cm CT chest with contrast also showed similar findings and no evidence of right supraclavicular axillary mediastinal or hilar adenopathy.  Lungs were clear.  Multiple hypodense lesions in the spleen which nearly replaces the splenic parenchyma.  Spleen however does not appear enlarged.  ?  ?PET CT scan showed FDG avid adenopathy within the lower left neck to left supraclavicular region as well as small bowel mesentery peritoneum and porta hepatic lesions.  Multiple FDG avid splenic lesions.  Mild diffuse increased uptake in the bone marrow nonspecific.  No focal areas of increased uptake to suggest bone metastases. ?  ?Excisional supraclavicular lymph node biopsy showed classical Hodgkin's lymphoma. The H&E stained sections reveal lymph node effaced by a somewhat nodular proliferation of large, abnormal lymphoid cells, including "mummified" cells, "lacunar" cells, and occasional binucleated cells with classic Reed-Sternberg  morphology, in a background of predominantly small lymphocytes.  EBV negative ?  ?Stage III classical Hodgkin's lymphoma IPI score 2.5-year freedom from progression of 80% and 91% overall survival. ? ?Interval history-reports that his neuropathy symptoms have been stable to mildly improved as compared to last cycle.  He continues to have fatigue but is able to carry on with his full-time job. ? ?ECOG PS- 1 ?Pain scale- 0 ? ? ?Review of systems- Review of Systems  ?Constitutional:  Positive for malaise/fatigue. Negative for chills, fever and weight loss.  ?HENT:  Negative for congestion, ear discharge and nosebleeds.   ?Eyes:  Negative for blurred vision.  ?Respiratory:  Negative for cough, hemoptysis, sputum production, shortness of breath and wheezing.   ?Cardiovascular:  Negative for chest pain, palpitations, orthopnea and claudication.  ?Gastrointestinal:  Negative for abdominal pain, blood in stool, constipation, diarrhea, heartburn, melena, nausea and vomiting.  ?Genitourinary:  Negative for dysuria, flank pain, frequency, hematuria and urgency.  ?Musculoskeletal:  Negative for back pain, joint pain and myalgias.  ?Skin:  Negative for rash.  ?Neurological:  Positive for sensory change (Peripheral neuropathy). Negative for dizziness, tingling, focal weakness, seizures, weakness and headaches.  ?Endo/Heme/Allergies:  Does not bruise/bleed easily.  ?Psychiatric/Behavioral:  Negative for depression and suicidal ideas. The patient does not have insomnia.    ? ? ?Allergies  ?Allergen Reactions  ? Ultram [Tramadol Hcl] Other (See Comments)  ?  vertigo and passed out  ? ? ? ?Past Medical History:  ?Diagnosis Date  ? Anemia   ? Colon polyps   ? Depression   ? Diabetes mellitus without complication ([place])   ? GERD (gastroesophageal reflux disease)   ? Hemorrhoids   ? Hodgkin's  lymphoma ([place])   ? Hyperlipidemia   ? Hypertension   ? Pneumonia   ? Testicular hypofunction   ? Tinnitus   ? ? ? ?Past Surgical History:   ?Procedure Laterality Date  ? COLONOSCOPY WITH PROPOFOL N/A 04/07/2015  ? Procedure: COLONOSCOPY WITH PROPOFOL;  Surgeon: Hulen Luster, MD;  Location[facility] ENDOSCOPY;  Service: Gastroenterology;  Laterality: N/A;  ? COLONOSCOPY WITH PROPOFOL N/A 09/01/2020  ? Procedure: COLONOSCOPY WITH PROPOFOL;  Surgeon: Toledo, Benay Pike, MD;  Location: [facility] ENDOSCOPY;  Service: Gastroenterology;  Laterality: N/A;  ? EYE SURGERY    ? cataract  ? MASS BIOPSY Left 05/07/2021  ? Procedure: NECK MASS BIOPSY,;  Surgeon: Jules Husbands, MD;  Location: [facility] ORS;  Service: General;  Laterality: Left;  Provider requesting 1.5 hours / 90 minutes for procedure.  ? PORTACATH PLACEMENT Right 05/21/2021  ? Procedure: INSERTION PORT-A-CATH;  Surgeon: Jules Husbands, MD;  Location: [facility] ORS;  Service: General;  Laterality: Right;  ? ? ?Social History  ? ?Socioeconomic History  ? Marital status: Married  ?  Spouse name: [name]  ? Number of children: Not on file  ? Years of education: Not on file  ? Highest education level: Not on file  ?Occupational History  ? Not on file  ?Tobacco Use  ? Smoking status: Never  ? Smokeless tobacco: Never  ?Vaping Use  ? Vaping Use: Never used  ?Substance and Sexual Activity  ? Alcohol use: Never  ? Drug use: Never  ? Sexual activity: Yes  ?Other Topics Concern  ? Not on file  ?Social History Narrative  ? Not on file  ? ?Social Determinants of Health  ? ?Financial Resource Strain: Not on file  ?Food Insecurity: Not on file  ?Transportation Needs: Not on file  ?Physical Activity: Not on file  ?Stress: Not on file  ?Social Connections: Not on file  ?Intimate Partner Violence: Not on file  ? ? ?Family History  ?Problem Relation Age of Onset  ? Arthritis Mother   ? Stroke Paternal Aunt   ? ? ? ?Current Outpatient Medications:  ?  amLODipine (NORVASC) 5 MG tablet, Take 7.5 mg by mouth daily., Disp: , Rfl:  ?  buPROPion (WELLBUTRIN XL) 150 MG 24 hr tablet, Take 450 mg by mouth daily., Disp: , Rfl:  ?  dexamethasone  (DECADRON) 4 MG tablet, Take 2 tablets by mouth once a day for 3 days after chemo. Take with food., Disp: 30 tablet, Rfl: 1 ?  glipiZIDE (GLUCOTROL XL) 5 MG 24 hr tablet, Take 10 mg by mouth 2 (two) times daily., Disp: , Rfl:  ?  Homeopathic Products (LEG CRAMPS) TABS, Take by mouth as needed. Pt taking for leg cramps., Disp: , Rfl:  ?  insulin glargine (LANTUS) 100 UNIT/ML injection, Inject 15 Units into the skin daily. At evening, Disp: , Rfl:  ?  lidocaine-prilocaine (EMLA) cream, Apply to affected area once, Disp: 30 g, Rfl: 3 ?  lisinopril (PRINIVIL,ZESTRIL) 10 MG tablet, Take 10 mg by mouth daily., Disp: , Rfl:  ?  loratadine (CLARITIN) 10 MG tablet, Take 10 mg by mouth daily as needed for allergies., Disp: , Rfl:  ?  LORazepam (ATIVAN) 0.5 MG tablet, Take 1 tablet (0.5 mg total) by mouth every 6 (six) hours as needed (Nausea or vomiting)., Disp: 30 tablet, Rfl: 0 ?  metFORMIN (GLUCOPHAGE) 500 MG tablet, Take 1,000 mg by mouth 2 (two) times daily., Disp: , Rfl:  ?  oxyCODONE (OXY IR/ROXICODONE) 5 MG  immediate release tablet, Take 1 tablet (5 mg total) by mouth every 4 (four) hours as needed for severe pain., Disp: 30 tablet, Rfl: 0 ?  pantoprazole (PROTONIX) 20 MG tablet, Take 1 tablet by mouth once daily (Patient taking differently: 20 mg as needed.), Disp: 30 tablet, Rfl: 0 ?  prochlorperazine (COMPAZINE) 10 MG tablet, Take 10 mg by mouth every 6 (six) hours as needed., Disp: , Rfl:  ?  simvastatin (ZOCOR) 20 MG tablet, Take 20 mg by mouth every evening., Disp: , Rfl:  ?  sodium chloride (OCEAN) 0.65 % SOLN nasal spray, Place 1 spray into both nostrils as needed for congestion., Disp: , Rfl:  ?  tetrahydrozoline-zinc (VISINE-AC) 0.05-0.25 % ophthalmic solution, Place 2 drops into both eyes 3 (three) times daily as needed (dry eyes)., Disp: , Rfl:  ?  Triamcinolone Acetonide (NASACORT AQ NA), Place 2 sprays into both nostrils daily as needed (allergies)., Disp: , Rfl:  ?  trolamine salicylate (ASPERCREME) 10  % cream, Apply 1 application topically as needed for muscle pain., Disp: , Rfl:  ?  ibuprofen (ADVIL,MOTRIN) 200 MG tablet, Take 200-400 mg by mouth every 6 (six) hours as needed for moderate pain. (Patient not taking: R

## 2021-10-16 ENCOUNTER — Encounter: Payer: Self-pay | Admitting: Oncology

## 2021-10-28 ENCOUNTER — Inpatient Hospital Stay: Payer: BC Managed Care – PPO

## 2021-10-28 ENCOUNTER — Encounter: Payer: Self-pay | Admitting: Oncology

## 2021-10-28 ENCOUNTER — Inpatient Hospital Stay (HOSPITAL_BASED_OUTPATIENT_CLINIC_OR_DEPARTMENT_OTHER): Payer: BC Managed Care – PPO | Admitting: Oncology

## 2021-10-28 VITALS — BP 145/85 | HR 67 | Temp 97.8°F | Resp 16 | Ht 72.0 in | Wt 238.1 lb

## 2021-10-28 DIAGNOSIS — C8178 Other classical Hodgkin lymphoma, lymph nodes of multiple sites: Secondary | ICD-10-CM

## 2021-10-28 DIAGNOSIS — C8198 Hodgkin lymphoma, unspecified, lymph nodes of multiple sites: Secondary | ICD-10-CM | POA: Diagnosis not present

## 2021-10-28 DIAGNOSIS — Z5112 Encounter for antineoplastic immunotherapy: Secondary | ICD-10-CM | POA: Diagnosis not present

## 2021-10-28 DIAGNOSIS — Z5111 Encounter for antineoplastic chemotherapy: Secondary | ICD-10-CM

## 2021-10-28 DIAGNOSIS — R110 Nausea: Secondary | ICD-10-CM

## 2021-10-28 LAB — COMPREHENSIVE METABOLIC PANEL
ALT: 27 U/L (ref 0–44)
AST: 19 U/L (ref 15–41)
Albumin: 4.2 g/dL (ref 3.5–5.0)
Alkaline Phosphatase: 88 U/L (ref 38–126)
Anion gap: 8 (ref 5–15)
BUN: 14 mg/dL (ref 6–20)
CO2: 24 mmol/L (ref 22–32)
Calcium: 9.3 mg/dL (ref 8.9–10.3)
Chloride: 105 mmol/L (ref 98–111)
Creatinine, Ser: 0.89 mg/dL (ref 0.61–1.24)
GFR, Estimated: >60 mL/min (ref >60)
Glucose, Bld: 117 mg/dL — ABNORMAL HIGH (ref 70–99)
Potassium: 4 mmol/L (ref 3.5–5.1)
Sodium: 137 mmol/L (ref 135–145)
Total Bilirubin: 0.4 mg/dL (ref 0.3–1.2)
Total Protein: 7.1 g/dL (ref 6.5–8.1)

## 2021-10-28 LAB — CBC WITH DIFFERENTIAL/PLATELET
Abs Immature Granulocytes: 0.1 10*3/uL — ABNORMAL HIGH (ref 0.00–0.07)
Basophils Absolute: 0.1 10*3/uL (ref 0.0–0.1)
Basophils Relative: 1 %
Eosinophils Absolute: 0.1 10*3/uL (ref 0.0–0.5)
Eosinophils Relative: 1 %
HCT: 35.2 % — ABNORMAL LOW (ref 39.0–52.0)
Hemoglobin: 11.5 g/dL — ABNORMAL LOW (ref 13.0–17.0)
Immature Granulocytes: 1 %
Lymphocytes Relative: 13 %
Lymphs Abs: 1.5 10*3/uL (ref 0.7–4.0)
MCH: 28.2 pg (ref 26.0–34.0)
MCHC: 32.7 g/dL (ref 30.0–36.0)
MCV: 86.3 fL (ref 80.0–100.0)
Monocytes Absolute: 0.7 10*3/uL (ref 0.1–1.0)
Monocytes Relative: 6 %
Neutro Abs: 8.6 10*3/uL — ABNORMAL HIGH (ref 1.7–7.7)
Neutrophils Relative %: 78 %
Platelets: 266 10*3/uL (ref 150–400)
RBC: 4.08 MIL/uL — ABNORMAL LOW (ref 4.22–5.81)
RDW: 15 % (ref 11.5–15.5)
WBC: 11 10*3/uL — ABNORMAL HIGH (ref 4.0–10.5)
nRBC: 0 % (ref 0.0–0.2)

## 2021-10-28 MED ORDER — DOXORUBICIN HCL CHEMO IV INJECTION 2 MG/ML
25.0000 mg/m2 | Freq: Once | INTRAVENOUS | Status: AC
  Administered 2021-10-28: 60 mg via INTRAVENOUS
  Filled 2021-10-28: qty 30

## 2021-10-28 MED ORDER — SODIUM CHLORIDE 0.9 % IV SOLN
Freq: Once | INTRAVENOUS | Status: AC
  Filled 2021-10-28: qty 250

## 2021-10-28 MED ORDER — SODIUM CHLORIDE 0.9 % IV SOLN
375.0000 mg/m2 | Freq: Once | INTRAVENOUS | Status: AC
  Administered 2021-10-28: 890 mg via INTRAVENOUS
  Filled 2021-10-28: qty 89

## 2021-10-28 MED ORDER — ACETAMINOPHEN 325 MG PO TABS
650.0000 mg | ORAL_TABLET | Freq: Once | ORAL | Status: AC
  Administered 2021-10-28: 650 mg via ORAL
  Filled 2021-10-28: qty 2

## 2021-10-28 MED ORDER — SODIUM CHLORIDE 0.9% FLUSH
10.0000 mL | INTRAVENOUS | Status: DC | PRN
  Filled 2021-10-28: qty 10

## 2021-10-28 MED ORDER — PEGFILGRASTIM 6 MG/0.6ML ~~LOC~~ PSKT
6.0000 mg | PREFILLED_SYRINGE | Freq: Once | SUBCUTANEOUS | Status: AC
  Administered 2021-10-28: 6 mg via SUBCUTANEOUS

## 2021-10-28 MED ORDER — PROCHLORPERAZINE EDISYLATE 10 MG/2ML IJ SOLN
10.0000 mg | Freq: Once | INTRAMUSCULAR | Status: AC
  Administered 2021-10-28: 10 mg via INTRAVENOUS
  Filled 2021-10-28: qty 2

## 2021-10-28 MED ORDER — PEGFILGRASTIM 6 MG/0.6ML ~~LOC~~ PSKT
PREFILLED_SYRINGE | SUBCUTANEOUS | Status: AC
  Filled 2021-10-28: qty 0.6

## 2021-10-28 MED ORDER — DIPHENHYDRAMINE HCL 50 MG/ML IJ SOLN
50.0000 mg | Freq: Once | INTRAMUSCULAR | Status: AC
  Administered 2021-10-28: 50 mg via INTRAVENOUS
  Filled 2021-10-28: qty 1

## 2021-10-28 MED ORDER — HEPARIN SOD (PORK) LOCK FLUSH 100 UNIT/ML IV SOLN
INTRAVENOUS | Status: AC
  Filled 2021-10-28: qty 5

## 2021-10-28 MED ORDER — PALONOSETRON HCL INJECTION 0.25 MG/5ML
0.2500 mg | Freq: Once | INTRAVENOUS | Status: AC
  Administered 2021-10-28: 0.25 mg via INTRAVENOUS
  Filled 2021-10-28: qty 5

## 2021-10-28 MED ORDER — DEXAMETHASONE SODIUM PHOSPHATE 10 MG/ML IJ SOLN
4.0000 mg | Freq: Once | INTRAMUSCULAR | Status: AC
  Administered 2021-10-28: 4 mg via INTRAVENOUS
  Filled 2021-10-28: qty 1

## 2021-10-28 MED ORDER — HEPARIN SOD (PORK) LOCK FLUSH 100 UNIT/ML IV SOLN
500.0000 [IU] | Freq: Once | INTRAVENOUS | Status: AC | PRN
  Administered 2021-10-28: 500 [IU]
  Filled 2021-10-28: qty 5

## 2021-10-28 MED ORDER — SODIUM CHLORIDE 0.9 % IV SOLN
90.0000 mg | Freq: Once | INTRAVENOUS | Status: AC
  Administered 2021-10-28: 90 mg via INTRAVENOUS
  Filled 2021-10-28: qty 18

## 2021-10-28 MED ORDER — VINBLASTINE SULFATE CHEMO INJECTION 1 MG/ML
3.0000 mg/m2 | Freq: Once | INTRAVENOUS | Status: AC
  Administered 2021-10-28: 7.1 mg via INTRAVENOUS
  Filled 2021-10-28: qty 7.1

## 2021-10-28 MED ORDER — SODIUM CHLORIDE 0.9 % IV SOLN
150.0000 mg | Freq: Once | INTRAVENOUS | Status: AC
  Administered 2021-10-28: 150 mg via INTRAVENOUS
  Filled 2021-10-28: qty 150

## 2021-10-28 NOTE — Progress Notes (Signed)
Patient here for pre-treatment check elevated  ?BP 145/85. He report loose stool which are resolved with immodium. ?

## 2021-10-28 NOTE — Patient Instructions (Addendum)
[facility]CER CTR AT [place]  Discharge Instructions: ?Thank you for choosing [facility] to provide your oncology and hematology care.  ?If you have a lab appointment with the [facility], please go directly to the [facility] and check in at the registration area. ? ?Wear comfortable clothing and clothing appropriate for easy access to any Portacath or PICC line.  ? ?We strive to give you quality time with your provider. You may need to reschedule your appointment if you arrive late (15 or more minutes).  Arriving late affects you and other patients whose appointments are after yours.  Also, if you miss three or more appointments without notifying the office, you may be dismissed from the clinic at the provider?s discretion.    ?  ?For prescription refill requests, have your pharmacy contact our office and allow 72 hours for refills to be completed.   ? ?Today you received the following chemotherapy and/or immunotherapy agents doxorubicin, brentuximab, dacarbazine, vinblastine    ?  ?To help prevent nausea and vomiting after your treatment, we encourage you to take your nausea medication as directed. ? ?BELOW ARE SYMPTOMS THAT SHOULD BE REPORTED IMMEDIATELY: ?*FEVER GREATER THAN 100.4 F (38 ?C) OR HIGHER ?*CHILLS OR SWEATING ?*NAUSEA AND VOMITING THAT IS NOT CONTROLLED WITH YOUR NAUSEA MEDICATION ?*UNUSUAL SHORTNESS OF BREATH ?*UNUSUAL BRUISING OR BLEEDING ?*URINARY PROBLEMS (pain or burning when urinating, or frequent urination) ?*BOWEL PROBLEMS (unusual diarrhea, constipation, pain near the anus) ?TENDERNESS IN MOUTH AND THROAT WITH OR WITHOUT PRESENCE OF ULCERS (sore throat, sores in mouth, or a toothache) ?UNUSUAL RASH, SWELLING OR PAIN  ?UNUSUAL VAGINAL DISCHARGE OR ITCHING  ? ?Items with * indicate a potential emergency and should be followed up as soon as possible or go to the Emergency Department if any problems should occur. ? ?Please show the CHEMOTHERAPY ALERT CARD or  IMMUNOTHERAPY ALERT CARD at check-in to the Emergency Department and triage nurse. ? ?Should you have questions after your visit or need to cancel or reschedule your appointment, please contact [facility] CANCER [place] AT [place]  [phone] and follow the prompts.  Office hours are 8:00 a.m. to 4:30 p.m. Monday - Friday. Please note that voicemails left after 4:00 p.m. may not be returned until the following business day.  We are closed weekends and major holidays. You have access to a nurse at all times for urgent questions. Please call the main number to the clinic [phone] and follow the prompts. ? ?For any non-urgent questions, you may also contact your provider using MyChart. We now offer e-Visits for anyone 73 and older to request care online for non-urgent symptoms. For details visit [email or web address]. ?  ?Also download the MyChart app! Go to the app store, search "MyChart", open the app, select [facility], and log in with your MyChart username and password. ? ?Due to Covid, a mask is required upon entering the hospital/clinic. If you do not have a mask, one will be given to you upon arrival. For doctor visits, patients may have 1 support person aged 74 or older with them. For treatment visits, patients cannot have anyone with them due to current Covid guidelines and our immunocompromised population. ? ? ?Doxorubicin injection ?What is this medication? ?DOXORUBICIN (dox oh ROO bi sin) is a chemotherapy drug. It is used to treat many kinds of cancer like leukemia, lymphoma, neuroblastoma, sarcoma, and Wilms' tumor. It is also used to treat bladder cancer, breast cancer, lung cancer, ovarian cancer, stomach cancer, and thyroid cancer. ?  This medicine may be used for other purposes; ask your health care provider or pharmacist if you have questions. ?COMMON BRAND NAME(S): Adriamycin, Adriamycin PFS, Adriamycin RDF, Rubex ?What should I tell my care team before I take this medication? ?They  need to know if you have any of these conditions: ?heart disease ?history of low blood counts caused by a medicine ?liver disease ?recent or ongoing radiation therapy ?an unusual or allergic reaction to doxorubicin, other chemotherapy agents, other medicines, foods, dyes, or preservatives ?pregnant or trying to get pregnant ?breast-feeding ?How should I use this medication? ?This drug is given as an infusion into a vein. It is administered in a hospital or clinic by a specially trained health care professional. If you have pain, swelling, burning or any unusual feeling around the site of your injection, tell your health care professional right away. ?Talk to your pediatrician regarding the use of this medicine in children. Special care may be needed. ?Overdosage: If you think you have taken too much of this medicine contact a poison control center or emergency room at once. ?NOTE: This medicine is only for you. Do not share this medicine with others. ?What if I miss a dose? ?It is important not to miss your dose. Call your doctor or health care professional if you are unable to keep an appointment. ?What may interact with this medication? ?This medicine may interact with the following medications: ?6-mercaptopurine ?paclitaxel ?phenytoin ?St. John's Wort ?trastuzumab ?verapamil ?This list may not describe all possible interactions. Give your health care provider a list of all the medicines, herbs, non-prescription drugs, or dietary supplements you use. Also tell them if you smoke, drink alcohol, or use illegal drugs. Some items may interact with your medicine. ?What should I watch for while using this medication? ?This drug may make you feel generally unwell. This is not uncommon, as chemotherapy can affect healthy cells as well as cancer cells. Report any side effects. Continue your course of treatment even though you feel ill unless your doctor tells you to stop. ?There is a maximum amount of this medicine you  should receive throughout your life. The amount depends on the medical condition being treated and your overall health. Your doctor will watch how much of this medicine you receive in your lifetime. Tell your doctor if you have taken this medicine before. ?You may need blood work done while you are taking this medicine. ?Your urine may turn red for a few days after your dose. This is not blood. If your urine is dark or brown, call your doctor. ?In some cases, you may be given additional medicines to help with side effects. Follow all directions for their use. ?Call your doctor or health care professional for advice if you get a fever, chills or sore throat, or other symptoms of a cold or flu. Do not treat yourself. This drug decreases your body's ability to fight infections. Try to avoid being around people who are sick. ?This medicine may increase your risk to bruise or bleed. Call your doctor or health care professional if you notice any unusual bleeding. ?Talk to your doctor about your risk of cancer. You may be more at risk for certain types of cancers if you take this medicine. ?Do not become pregnant while taking this medicine or for 6 months after stopping it. Women should inform their doctor if they wish to become pregnant or think they might be pregnant. Men should not father a child while taking this medicine and for  6 months after stopping it. There is a potential for serious side effects to an unborn child. Talk to your health care professional or pharmacist for more information. Do not breast-feed an infant while taking this medicine. ?This medicine has caused ovarian failure in some women and reduced sperm counts in some men This medicine may interfere with the ability to have a child. Talk with your doctor or health care professional if you are concerned about your fertility. ?This medicine may cause a decrease in Co-Enzyme Q-10. You should make sure that you get enough Co-Enzyme Q-10 while you are  taking this medicine. Discuss the foods you eat and the vitamins you take with your health care professional. ?What side effects may I notice from receiving this medication? ?Side effects that you should re

## 2021-10-28 NOTE — Progress Notes (Signed)
? ? ? ?Hematology/Oncology Consult note ?[place]  ?[phone] [phone] ? ?Patient Care Team: ?Maryland Pink, MD as PCP - General (Family Medicine) ?Sindy Guadeloupe, MD as Consulting Physician (Oncology)  ? ?Name of the patient: Erik Buchanan  ?[id number]  ?June 21, 1961  ? ?Date of visit: 10/28/21 ? ?Diagnosis- stage III classical Hodgkin's lymphoma ? ?Chief complaint/ Reason for visit-on treatment assessment prior to cycle 6-day 1 of BV AVD chemotherapy ? ?Heme/Onc history: patient is a 61 year old male with a past medical history significant for hypertension and diabetes among other medical problems.  He was noted to have left-sided neck swelling which has been ongoing for the last 4 months but he feels that the swelling has slowly increased in size.  He was seen by Dr. Kary Kos and underwent CT chest and CT soft tissue neck after initial ultrasound of the neck.  CT showed bulky supraclavicular adenopathy with the largest node measuring 4.1 cm CT chest with contrast also showed similar findings and no evidence of right supraclavicular axillary mediastinal or hilar adenopathy.  Lungs were clear.  Multiple hypodense lesions in the spleen which nearly replaces the splenic parenchyma.  Spleen however does not appear enlarged.  ?  ?PET CT scan showed FDG avid adenopathy within the lower left neck to left supraclavicular region as well as small bowel mesentery peritoneum and porta hepatic lesions.  Multiple FDG avid splenic lesions.  Mild diffuse increased uptake in the bone marrow nonspecific.  No focal areas of increased uptake to suggest bone metastases. ?  ?Excisional supraclavicular lymph node biopsy showed classical Hodgkin's lymphoma. The H&E stained sections reveal lymph node effaced by a somewhat nodular proliferation of large, abnormal lymphoid cells, including "mummified" cells, "lacunar" cells, and occasional binucleated cells with classic Reed-Sternberg  morphology, in a background of predominantly small lymphocytes.  EBV negative ?  ?Stage III classical Hodgkin's lymphoma IPI score 2.5-year freedom from progression of 80% and 91% overall survival. ?  ? ?Interval history-peripheral neuropathy in his hands and feet are overall stable.  He has some baseline fatigue from chemotherapy which is remained stable.  Had mild self-limited diarrhea.  Denies other complaints at this time ? ?ECOG PS- 1 ?Pain scale- 0 ? ? ?Review of systems- Review of Systems  ?Constitutional:  Positive for malaise/fatigue.  ?Neurological:  Positive for sensory change (Peripheral neuropathy).   ? ? ?Allergies  ?Allergen Reactions  ? Ultram [Tramadol Hcl] Other (See Comments)  ?  vertigo and passed out  ? ? ? ?Past Medical History:  ?Diagnosis Date  ? Anemia   ? Colon polyps   ? Depression   ? Diabetes mellitus without complication ([place])   ? GERD (gastroesophageal reflux disease)   ? Hemorrhoids   ? Hodgkin's lymphoma ([place])   ? Hyperlipidemia   ? Hypertension   ? Pneumonia   ? Testicular hypofunction   ? Tinnitus   ? ? ? ?Past Surgical History:  ?Procedure Laterality Date  ? COLONOSCOPY WITH PROPOFOL N/A 04/07/2015  ? Procedure: COLONOSCOPY WITH PROPOFOL;  Surgeon: Hulen Luster, MD;  Location: [facility] ENDOSCOPY;  Service: Gastroenterology;  Laterality: N/A;  ? COLONOSCOPY WITH PROPOFOL N/A 09/01/2020  ? Procedure: COLONOSCOPY WITH PROPOFOL;  Surgeon: Toledo, Benay Pike, MD;  Location: [facility] ENDOSCOPY;  Service: Gastroenterology;  Laterality: N/A;  ? EYE SURGERY    ? cataract  ? MASS BIOPSY Left 05/07/2021  ? Procedure: NECK MASS BIOPSY,;  Surgeon: Jules Husbands, MD;  Location: [facility] ORS;  Service: General;  Laterality: Left;  Provider requesting 1.5 hours / 90 minutes for procedure.  ? PORTACATH PLACEMENT Right 05/21/2021  ? Procedure: INSERTION PORT-A-CATH;  Surgeon: Jules Husbands, MD;  Location: [facility] ORS;  Service: General;  Laterality: Right;  ? ? ?Social History  ? ?Socioeconomic History  ? Marital  status: Married  ?  Spouse name: [name]  ? Number of children: Not on file  ? Years of education: Not on file  ? Highest education level: Not on file  ?Occupational History  ? Not on file  ?Tobacco Use  ? Smoking status: Never  ? Smokeless tobacco: Never  ?Vaping Use  ? Vaping Use: Never used  ?Substance and Sexual Activity  ? Alcohol use: Never  ? Drug use: Never  ? Sexual activity: Yes  ?Other Topics Concern  ? Not on file  ?Social History Narrative  ? Not on file  ? ?Social Determinants of Health  ? ?Financial Resource Strain: Not on file  ?Food Insecurity: Not on file  ?Transportation Needs: Not on file  ?Physical Activity: Not on file  ?Stress: Not on file  ?Social Connections: Not on file  ?Intimate Partner Violence: Not on file  ? ? ?Family History  ?Problem Relation Age of Onset  ? Arthritis Mother   ? Stroke Paternal Aunt   ? ? ? ?Current Outpatient Medications:  ?  amLODipine (NORVASC) 5 MG tablet, Take 7.5 mg by mouth daily., Disp: , Rfl:  ?  buPROPion (WELLBUTRIN XL) 150 MG 24 hr tablet, Take 450 mg by mouth daily., Disp: , Rfl:  ?  dexamethasone (DECADRON) 4 MG tablet, Take 2 tablets by mouth once a day for 3 days after chemo. Take with food., Disp: 30 tablet, Rfl: 1 ?  glipiZIDE (GLUCOTROL XL) 5 MG 24 hr tablet, Take 10 mg by mouth 2 (two) times daily., Disp: , Rfl:  ?  Homeopathic Products (LEG CRAMPS) TABS, Take by mouth as needed. Pt taking for leg cramps., Disp: , Rfl:  ?  ibuprofen (ADVIL,MOTRIN) 200 MG tablet, Take 200-400 mg by mouth every 6 (six) hours as needed for moderate pain., Disp: , Rfl:  ?  insulin glargine (LANTUS) 100 UNIT/ML injection, Inject 15 Units into the skin daily. At evening, Disp: , Rfl:  ?  LEVEMIR 100 UNIT/ML injection, Inject 15 Units into the skin every evening., Disp: , Rfl:  ?  lidocaine-prilocaine (EMLA) cream, Apply to affected area once, Disp: 30 g, Rfl: 3 ?  lisinopril (PRINIVIL,ZESTRIL) 10 MG tablet, Take 10 mg by mouth daily., Disp: , Rfl:  ?  loratadine  (CLARITIN) 10 MG tablet, Take 10 mg by mouth daily as needed for allergies., Disp: , Rfl:  ?  LORazepam (ATIVAN) 0.5 MG tablet, Take 1 tablet (0.5 mg total) by mouth every 6 (six) hours as needed (Nausea or vomiting)., Disp: 30 tablet, Rfl: 0 ?  metFORMIN (GLUCOPHAGE) 500 MG tablet, Take 1,000 mg by mouth 2 (two) times daily., Disp: , Rfl:  ?  Multiple Vitamin (MULTIVITAMIN PO), Take 1 Package by mouth daily., Disp: , Rfl:  ?  ondansetron (ZOFRAN) 8 MG tablet, Take 1 tablet (8 mg total) by mouth 2 (two) times daily as needed. Start on the third day after chemotherapy., Disp: 30 tablet, Rfl: 1 ?  oxyCODONE (OXY IR/ROXICODONE) 5 MG immediate release tablet, Take 1 tablet (5 mg total) by mouth every 4 (four) hours as needed for severe pain., Disp: 30 tablet, Rfl: 0 ?  pantoprazole (PROTONIX) 20 MG tablet, Take 1 tablet by  mouth once daily (Patient taking differently: 20 mg as needed.), Disp: 30 tablet, Rfl: 0 ?  pregabalin (LYRICA) 75 MG capsule, Take 1 capsule (75 mg total) by mouth 2 (two) times daily., Disp: 56 capsule, Rfl: 2 ?  prochlorperazine (COMPAZINE) 10 MG tablet, Take 10 mg by mouth every 6 (six) hours as needed., Disp: , Rfl:  ?  simvastatin (ZOCOR) 20 MG tablet, Take 20 mg by mouth every evening., Disp: , Rfl:  ?  sodium chloride (OCEAN) 0.65 % SOLN nasal spray, Place 1 spray into both nostrils as needed for congestion., Disp: , Rfl:  ?  Testosterone 1.62 % GEL, Apply 2 Pump topically in the morning. Applied to shoulders/upper arms, Disp: , Rfl:  ?  tetrahydrozoline-zinc (VISINE-AC) 0.05-0.25 % ophthalmic solution, Place 2 drops into both eyes 3 (three) times daily as needed (dry eyes)., Disp: , Rfl:  ?  Triamcinolone Acetonide (NASACORT AQ NA), Place 2 sprays into both nostrils daily as needed (allergies)., Disp: , Rfl:  ?  trolamine salicylate (ASPERCREME) 10 % cream, Apply 1 application topically as needed for muscle pain., Disp: , Rfl:  ?No current facility-administered medications for this  visit. ? ?Facility-Administered Medications Ordered in Other Visits:  ?  0.9 %  sodium chloride infusion, , Intravenous, Once, Sindy Guadeloupe, MD ?  acetaminophen (TYLENOL) tablet 650 mg, 650 mg, Oral, Once, Sindy Guadeloupe, MD ?

## 2021-10-28 NOTE — Progress Notes (Signed)
Pt tolerated all infusions well today. Pt did complain of nausea at the end of his dacarbazine infusion, given compazine 10 mg per Moishe Spice RN per Dr. Janese Banks.  Pt left infusion suite stable and ambulatory with his neulasta on pro in place as ordered.  ?

## 2021-11-10 MED FILL — Fosaprepitant Dimeglumine For IV Infusion 150 MG (Base Eq): INTRAVENOUS | Qty: 5 | Status: AC

## 2021-11-11 ENCOUNTER — Encounter: Payer: Self-pay | Admitting: Oncology

## 2021-11-11 ENCOUNTER — Inpatient Hospital Stay: Payer: BC Managed Care – PPO

## 2021-11-11 ENCOUNTER — Other Ambulatory Visit: Payer: Self-pay | Admitting: *Deleted

## 2021-11-11 ENCOUNTER — Inpatient Hospital Stay (HOSPITAL_BASED_OUTPATIENT_CLINIC_OR_DEPARTMENT_OTHER): Payer: BC Managed Care – PPO | Admitting: Oncology

## 2021-11-11 ENCOUNTER — Inpatient Hospital Stay: Payer: BC Managed Care – PPO | Attending: Oncology

## 2021-11-11 VITALS — BP 152/82 | HR 68 | Temp 97.0°F | Resp 20 | Wt 242.7 lb

## 2021-11-11 DIAGNOSIS — G620 Drug-induced polyneuropathy: Secondary | ICD-10-CM

## 2021-11-11 DIAGNOSIS — Z9221 Personal history of antineoplastic chemotherapy: Secondary | ICD-10-CM | POA: Insufficient documentation

## 2021-11-11 DIAGNOSIS — T451X5A Adverse effect of antineoplastic and immunosuppressive drugs, initial encounter: Secondary | ICD-10-CM

## 2021-11-11 DIAGNOSIS — C8173 Other classical Hodgkin lymphoma, intra-abdominal lymph nodes: Secondary | ICD-10-CM

## 2021-11-11 DIAGNOSIS — C8198 Hodgkin lymphoma, unspecified, lymph nodes of multiple sites: Secondary | ICD-10-CM | POA: Insufficient documentation

## 2021-11-11 DIAGNOSIS — C8178 Other classical Hodgkin lymphoma, lymph nodes of multiple sites: Secondary | ICD-10-CM

## 2021-11-11 LAB — CBC WITH DIFFERENTIAL/PLATELET
Abs Immature Granulocytes: 0.08 10*3/uL — ABNORMAL HIGH (ref 0.00–0.07)
Basophils Absolute: 0 10*3/uL (ref 0.0–0.1)
Basophils Relative: 0 %
Eosinophils Absolute: 0.2 10*3/uL (ref 0.0–0.5)
Eosinophils Relative: 2 %
HCT: 34 % — ABNORMAL LOW (ref 39.0–52.0)
Hemoglobin: 11.4 g/dL — ABNORMAL LOW (ref 13.0–17.0)
Immature Granulocytes: 1 %
Lymphocytes Relative: 12 %
Lymphs Abs: 1.2 10*3/uL (ref 0.7–4.0)
MCH: 28.5 pg (ref 26.0–34.0)
MCHC: 33.5 g/dL (ref 30.0–36.0)
MCV: 85 fL (ref 80.0–100.0)
Monocytes Absolute: 0.7 10*3/uL (ref 0.1–1.0)
Monocytes Relative: 7 %
Neutro Abs: 7.5 10*3/uL (ref 1.7–7.7)
Neutrophils Relative %: 78 %
Platelets: 294 10*3/uL (ref 150–400)
RBC: 4 MIL/uL — ABNORMAL LOW (ref 4.22–5.81)
RDW: 14.8 % (ref 11.5–15.5)
WBC: 9.7 10*3/uL (ref 4.0–10.5)
nRBC: 0 % (ref 0.0–0.2)

## 2021-11-11 LAB — COMPREHENSIVE METABOLIC PANEL
ALT: 28 U/L (ref 0–44)
AST: 19 U/L (ref 15–41)
Albumin: 4.2 g/dL (ref 3.5–5.0)
Alkaline Phosphatase: 75 U/L (ref 38–126)
Anion gap: 8 (ref 5–15)
BUN: 18 mg/dL (ref 6–20)
CO2: 25 mmol/L (ref 22–32)
Calcium: 9 mg/dL (ref 8.9–10.3)
Chloride: 103 mmol/L (ref 98–111)
Creatinine, Ser: 0.93 mg/dL (ref 0.61–1.24)
GFR, Estimated: >60 mL/min (ref >60)
Glucose, Bld: 168 mg/dL — ABNORMAL HIGH (ref 70–99)
Potassium: 3.9 mmol/L (ref 3.5–5.1)
Sodium: 136 mmol/L (ref 135–145)
Total Bilirubin: 0.3 mg/dL (ref 0.3–1.2)
Total Protein: 6.9 g/dL (ref 6.5–8.1)

## 2021-11-11 MED ORDER — PREGABALIN 75 MG PO CAPS: 75.0000 mg | ORAL_CAPSULE | Freq: Two times a day (BID) | ORAL | 2 refills | Status: DC

## 2021-11-11 MED ORDER — SODIUM CHLORIDE 0.9% FLUSH
10.0000 mL | INTRAVENOUS | Status: AC | PRN
  Administered 2021-11-11: 10 mL via INTRAVENOUS
  Filled 2021-11-11: qty 10

## 2021-11-11 MED ORDER — HEPARIN SOD (PORK) LOCK FLUSH 100 UNIT/ML IV SOLN
500.0000 [IU] | Freq: Once | INTRAVENOUS | Status: AC
  Administered 2021-11-11: 500 [IU] via INTRAVENOUS
  Filled 2021-11-11: qty 5

## 2021-11-11 NOTE — Progress Notes (Signed)
? ? ? ?Hematology/Oncology Consult note ?[place]  ?Telephone:([phone]) [phone] [phone] ? ?Patient Care Team: ?Maryland Pink, MD as PCP - General (Family Medicine) ?Sindy Guadeloupe, MD as Consulting Physician (Oncology)  ? ?Name of the patient: Erik Buchanan  ?[id number]  ?Aug 19, 1960  ? ?Date of visit: 11/11/21 ? ?Diagnosis- stage III classical Hodgkin's lymphoma ?  ? ?Chief complaint/ Reason for visit-on treatment assessment prior to cycle 6-day 15 of BV AVD chemotherapy ? ?Heme/Onc history:  patient is a 61 year old male with a past medical history significant for hypertension and diabetes among other medical problems.  He was noted to have left-sided neck swelling which has been ongoing for the last 4 months but he feels that the swelling has slowly increased in size.  He was seen by Dr. Kary Kos and underwent CT chest and CT soft tissue neck after initial ultrasound of the neck.  CT showed bulky supraclavicular adenopathy with the largest node measuring 4.1 cm CT chest with contrast also showed similar findings and no evidence of right supraclavicular axillary mediastinal or hilar adenopathy.  Lungs were clear.  Multiple hypodense lesions in the spleen which nearly replaces the splenic parenchyma.  Spleen however does not appear enlarged.  ?  ?PET CT scan showed FDG avid adenopathy within the lower left neck to left supraclavicular region as well as small bowel mesentery peritoneum and porta hepatic lesions.  Multiple FDG avid splenic lesions.  Mild diffuse increased uptake in the bone marrow nonspecific.  No focal areas of increased uptake to suggest bone metastases. ?  ?Excisional supraclavicular lymph node biopsy showed classical Hodgkin's lymphoma. The H&E stained sections reveal lymph node effaced by a somewhat nodular proliferation of large, abnormal lymphoid cells, including "mummified" cells, "lacunar" cells, and occasional binucleated cells with classic Reed-Sternberg  morphology, in a background of predominantly small lymphocytes.  EBV negative ?  ?Stage III classical Hodgkin's lymphoma IPI score 2.5-year freedom from progression of 80% and 91% overall survival. ?  ? ?Interval history-patient states that his neuropathy is getting worse in his hands and feet.  He was unable to open a bottle the other day as he felt that he did not have sufficient strength in his hands to do it. ? ?ECOG PS- 1 ?Pain scale- 0 ? ? ?Review of systems- Review of Systems  ?Constitutional:  Positive for malaise/fatigue. Negative for chills, fever and weight loss.  ?HENT:  Negative for congestion, ear discharge and nosebleeds.   ?Eyes:  Negative for blurred vision.  ?Respiratory:  Negative for cough, hemoptysis, sputum production, shortness of breath and wheezing.   ?Cardiovascular:  Negative for chest pain, palpitations, orthopnea and claudication.  ?Gastrointestinal:  Negative for abdominal pain, blood in stool, constipation, diarrhea, heartburn, melena, nausea and vomiting.  ?Genitourinary:  Negative for dysuria, flank pain, frequency, hematuria and urgency.  ?Musculoskeletal:  Negative for back pain, joint pain and myalgias.  ?Skin:  Negative for rash.  ?Neurological:  Positive for sensory change (Peripheral neuropathy). Negative for dizziness, tingling, focal weakness, seizures, weakness and headaches.  ?Endo/Heme/Allergies:  Does not bruise/bleed easily.  ?Psychiatric/Behavioral:  Negative for depression and suicidal ideas. The patient does not have insomnia.    ? ? ? ?Allergies  ?Allergen Reactions  ? Ultram [Tramadol Hcl] Other (See Comments)  ?  vertigo and passed out  ? ? ? ?Past Medical History:  ?Diagnosis Date  ? Anemia   ? Colon polyps   ? Depression   ? Diabetes mellitus without complication ([place])   ?  GERD (gastroesophageal reflux disease)   ? Hemorrhoids   ? Hodgkin's lymphoma ([place])   ? Hyperlipidemia   ? Hypertension   ? Pneumonia   ? Testicular hypofunction   ? Tinnitus   ? ? ? ?Past  Surgical History:  ?Procedure Laterality Date  ? COLONOSCOPY WITH PROPOFOL N/A 04/07/2015  ? Procedure: COLONOSCOPY WITH PROPOFOL;  Surgeon: Hulen Luster, MD;  Location: [facility] ENDOSCOPY;  Service: Gastroenterology;  Laterality: N/A;  ? COLONOSCOPY WITH PROPOFOL N/A 09/01/2020  ? Procedure: COLONOSCOPY WITH PROPOFOL;  Surgeon: Toledo, Benay Pike, MD;  Location: [facility] ENDOSCOPY;  Service: Gastroenterology;  Laterality: N/A;  ? EYE SURGERY    ? cataract  ? MASS BIOPSY Left 05/07/2021  ? Procedure: NECK MASS BIOPSY,;  Surgeon: Jules Husbands, MD;  Location: [facility] ORS;  Service: General;  Laterality: Left;  Provider requesting 1.5 hours / 90 minutes for procedure.  ? PORTACATH PLACEMENT Right 05/21/2021  ? Procedure: INSERTION PORT-A-CATH;  Surgeon: Jules Husbands, MD;  Location: [facility] ORS;  Service: General;  Laterality: Right;  ? ? ?Social History  ? ?Socioeconomic History  ? Marital status: Married  ?  Spouse name: [name]  ? Number of children: Not on file  ? Years of education: Not on file  ? Highest education level: Not on file  ?Occupational History  ? Not on file  ?Tobacco Use  ? Smoking status: Never  ? Smokeless tobacco: Never  ?Vaping Use  ? Vaping Use: Never used  ?Substance and Sexual Activity  ? Alcohol use: Never  ? Drug use: Never  ? Sexual activity: Yes  ?Other Topics Concern  ? Not on file  ?Social History Narrative  ? Not on file  ? ?Social Determinants of Health  ? ?Financial Resource Strain: Not on file  ?Food Insecurity: Not on file  ?Transportation Needs: Not on file  ?Physical Activity: Not on file  ?Stress: Not on file  ?Social Connections: Not on file  ?Intimate Partner Violence: Not on file  ? ? ?Family History  ?Problem Relation Age of Onset  ? Arthritis Mother   ? Stroke Paternal Aunt   ? ? ? ?Current Outpatient Medications:  ?  amLODipine (NORVASC) 5 MG tablet, Take 7.5 mg by mouth daily., Disp: , Rfl:  ?  buPROPion (WELLBUTRIN XL) 150 MG 24 hr tablet, Take 450 mg by mouth daily., Disp: , Rfl:  ?   dexamethasone (DECADRON) 4 MG tablet, Take 2 tablets by mouth once a day for 3 days after chemo. Take with food., Disp: 30 tablet, Rfl: 1 ?  glipiZIDE (GLUCOTROL XL) 5 MG 24 hr tablet, Take 10 mg by mouth 2 (two) times daily., Disp: , Rfl:  ?  ibuprofen (ADVIL,MOTRIN) 200 MG tablet, Take 200-400 mg by mouth every 6 (six) hours as needed for moderate pain., Disp: , Rfl:  ?  insulin glargine (LANTUS) 100 UNIT/ML injection, Inject 15 Units into the skin daily. At evening, Disp: , Rfl:  ?  lisinopril (PRINIVIL,ZESTRIL) 10 MG tablet, Take 10 mg by mouth daily., Disp: , Rfl:  ?  loratadine (CLARITIN) 10 MG tablet, Take 10 mg by mouth daily as needed for allergies., Disp: , Rfl:  ?  LORazepam (ATIVAN) 0.5 MG tablet, Take 1 tablet (0.5 mg total) by mouth every 6 (six) hours as needed (Nausea or vomiting)., Disp: 30 tablet, Rfl: 0 ?  metFORMIN (GLUCOPHAGE) 500 MG tablet, Take 1,000 mg by mouth 2 (two) times daily., Disp: , Rfl:  ?  Multiple Vitamin (MULTIVITAMIN PO),  Take 1 Package by mouth daily., Disp: , Rfl:  ?  prochlorperazine (COMPAZINE) 10 MG tablet, Take 10 mg by mouth every 6 (six) hours as needed., Disp: , Rfl:  ?  simvastatin (ZOCOR) 20 MG tablet, Take 20 mg by mouth every evening., Disp: , Rfl:  ?  Triamcinolone Acetonide (NASACORT AQ NA), Place 2 sprays into both nostrils daily as needed (allergies)., Disp: , Rfl:  ?  Homeopathic Products (LEG CRAMPS) TABS, Take by mouth as needed. Pt taking for leg cramps. (Patient not taking: Reported on 11/11/2021), Disp: , Rfl:  ?  LEVEMIR 100 UNIT/ML injection, Inject 15 Units into the skin every evening., Disp: , Rfl:  ?  lidocaine-prilocaine (EMLA) cream, Apply to affected area once, Disp: 30 g, Rfl: 3 ?  ondansetron (ZOFRAN) 8 MG tablet, Take 1 tablet (8 mg total) by mouth 2 (two) times daily as needed. Start on the third day after chemotherapy. (Patient not taking: Reported on 11/11/2021), Disp: 30 tablet, Rfl: 1 ?  oxyCODONE (OXY IR/ROXICODONE) 5 MG immediate release  tablet, Take 1 tablet (5 mg total) by mouth every 4 (four) hours as needed for severe pain. (Patient not taking: Reported on 11/11/2021), Disp: 30 tablet, Rfl: 0 ?  pantoprazole (PROTONIX) 20 MG tablet, Take 1 tablet

## 2021-11-11 NOTE — Progress Notes (Signed)
Pt states that the numbness in his left foot is getting worse along with his fingers.  ?

## 2021-11-11 NOTE — Telephone Encounter (Signed)
Send med for md to sign lyrica ?

## 2021-11-18 ENCOUNTER — Telehealth: Payer: Self-pay

## 2021-11-18 NOTE — Telephone Encounter (Signed)
[employer] PA for Lyrica has been sent.  ?

## 2021-11-18 NOTE — Telephone Encounter (Signed)
-----   Message from Secundino Ginger sent at 11/17/2021  1:32 PM EDT ----- ?Regarding: PRIOR AUTHORIZATION- [facility] [facility] ?PRIOR AUTHORIZATION- [facility] [facility] sent to chart for Pregabalin. ? ?

## 2021-12-03 ENCOUNTER — Ambulatory Visit
Admission: RE | Admit: 2021-12-03 | Discharge: 2021-12-03 | Disposition: A | Discharge status: Home / Self Care | Payer: BC Managed Care – PPO | Source: Ambulatory Visit | Attending: Family Medicine | Admitting: Family Medicine

## 2021-12-03 ENCOUNTER — Other Ambulatory Visit: Payer: BC Managed Care – PPO

## 2021-12-03 ENCOUNTER — Ambulatory Visit (HOSPITAL_COMMUNITY): Payer: BC Managed Care – PPO

## 2021-12-03 DIAGNOSIS — C8178 Other classical Hodgkin lymphoma, lymph nodes of multiple sites: Secondary | ICD-10-CM | POA: Diagnosis not present

## 2021-12-03 DIAGNOSIS — Z9221 Personal history of antineoplastic chemotherapy: Secondary | ICD-10-CM | POA: Diagnosis not present

## 2021-12-03 DIAGNOSIS — E119 Type 2 diabetes mellitus without complications: Secondary | ICD-10-CM | POA: Diagnosis not present

## 2021-12-03 DIAGNOSIS — C8590 Non-Hodgkin lymphoma, unspecified, unspecified site: Secondary | ICD-10-CM | POA: Diagnosis present

## 2021-12-03 DIAGNOSIS — R590 Localized enlarged lymph nodes: Secondary | ICD-10-CM | POA: Insufficient documentation

## 2021-12-03 MED ORDER — FLUDEOXYGLUCOSE F - 18 (FDG) INJECTION
13.4800 | Freq: Once | INTRAVENOUS | Status: AC | PRN
  Administered 2021-12-03: 13.48 via INTRAVENOUS

## 2021-12-04 LAB — GLUCOSE, CAPILLARY: Glucose-Capillary: 91 mg/dL (ref 70–99)

## 2021-12-08 ENCOUNTER — Encounter: Payer: Self-pay | Admitting: Oncology

## 2021-12-08 ENCOUNTER — Inpatient Hospital Stay (HOSPITAL_BASED_OUTPATIENT_CLINIC_OR_DEPARTMENT_OTHER): Payer: BC Managed Care – PPO | Admitting: Oncology

## 2021-12-08 VITALS — BP 160/79 | HR 71 | Temp 96.9°F | Resp 18 | Wt 245.0 lb

## 2021-12-08 DIAGNOSIS — Z9889 Other specified postprocedural states: Secondary | ICD-10-CM | POA: Diagnosis not present

## 2021-12-08 DIAGNOSIS — C8198 Hodgkin lymphoma, unspecified, lymph nodes of multiple sites: Secondary | ICD-10-CM | POA: Diagnosis not present

## 2021-12-08 DIAGNOSIS — C8178 Other classical Hodgkin lymphoma, lymph nodes of multiple sites: Secondary | ICD-10-CM | POA: Diagnosis not present

## 2021-12-11 ENCOUNTER — Encounter: Payer: Self-pay | Admitting: Oncology

## 2021-12-11 NOTE — Progress Notes (Signed)
Hematology/Oncology Consult note [facility]  Telephone:([phone] Fax:([phone]  Patient Care Team: [place] Pink, MD as PCP - General (Family Medicine) Sindy Guadeloupe, MD as Consulting Physician (Oncology)   Name of the patient: Erik Buchanan  [id number]  01-04-1961   Date of visit: 12/11/21  Diagnosis- stage III classical Hodgkin's lymphoma  Chief complaint/ Reason for visit-discuss PET CT scan results and further management  Heme/Onc history:  patient is a 61 year old male with a past medical history significant for hypertension and diabetes among other medical problems.  He was noted to have left-sided neck swelling which has been ongoing for the last 4 months but he feels that the swelling has slowly increased in size.  He was seen by Dr. Kary Kos and underwent CT chest and CT soft tissue neck after initial ultrasound of the neck.  CT showed bulky supraclavicular adenopathy with the largest node measuring 4.1 cm CT chest with contrast also showed similar findings and no evidence of right supraclavicular axillary mediastinal or hilar adenopathy.  Lungs were clear.  Multiple hypodense lesions in the spleen which nearly replaces the splenic parenchyma.  Spleen however does not appear enlarged.    PET CT scan showed FDG avid adenopathy within the lower left neck to left supraclavicular region as well as small bowel mesentery peritoneum and porta hepatic lesions.  Multiple FDG avid splenic lesions.  Mild diffuse increased uptake in the bone marrow nonspecific.  No focal areas of increased uptake to suggest bone metastases.   Excisional supraclavicular lymph node biopsy showed classical Hodgkin's lymphoma. The H&E stained sections reveal lymph node effaced by a somewhat nodular proliferation of large, abnormal lymphoid cells, including "mummified" cells, "lacunar" cells, and occasional binucleated cells with classic Reed-Sternberg morphology, in a  background of predominantly small lymphocytes.  EBV negative   Stage III classical Hodgkin's lymphoma IPI score 2.5-year freedom from progression of 80% and 91% overall survival.  Patient completed 6 cycles of BV AVD chemotherapy on 11/11/2021.  However he did not receive cycle 6-day 15 due to worsening neuropathy    Interval history-patient states that he still has some tingling numbness in his hands and feet but is overall improving.  He has not picked up Lyrica yet.  He hopes secondary get better with time.  Reports ongoing fatigue.  Denies other complaints at this time  ECOG PS- 1 Pain scale- 0   Review of systems- Review of Systems  Constitutional:  Positive for malaise/fatigue. Negative for chills, fever and weight loss.  HENT:  Negative for congestion, ear discharge and nosebleeds.   Eyes:  Negative for blurred vision.  Respiratory:  Negative for cough, hemoptysis, sputum production, shortness of breath and wheezing.   Cardiovascular:  Negative for chest pain, palpitations, orthopnea and claudication.  Gastrointestinal:  Negative for abdominal pain, blood in stool, constipation, diarrhea, heartburn, melena, nausea and vomiting.  Genitourinary:  Negative for dysuria, flank pain, frequency, hematuria and urgency.  Musculoskeletal:  Negative for back pain, joint pain and myalgias.  Skin:  Negative for rash.  Neurological:  Positive for sensory change (Peripheral neuropathy). Negative for dizziness, tingling, focal weakness, seizures, weakness and headaches.  Endo/Heme/Allergies:  Does not bruise/bleed easily.  Psychiatric/Behavioral:  Negative for depression and suicidal ideas. The patient does not have insomnia.      Allergies  Allergen Reactions   Ultram [Tramadol Hcl] Other (See Comments)    vertigo and passed out     Past Medical History:  Diagnosis Date  Anemia    Colon polyps    Depression    Diabetes mellitus without complication (HCC)    GERD (gastroesophageal  reflux disease)    Hemorrhoids    Hodgkin's lymphoma (HCC)    Hyperlipidemia    Hypertension    Pneumonia    Testicular hypofunction    Tinnitus      Past Surgical History:  Procedure Laterality Date   COLONOSCOPY WITH PROPOFOL N/A 04/07/2015   Procedure: COLONOSCOPY WITH PROPOFOL;  Surgeon: Hulen Luster, MD;  Location: [facility] ENDOSCOPY;  Service: Gastroenterology;  Laterality: N/A;   COLONOSCOPY WITH PROPOFOL N/A 09/01/2020   Procedure: COLONOSCOPY WITH PROPOFOL;  Surgeon: Toledo, Benay Pike, MD;  Location: [facility] ENDOSCOPY;  Service: Gastroenterology;  Laterality: N/A;   EYE SURGERY     cataract   MASS BIOPSY Left 05/07/2021   Procedure: NECK MASS BIOPSY,;  Surgeon: Jules Husbands, MD;  Location: [facility] ORS;  Service: General;  Laterality: Left;  Provider requesting 1.5 hours / 90 minutes for procedure.   PORTACATH PLACEMENT Right 05/21/2021   Procedure: INSERTION PORT-A-CATH;  Surgeon: Jules Husbands, MD;  Location: [facility] ORS;  Service: General;  Laterality: Right;    Social History   Socioeconomic History   Marital status: Married    Spouse name: [name]   Number of children: Not on file   Years of education: Not on file   Highest education level: Not on file  Occupational History   Not on file  Tobacco Use   Smoking status: Never   Smokeless tobacco: Never  Vaping Use   Vaping Use: Never used  Substance and Sexual Activity   Alcohol use: Never   Drug use: Never   Sexual activity: Yes  Other Topics Concern   Not on file  Social History Narrative   Not on file   Social Determinants of Health   Financial Resource Strain: Not on file  Food Insecurity: Not on file  Transportation Needs: Not on file  Physical Activity: Not on file  Stress: Not on file  Social Connections: Not on file  Intimate Partner Violence: Not on file    Family History  Problem Relation Age of Onset   Arthritis Mother    Stroke Paternal Aunt      Current Outpatient Medications:    amLODipine  (NORVASC) 5 MG tablet, Take 7.5 mg by mouth daily., Disp: , Rfl:    buPROPion (WELLBUTRIN XL) 150 MG 24 hr tablet, Take 450 mg by mouth daily., Disp: , Rfl:    dexamethasone (DECADRON) 4 MG tablet, Take 2 tablets by mouth once a day for 3 days after chemo. Take with food., Disp: 30 tablet, Rfl: 1   glipiZIDE (GLUCOTROL XL) 5 MG 24 hr tablet, Take 10 mg by mouth 2 (two) times daily., Disp: , Rfl:    ibuprofen (ADVIL,MOTRIN) 200 MG tablet, Take 200-400 mg by mouth every 6 (six) hours as needed for moderate pain., Disp: , Rfl:    lidocaine-prilocaine (EMLA) cream, Apply to affected area once, Disp: 30 g, Rfl: 3   lisinopril (PRINIVIL,ZESTRIL) 10 MG tablet, Take 10 mg by mouth daily., Disp: , Rfl:    loratadine (CLARITIN) 10 MG tablet, Take 10 mg by mouth daily as needed for allergies., Disp: , Rfl:    LORazepam (ATIVAN) 0.5 MG tablet, Take 1 tablet (0.5 mg total) by mouth every 6 (six) hours as needed (Nausea or vomiting)., Disp: 30 tablet, Rfl: 0   metFORMIN (GLUCOPHAGE) 500 MG tablet, Take 1,000 mg by  mouth 2 (two) times daily., Disp: , Rfl:    Multiple Vitamin (MULTIVITAMIN PO), Take 1 Package by mouth daily., Disp: , Rfl:    prochlorperazine (COMPAZINE) 10 MG tablet, Take 10 mg by mouth every 6 (six) hours as needed., Disp: , Rfl:    simvastatin (ZOCOR) 20 MG tablet, Take 20 mg by mouth every evening., Disp: , Rfl:    Testosterone 1.62 % GEL, Apply 2 Pump topically in the morning. Applied to shoulders/upper arms, Disp: , Rfl:    Triamcinolone Acetonide (NASACORT AQ NA), Place 2 sprays into both nostrils daily as needed (allergies)., Disp: , Rfl:    Homeopathic Products (LEG CRAMPS) TABS, Take by mouth as needed. Pt taking for leg cramps. (Patient not taking: Reported on 11/11/2021), Disp: , Rfl:    insulin glargine (LANTUS) 100 UNIT/ML injection, Inject 15 Units into the skin daily. At evening (Patient not taking: Reported on 12/08/2021), Disp: , Rfl:    LEVEMIR 100 UNIT/ML injection, Inject 15 Units  into the skin every evening. (Patient not taking: Reported on 12/08/2021), Disp: , Rfl:    ondansetron (ZOFRAN) 8 MG tablet, Take 1 tablet (8 mg total) by mouth 2 (two) times daily as needed. Start on the third day after chemotherapy. (Patient not taking: Reported on 11/11/2021), Disp: 30 tablet, Rfl: 1   oxyCODONE (OXY IR/ROXICODONE) 5 MG immediate release tablet, Take 1 tablet (5 mg total) by mouth every 4 (four) hours as needed for severe pain. (Patient not taking: Reported on 11/11/2021), Disp: 30 tablet, Rfl: 0   pantoprazole (PROTONIX) 20 MG tablet, Take 1 tablet by mouth once daily (Patient not taking: Reported on 11/11/2021), Disp: 30 tablet, Rfl: 0   pregabalin (LYRICA) 75 MG capsule, Take 1 capsule (75 mg total) by mouth 2 (two) times daily. (Patient not taking: Reported on 12/08/2021), Disp: 60 capsule, Rfl: 2   pregabalin (LYRICA) 75 MG capsule, Take 1 capsule (75 mg total) by mouth 2 (two) times daily. (Patient not taking: Reported on 12/08/2021), Disp: 60 capsule, Rfl: 2   sodium chloride (OCEAN) 0.65 % SOLN nasal spray, Place 1 spray into both nostrils as needed for congestion. (Patient not taking: Reported on 11/11/2021), Disp: , Rfl:    tetrahydrozoline-zinc (VISINE-AC) 0.05-0.25 % ophthalmic solution, Place 2 drops into both eyes 3 (three) times daily as needed (dry eyes). (Patient not taking: Reported on 11/11/2021), Disp: , Rfl:    trolamine salicylate (ASPERCREME) 10 % cream, Apply 1 application topically as needed for muscle pain. (Patient not taking: Reported on 11/11/2021), Disp: , Rfl:  No current facility-administered medications for this visit.  Facility-Administered Medications Ordered in Other Visits:    sodium chloride flush (NS) 0.9 % injection 10 mL, 10 mL, Intravenous, PRN, Sindy Guadeloupe, MD, 10 mL at 11/11/21 0835  Physical exam:  Vitals:   12/08/21 1333  BP: (!) 160/79  Pulse: 71  Resp: 18  Temp: (!) 96.9 F (36.1 C)  SpO2: 97%  Weight: 245 lb (111.1 kg)   Physical  Exam Constitutional:      General: He is not in acute distress. Cardiovascular:     Rate and Rhythm: Normal rate and regular rhythm.     Heart sounds: Normal heart sounds.  Pulmonary:     Effort: Pulmonary effort is normal.     Breath sounds: Normal breath sounds.  Abdominal:     General: Bowel sounds are normal.     Palpations: Abdomen is soft.  Skin:    General: Skin is warm and  dry.  Neurological:     Mental Status: He is alert and oriented to person, place, and time.        Latest Ref Rng & Units 11/11/2021    8:35 AM  CMP  Glucose 70 - 99 mg/dL 168    BUN 6 - 20 mg/dL 18    Creatinine 0.61 - 1.24 mg/dL 0.93    Sodium 135 - 145 mmol/L 136    Potassium 3.5 - 5.1 mmol/L 3.9    Chloride 98 - 111 mmol/L 103    CO2 22 - 32 mmol/L 25    Calcium 8.9 - 10.3 mg/dL 9.0    Total Protein 6.5 - 8.1 g/dL 6.9    Total Bilirubin 0.3 - 1.2 mg/dL 0.3    Alkaline Phos 38 - 126 U/L 75    AST 15 - 41 U/L 19    ALT 0 - 44 U/L 28        Latest Ref Rng & Units 11/11/2021    8:35 AM  CBC  WBC 4.0 - 10.5 K/uL 9.7    Hemoglobin 13.0 - 17.0 g/dL 11.4    Hematocrit 39.0 - 52.0 % 34.0    Platelets 150 - 400 K/uL 294      No images are attached to the encounter.  NM PET Image Restag (PS) Skull Base To Thigh  Result Date: 12/05/2021 CLINICAL DATA:  Subsequent treatment strategy for lymphoma. EXAM: NUCLEAR MEDICINE PET SKULL BASE TO THIGH TECHNIQUE: 13.48 mCi F-18 FDG was injected intravenously. Full-ring PET imaging was performed from the skull base to thigh after the radiotracer. CT data was obtained and used for attenuation correction and anatomic localization. Fasting blood glucose: 91 mg/dl COMPARISON:  PET-CT 07/28/2021 FINDINGS: Mediastinal blood pool activity: SUV max 1.57 Liver activity: SUV max 2.42 NECK: No hypermetabolic lymph nodes in the neck. Incidental CT findings: none CHEST: No hypermetabolic mediastinal or hilar nodes. No suspicious pulmonary nodules on the CT scan. Incidental CT  findings: Lad coronary artery calcifications. ABDOMEN/PELVIS: No abnormal hypermetabolic activity within the liver, pancreas, adrenal glands, or spleen. No hypermetabolic lymph nodes in the abdomen or pelvis. The spleen is normal in size without increased uptake above background liver activity. No focal tracer avid splenic lesions identified. Persistent mildly enlarged jejunal mesenteric lymph nodes are again noted without significant tracer uptake. Index lymph node measures 1.3 cm and has an SUV max of 1.25, image 162/2 (Deauville criteria 2). Incidental CT findings: Mild hepatic steatosis. Scattered non FDG avid low-density liver lesions are again noted compatible with liver cysts. SKELETON: No focal hypermetabolic activity to suggest skeletal metastasis. Incidental CT findings: none IMPRESSION: 1. Imaging findings compatible with complete metabolic response to therapy. No residual tracer avid adenopathy or soft tissue mass identified. Borderline enlarged jejunal mesenteric lymph nodes are again noted without significant FDG uptake. 2. No new sites of disease identified. Electronically Signed   By: Kerby Moors M.D.   On: 12/05/2021 16:23     Assessment and plan- Patient is a 61 y.o. male with history of stage III classical Hodgkin's lymphoma s/p 6 cycles of BV AVD chemotherapy here to discuss PET CT scan results and further management  I have reviewed PET CT scan images independently and discussed findings with the patient which shows excellent response to treatment.  He has a complete metabolic response to therapy with no tracer avid AT noted in the lymph nodes or no soft tissue mass identified.  His jejunal mesenteric lymph nodes are borderline enlarged at  1.3 cm with no FDG uptake.  Currently he is in CR 1 and has completed his Hodgkin's treatment.  He can get his port taken out at this time.  Surveillance plan would include history and physical exams every 3 months for up to 2 years as well as CT scans  every 6 months up to 2 years.  Following that he does not require surveillance imaging but will continue to follow-up for routine physical exams.  Chemo-induced peripheral neuropathy:Seems to be improving and patient has not picked up his Lyrica yet.  Continue to monitor   Visit Diagnosis 1. History of removal of Port-a-Cath   2. Other classical Hodgkin lymphoma of lymph nodes of multiple regions [facility])      Dr. Randa Evens, MD, MPH [facility] at [facility] [phone] 12/11/2021 9:02 AM

## 2021-12-14 ENCOUNTER — Ambulatory Visit (INDEPENDENT_AMBULATORY_CARE_PROVIDER_SITE_OTHER): Payer: BC Managed Care – PPO | Admitting: Surgery

## 2021-12-14 ENCOUNTER — Encounter: Payer: Self-pay | Admitting: Surgery

## 2021-12-14 VITALS — BP 162/84 | HR 70 | Temp 98.4°F | Ht 72.0 in | Wt 247.0 lb

## 2021-12-14 DIAGNOSIS — Z452 Encounter for adjustment and management of vascular access device: Secondary | ICD-10-CM

## 2021-12-14 DIAGNOSIS — C8101 Nodular lymphocyte predominant Hodgkin lymphoma, lymph nodes of head, face, and neck: Secondary | ICD-10-CM

## 2021-12-14 NOTE — Patient Instructions (Addendum)
May shower Wednesday morning, do not scrub at the area.  May use ice pack as needed for comfort, may use Tylenol or Ibuprofen for pain control.   You have sutures under the skin. These will dissolve in a few months.  The glue will start to come off in 1-2 weeks.   Call us if you develop any increasing redness at the surgery site, fever over 101, drainage at the surgery site, or chills.

## 2021-12-14 NOTE — Progress Notes (Signed)
Outpatient Surgical Follow Up  12/14/2021  Erik Buchanan is an 61 y.o. male.   Chief Complaint  Patient presents with   Procedure    HPI: This very is 61 year old male with a lymphoma of the neck is status post port placement.  He completed therapy and is in remission.  He did have a PET/CT last week that I personally reviewed showing no evidence of recurrent disease.  He is ready to get his port removed  Past Medical History:  Diagnosis Date   Anemia    Colon polyps    Depression    Diabetes mellitus without complication (HCC)    GERD (gastroesophageal reflux disease)    Hemorrhoids    Hodgkin's lymphoma ([place])    Hyperlipidemia    Hypertension    Pneumonia    Testicular hypofunction    Tinnitus     Past Surgical History:  Procedure Laterality Date   COLONOSCOPY WITH PROPOFOL N/A 04/07/2015   Procedure: COLONOSCOPY WITH PROPOFOL;  Surgeon: Hulen Luster, MD;  Location: [facility] ENDOSCOPY;  Service: Gastroenterology;  Laterality: N/A;   COLONOSCOPY WITH PROPOFOL N/A 09/01/2020   Procedure: COLONOSCOPY WITH PROPOFOL;  Surgeon: Toledo, Benay Pike, MD;  Location: [facility] ENDOSCOPY;  Service: Gastroenterology;  Laterality: N/A;   EYE SURGERY     cataract   MASS BIOPSY Left 05/07/2021   Procedure: NECK MASS BIOPSY,;  Surgeon: Jules Husbands, MD;  Location: [facility] ORS;  Service: General;  Laterality: Left;  Provider requesting 1.5 hours / 90 minutes for procedure.   PORTACATH PLACEMENT Right 05/21/2021   Procedure: INSERTION PORT-A-CATH;  Surgeon: Jules Husbands, MD;  Location: [facility] ORS;  Service: General;  Laterality: Right;    Family History  Problem Relation Age of Onset   Arthritis Mother    Stroke Paternal Aunt     Social History:  reports that he has never smoked. He has never used smokeless tobacco. He reports that he does not drink alcohol and does not use drugs.  Allergies:  Allergies  Allergen Reactions   Ultram [Tramadol Hcl] Other (See Comments)    vertigo and passed  out    Medications reviewed.    ROS Full ROS performed and is otherwise negative other than what is stated in HPI   BP (!) 162/84   Pulse 70   Temp 98.4 F (36.9 C)   Ht 6' (1.829 m)   Wt 247 lb (112 kg)   SpO2 97%   BMI 33.50 kg/m   Physical Exam Vitals and nursing note reviewed. Exam conducted with a chaperone present.  Constitutional:      General: He is not in acute distress.    Appearance: Normal appearance. He is normal weight.  Pulmonary:     Effort: Pulmonary effort is normal. No respiratory distress.     Breath sounds: No stridor.     Comments: Right chest wall w port in place , no infection Abdominal:     General: Abdomen is flat. There is no distension.     Palpations: Abdomen is soft. There is no mass.     Tenderness: There is no abdominal tenderness. There is no guarding or rebound.  Musculoskeletal:        General: No swelling or tenderness. Normal range of motion.     Cervical back: Normal range of motion and neck supple. No rigidity or tenderness.  Skin:    General: Skin is warm and dry.     Capillary Refill: Capillary refill takes less than  2 seconds.  Neurological:     General: No focal deficit present.     Mental Status: He is alert and oriented to person, place, and time.  Psychiatric:        Mood and Affect: Mood normal.        Behavior: Behavior normal.        Thought Content: Thought content normal.        Judgment: Judgment normal.     Assessment/Plan: Lymphoma of the neck in remission.  He wishes to have his port removed.  Procedure discussed with patient detail.  Risk benefits and possible implications.  He understands and wishes to proceed. Greater than 50% of the 20 minutes  visit was spent in counseling/coordination of care   Procedure Note Removal port  Anesthesia: lidocaine 1% w epi  EBL : minimal  Complications: none  After informed consent was obtained the patient was placed in the supine position and he was prepped and  draped in the usual sterile fashion.  Lidocaine 1% with epinephrine was infiltrated in the subcutaneous tissue using a 15 blade knife incision was created.  I was able to dissect the cuff from the port.  We cut the Prolene sutures and freed up the device from adjacent structures.  We asked the patient to do the Valsalva maneuver and remove the port.  Pressure was placed.  The wound was closed in a 2 layer fashion with interrupted 3-0 Vicryl's and 4-0 Monocryl for the skin.  Dermabond was applied.  No complications   Caroleen Hamman, MD [place] Surgeon

## 2022-02-01 ENCOUNTER — Other Ambulatory Visit: Payer: Self-pay

## 2022-02-02 ENCOUNTER — Other Ambulatory Visit: Payer: Self-pay

## 2022-02-24 ENCOUNTER — Encounter: Payer: Self-pay | Admitting: Oncology

## 2022-03-22 ENCOUNTER — Inpatient Hospital Stay (HOSPITAL_BASED_OUTPATIENT_CLINIC_OR_DEPARTMENT_OTHER): Payer: BC Managed Care – PPO | Admitting: Nurse Practitioner

## 2022-03-22 ENCOUNTER — Inpatient Hospital Stay: Payer: BC Managed Care – PPO | Attending: Nurse Practitioner

## 2022-03-22 VITALS — BP 158/79 | HR 70 | Temp 97.6°F | Wt 253.2 lb

## 2022-03-22 DIAGNOSIS — I10 Essential (primary) hypertension: Secondary | ICD-10-CM | POA: Insufficient documentation

## 2022-03-22 DIAGNOSIS — E119 Type 2 diabetes mellitus without complications: Secondary | ICD-10-CM | POA: Insufficient documentation

## 2022-03-22 DIAGNOSIS — Z7984 Long term (current) use of oral hypoglycemic drugs: Secondary | ICD-10-CM | POA: Insufficient documentation

## 2022-03-22 DIAGNOSIS — Z9221 Personal history of antineoplastic chemotherapy: Secondary | ICD-10-CM | POA: Diagnosis not present

## 2022-03-22 DIAGNOSIS — K219 Gastro-esophageal reflux disease without esophagitis: Secondary | ICD-10-CM | POA: Insufficient documentation

## 2022-03-22 DIAGNOSIS — C8198 Hodgkin lymphoma, unspecified, lymph nodes of multiple sites: Secondary | ICD-10-CM | POA: Diagnosis not present

## 2022-03-22 DIAGNOSIS — F32A Depression, unspecified: Secondary | ICD-10-CM | POA: Insufficient documentation

## 2022-03-22 DIAGNOSIS — Z794 Long term (current) use of insulin: Secondary | ICD-10-CM | POA: Diagnosis not present

## 2022-03-22 DIAGNOSIS — Z79899 Other long term (current) drug therapy: Secondary | ICD-10-CM | POA: Insufficient documentation

## 2022-03-22 DIAGNOSIS — E785 Hyperlipidemia, unspecified: Secondary | ICD-10-CM | POA: Diagnosis not present

## 2022-03-22 DIAGNOSIS — C8178 Other classical Hodgkin lymphoma, lymph nodes of multiple sites: Secondary | ICD-10-CM

## 2022-03-22 DIAGNOSIS — Z7952 Long term (current) use of systemic steroids: Secondary | ICD-10-CM | POA: Insufficient documentation

## 2022-03-22 LAB — LACTATE DEHYDROGENASE: LDH: 141 U/L (ref 98–192)

## 2022-03-22 NOTE — Progress Notes (Signed)
Hematology/Oncology Consult Note [facility]  [phone] [phone]  Patient Care Team: [place] Pink, MD as PCP - General (Family Medicine) Sindy Guadeloupe, MD as Consulting Physician (Oncology)   Name of the patient: Erik Buchanan  [id number]  08-15-1960   Date of visit: 03/22/22  Diagnosis- stage III classical Hodgkin's lymphoma  Chief complaint/ Reason for visit-discuss PET CT scan results and further management  Heme/Onc history:  patient presented as a 61 year old male with a past medical history significant for hypertension and diabetes among other medical problems.  He was noted to have left-sided neck swelling which has been ongoing for the last 4 months but he feels that the swelling has slowly increased in size.  He was seen by Dr. Kary Kos and underwent CT chest and CT soft tissue neck after initial ultrasound of the neck.  CT showed bulky supraclavicular adenopathy with the largest node measuring 4.1 cm CT chest with contrast also showed similar findings and no evidence of right supraclavicular axillary mediastinal or hilar adenopathy.  Lungs were clear.  Multiple hypodense lesions in the spleen which nearly replaces the splenic parenchyma.  Spleen however does not appear enlarged.    PET CT scan showed FDG avid adenopathy within the lower left neck to left supraclavicular region as well as small bowel mesentery peritoneum and porta hepatic lesions.  Multiple FDG avid splenic lesions.  Mild diffuse increased uptake in the bone marrow nonspecific.  No focal areas of increased uptake to suggest bone metastases.   Excisional supraclavicular lymph node biopsy showed classical Hodgkin's lymphoma. The H&E stained sections reveal lymph node effaced by a somewhat nodular proliferation of large, abnormal lymphoid cells, including "mummified" cells, "lacunar" cells, and occasional binucleated cells with classic Reed-Sternberg morphology, in a  background of predominantly small lymphocytes.  EBV negative   Stage III classical Hodgkin's lymphoma IPI score 2.5-year freedom from progression of 80% and 91% overall survival.  Patient completed 6 cycles of BV AVD chemotherapy on 11/11/2021.  However he did not receive cycle 6-day 15 due to worsening neuropathy    Interval history- Erik Buchanan is a 61 y.o. male who returns to clinic for follow up for hodgkin's lymphoma. Neuropathic symptoms continue to improve. He did not take lyrica. Denies new lumps or bumps. Is walking daily. Has ongoing fatigue.   ECOG PS- 1 Pain scale- 0   Review of systems- Review of Systems  Constitutional:  Positive for malaise/fatigue. Negative for chills, fever and weight loss.  HENT:  Negative for congestion, ear discharge and nosebleeds.   Eyes:  Negative for blurred vision.  Respiratory:  Negative for cough, hemoptysis, sputum production, shortness of breath and wheezing.   Cardiovascular:  Negative for chest pain, palpitations, orthopnea and claudication.  Gastrointestinal:  Negative for abdominal pain, blood in stool, constipation, diarrhea, heartburn, melena, nausea and vomiting.  Genitourinary:  Negative for dysuria, flank pain, frequency, hematuria and urgency.  Musculoskeletal:  Negative for back pain, joint pain and myalgias.  Skin:  Negative for rash.  Neurological:  Positive for sensory change (Peripheral neuropathy). Negative for dizziness, tingling, focal weakness, seizures, weakness and headaches.  Endo/Heme/Allergies:  Does not bruise/bleed easily.  Psychiatric/Behavioral:  Negative for depression and suicidal ideas. The patient does not have insomnia.      Allergies  Allergen Reactions   Ultram [Tramadol Hcl] Other (See Comments)    vertigo and passed out    Past Medical History:  Diagnosis Date   Anemia    Colon  polyps    Depression    Diabetes mellitus without complication (HCC)    GERD (gastroesophageal reflux disease)     Hemorrhoids    Hodgkin's lymphoma ([place])    Hyperlipidemia    Hypertension    Pneumonia    Testicular hypofunction    Tinnitus     Past Surgical History:  Procedure Laterality Date   COLONOSCOPY WITH PROPOFOL N/A 04/07/2015   Procedure: COLONOSCOPY WITH PROPOFOL;  Surgeon: Hulen Luster, MD;  Location: [facility] ENDOSCOPY;  Service: Gastroenterology;  Laterality: N/A;   COLONOSCOPY WITH PROPOFOL N/A 09/01/2020   Procedure: COLONOSCOPY WITH PROPOFOL;  Surgeon: Toledo, Benay Pike, MD;  Location: [facility] ENDOSCOPY;  Service: Gastroenterology;  Laterality: N/A;   EYE SURGERY     cataract   MASS BIOPSY Left 05/07/2021   Procedure: NECK MASS BIOPSY,;  Surgeon: Jules Husbands, MD;  Location: [facility] ORS;  Service: General;  Laterality: Left;  Provider requesting 1.5 hours / 90 minutes for procedure.   PORTACATH PLACEMENT Right 05/21/2021   Procedure: INSERTION PORT-A-CATH;  Surgeon: Jules Husbands, MD;  Location: [facility] ORS;  Service: General;  Laterality: Right;    Social History   Socioeconomic History   Marital status: Married    Spouse name: Erik Buchanan   Number of children: Not on file   Years of education: Not on file   Highest education level: Not on file  Occupational History   Not on file  Tobacco Use   Smoking status: Never   Smokeless tobacco: Never  Vaping Use   Vaping Use: Never used  Substance and Sexual Activity   Alcohol use: Never   Drug use: Never   Sexual activity: Yes  Other Topics Concern   Not on file  Social History Narrative   Not on file   Social Determinants of Health   Financial Resource Strain: Not on file  Food Insecurity: Not on file  Transportation Needs: Not on file  Physical Activity: Not on file  Stress: Not on file  Social Connections: Not on file  Intimate Partner Violence: Not on file    Family History  Problem Relation Age of Onset   Arthritis Mother    Stroke Paternal Aunt      Current Outpatient Medications:    amLODipine (NORVASC) 5 MG  tablet, Take 7.5 mg by mouth daily., Disp: , Rfl:    buPROPion (WELLBUTRIN XL) 150 MG 24 hr tablet, Take 450 mg by mouth daily., Disp: , Rfl:    glipiZIDE (GLUCOTROL XL) 5 MG 24 hr tablet, Take 10 mg by mouth 2 (two) times daily., Disp: , Rfl:    lisinopril (PRINIVIL,ZESTRIL) 10 MG tablet, Take 10 mg by mouth daily., Disp: , Rfl:    metFORMIN (GLUCOPHAGE) 500 MG tablet, Take 1,000 mg by mouth 2 (two) times daily., Disp: , Rfl:    simvastatin (ZOCOR) 20 MG tablet, Take 20 mg by mouth every evening., Disp: , Rfl:    dexamethasone (DECADRON) 4 MG tablet, Take 2 tablets by mouth once a day for 3 days after chemo. Take with food., Disp: 30 tablet, Rfl: 1   Homeopathic Products (LEG CRAMPS) TABS, Take by mouth as needed. Pt taking for leg cramps. (Patient not taking: Reported on 11/11/2021), Disp: , Rfl:    ibuprofen (ADVIL,MOTRIN) 200 MG tablet, Take 200-400 mg by mouth every 6 (six) hours as needed for moderate pain., Disp: , Rfl:    insulin glargine (LANTUS) 100 UNIT/ML injection, Inject 15 Units into the skin daily. At evening (  Patient not taking: Reported on 03/22/2022), Disp: , Rfl:    LEVEMIR 100 UNIT/ML injection, Inject 15 Units into the skin every evening. (Patient not taking: Reported on 12/08/2021), Disp: , Rfl:    lidocaine-prilocaine (EMLA) cream, Apply to affected area once (Patient not taking: Reported on 03/22/2022), Disp: 30 g, Rfl: 3   loratadine (CLARITIN) 10 MG tablet, Take 10 mg by mouth daily as needed for allergies., Disp: , Rfl:    LORazepam (ATIVAN) 0.5 MG tablet, Take 1 tablet (0.5 mg total) by mouth every 6 (six) hours as needed (Nausea or vomiting)., Disp: 30 tablet, Rfl: 0   Multiple Vitamin (MULTIVITAMIN PO), Take 1 Package by mouth daily., Disp: , Rfl:    ondansetron (ZOFRAN) 8 MG tablet, Take 1 tablet (8 mg total) by mouth 2 (two) times daily as needed. Start on the third day after chemotherapy. (Patient not taking: Reported on 11/11/2021), Disp: 30 tablet, Rfl: 1   oxyCODONE (OXY  IR/ROXICODONE) 5 MG immediate release tablet, Take 1 tablet (5 mg total) by mouth every 4 (four) hours as needed for severe pain. (Patient not taking: Reported on 11/11/2021), Disp: 30 tablet, Rfl: 0   pantoprazole (PROTONIX) 20 MG tablet, Take 1 tablet by mouth once daily (Patient not taking: Reported on 11/11/2021), Disp: 30 tablet, Rfl: 0   pregabalin (LYRICA) 75 MG capsule, Take 1 capsule (75 mg total) by mouth 2 (two) times daily. (Patient not taking: Reported on 12/08/2021), Disp: 60 capsule, Rfl: 2   pregabalin (LYRICA) 75 MG capsule, Take 1 capsule (75 mg total) by mouth 2 (two) times daily. (Patient not taking: Reported on 12/08/2021), Disp: 60 capsule, Rfl: 2   prochlorperazine (COMPAZINE) 10 MG tablet, Take 10 mg by mouth every 6 (six) hours as needed., Disp: , Rfl:    sodium chloride (OCEAN) 0.65 % SOLN nasal spray, Place 1 spray into both nostrils as needed for congestion. (Patient not taking: Reported on 11/11/2021), Disp: , Rfl:    tadalafil (CIALIS) 20 MG tablet, Take 20 mg by mouth daily as needed., Disp: , Rfl:    Testosterone 1.62 % GEL, Apply 2 Pump topically in the morning. Applied to shoulders/upper arms, Disp: , Rfl:    tetrahydrozoline-zinc (VISINE-AC) 0.05-0.25 % ophthalmic solution, Place 2 drops into both eyes 3 (three) times daily as needed (dry eyes). (Patient not taking: Reported on 11/11/2021), Disp: , Rfl:    Triamcinolone Acetonide (NASACORT AQ NA), Place 2 sprays into both nostrils daily as needed (allergies)., Disp: , Rfl:    trolamine salicylate (ASPERCREME) 10 % cream, Apply 1 application topically as needed for muscle pain. (Patient not taking: Reported on 11/11/2021), Disp: , Rfl:  No current facility-administered medications for this visit.  Facility-Administered Medications Ordered in Other Visits:    sodium chloride flush (NS) 0.9 % injection 10 mL, 10 mL, Intravenous, PRN, Sindy Guadeloupe, MD, 10 mL at 11/11/21 0835  Physical exam:  Vitals:   03/22/22 1451  BP: (!)  158/79  Pulse: 70  Temp: 97.6 F (36.4 C)  TempSrc: Tympanic  SpO2: 95%  Weight: 253 lb 3.2 oz (114.9 kg)   Physical Exam Constitutional:      General: He is not in acute distress. Cardiovascular:     Rate and Rhythm: Normal rate and regular rhythm.     Heart sounds: Normal heart sounds.  Pulmonary:     Effort: Pulmonary effort is normal.     Breath sounds: Normal breath sounds.  Abdominal:     General: Bowel sounds  are normal.     Palpations: Abdomen is soft.  Skin:    General: Skin is warm and dry.  Neurological:     Mental Status: He is alert and oriented to person, place, and time.       Latest Ref Rng & Units 11/11/2021    8:35 AM  CBC  WBC 4.0 - 10.5 K/uL 9.7   Hemoglobin 13.0 - 17.0 g/dL 11.4   Hematocrit 39.0 - 52.0 % 34.0   Platelets 150 - 400 K/uL 294      No results found.   Assessment and Plan- Patient is a 61 y.o. male with history of stage III classical Hodgkin's lymphoma s/p 6 cycles of BV AVD chemotherapy with complete response on May 2023 PET scan who returns to clinic for continued surveillance.   He remains clinically asymptomatic. Reviewed surveillance guidelines including history and physical exam every 3 months for up to 2 years then every 6 months to a year as well as CT scans every 6 months for up to 2 years.   Chemo-induced peripheral neuropathy: gradually improving. Declined lyrica. Monitor  Cecal Polyp- seen on colonoscopy on 09/01/20. Resection was recommended but patient declined appointment. Encouraged patient to reach out to GI for follow up.   Anemia- previously iron studies were normal. Suspect anemia of chronic disease. Encouraged compliance with diabetic diet. Sugars improved previously.   Disposition 2.5 months- CT C/A/P  3 mo- lab (cbc, cmp, ldh, ferritin, b12, iron studies), Dr. Janese Banks- la   Visit Diagnosis 1. Other classical Hodgkin lymphoma of lymph nodes of multiple regions ([place])    Beckey Rutter, [place], AGNP-C [place] at  [facility] [phone] (clinic) 03/22/2022

## 2022-03-22 NOTE — Progress Notes (Signed)
Survivorship Care Plan visit completed.  Treatment summary reviewed and given to patient.  ASCO answers booklet reviewed and given to patient.  CARE program and Cancer Transitions discussed with patient along with other resources cancer center offers to patients and caregivers.  Patient verbalized understanding.    

## 2022-03-22 NOTE — Progress Notes (Signed)
Pt returns for 3 mo follow-up. Reports that neuropathy has improved. He was prescribed medication for neuropathy, but has decided not to take it.

## 2022-05-10 ENCOUNTER — Encounter (INDEPENDENT_AMBULATORY_CARE_PROVIDER_SITE_OTHER): Payer: Self-pay

## 2022-06-11 ENCOUNTER — Ambulatory Visit
Admission: RE | Admit: 2022-06-11 | Discharge: 2022-06-11 | Disposition: A | Discharge status: Home / Self Care | Payer: BC Managed Care – PPO | Source: Ambulatory Visit | Attending: Nurse Practitioner | Admitting: Nurse Practitioner

## 2022-06-11 DIAGNOSIS — C8178 Other classical Hodgkin lymphoma, lymph nodes of multiple sites: Secondary | ICD-10-CM | POA: Diagnosis present

## 2022-06-11 LAB — POCT I-STAT CREATININE: Creatinine, Ser: 0.9 mg/dL (ref 0.61–1.24)

## 2022-06-11 MED ORDER — IOHEXOL 350 MG/ML SOLN
100.0000 mL | Freq: Once | INTRAVENOUS | Status: AC | PRN
  Administered 2022-06-11: 100 mL via INTRAVENOUS

## 2022-06-21 ENCOUNTER — Encounter: Payer: Self-pay | Admitting: Oncology

## 2022-06-21 ENCOUNTER — Inpatient Hospital Stay (HOSPITAL_BASED_OUTPATIENT_CLINIC_OR_DEPARTMENT_OTHER): Payer: BC Managed Care – PPO | Admitting: Oncology

## 2022-06-21 ENCOUNTER — Inpatient Hospital Stay: Payer: BC Managed Care – PPO | Attending: Nurse Practitioner

## 2022-06-21 VITALS — BP 155/82 | HR 70 | Temp 97.6°F | Resp 16 | Wt 259.0 lb

## 2022-06-21 DIAGNOSIS — C8198 Hodgkin lymphoma, unspecified, lymph nodes of multiple sites: Secondary | ICD-10-CM | POA: Insufficient documentation

## 2022-06-21 DIAGNOSIS — Z79899 Other long term (current) drug therapy: Secondary | ICD-10-CM | POA: Insufficient documentation

## 2022-06-21 DIAGNOSIS — Z8571 Personal history of Hodgkin lymphoma: Secondary | ICD-10-CM | POA: Diagnosis not present

## 2022-06-21 DIAGNOSIS — R12 Heartburn: Secondary | ICD-10-CM | POA: Diagnosis not present

## 2022-06-21 DIAGNOSIS — C8178 Other classical Hodgkin lymphoma, lymph nodes of multiple sites: Secondary | ICD-10-CM

## 2022-06-21 DIAGNOSIS — E119 Type 2 diabetes mellitus without complications: Secondary | ICD-10-CM | POA: Diagnosis not present

## 2022-06-21 DIAGNOSIS — Z08 Encounter for follow-up examination after completed treatment for malignant neoplasm: Secondary | ICD-10-CM | POA: Diagnosis not present

## 2022-06-21 DIAGNOSIS — I10 Essential (primary) hypertension: Secondary | ICD-10-CM | POA: Diagnosis not present

## 2022-06-21 LAB — CBC WITH DIFFERENTIAL/PLATELET
Abs Immature Granulocytes: 0.03 10*3/uL (ref 0.00–0.07)
Basophils Absolute: 0 10*3/uL (ref 0.0–0.1)
Basophils Relative: 1 %
Eosinophils Absolute: 0.1 10*3/uL (ref 0.0–0.5)
Eosinophils Relative: 2 %
HCT: 36.3 % — ABNORMAL LOW (ref 39.0–52.0)
Hemoglobin: 12.5 g/dL — ABNORMAL LOW (ref 13.0–17.0)
Immature Granulocytes: 1 %
Lymphocytes Relative: 29 %
Lymphs Abs: 1.8 10*3/uL (ref 0.7–4.0)
MCH: 28.7 pg (ref 26.0–34.0)
MCHC: 34.4 g/dL (ref 30.0–36.0)
MCV: 83.3 fL (ref 80.0–100.0)
Monocytes Absolute: 0.4 10*3/uL (ref 0.1–1.0)
Monocytes Relative: 7 %
Neutro Abs: 3.8 10*3/uL (ref 1.7–7.7)
Neutrophils Relative %: 60 %
Platelets: 229 10*3/uL (ref 150–400)
RBC: 4.36 MIL/uL (ref 4.22–5.81)
RDW: 12.6 % (ref 11.5–15.5)
WBC: 6.1 10*3/uL (ref 4.0–10.5)
nRBC: 0 % (ref 0.0–0.2)

## 2022-06-21 LAB — COMPREHENSIVE METABOLIC PANEL
ALT: 32 U/L (ref 0–44)
AST: 23 U/L (ref 15–41)
Albumin: 4 g/dL (ref 3.5–5.0)
Alkaline Phosphatase: 50 U/L (ref 38–126)
Anion gap: 10 (ref 5–15)
BUN: 17 mg/dL (ref 6–20)
CO2: 24 mmol/L (ref 22–32)
Calcium: 8.6 mg/dL — ABNORMAL LOW (ref 8.9–10.3)
Chloride: 103 mmol/L (ref 98–111)
Creatinine, Ser: 1.03 mg/dL (ref 0.61–1.24)
GFR, Estimated: >60 mL/min (ref >60)
Glucose, Bld: 154 mg/dL — ABNORMAL HIGH (ref 70–99)
Potassium: 3.9 mmol/L (ref 3.5–5.1)
Sodium: 137 mmol/L (ref 135–145)
Total Bilirubin: 0.3 mg/dL (ref 0.3–1.2)
Total Protein: 6.6 g/dL (ref 6.5–8.1)

## 2022-06-21 LAB — IRON AND TIBC
Iron: 57 ug/dL (ref 45–182)
Saturation Ratios: 14 % — ABNORMAL LOW (ref 17.9–39.5)
TIBC: 423 ug/dL (ref 250–450)
UIBC: 366 ug/dL

## 2022-06-21 LAB — LACTATE DEHYDROGENASE: LDH: 126 U/L (ref 98–192)

## 2022-06-21 LAB — FERRITIN: Ferritin: 13 ng/mL — ABNORMAL LOW (ref 24–336)

## 2022-06-21 LAB — VITAMIN B12: Vitamin B-12: 419 pg/mL (ref 180–914)

## 2022-06-21 MED ORDER — PANTOPRAZOLE SODIUM 20 MG PO TBEC: 20.0000 mg | DELAYED_RELEASE_TABLET | Freq: Every day | ORAL | 1 refills | Status: AC

## 2022-06-21 NOTE — Progress Notes (Signed)
Hematology/Oncology Consult note [facility]  [phone] [phone]  Patient Care Team: [place] Pink, MD as PCP - General (Family Medicine) Sindy Guadeloupe, MD as Consulting Physician (Oncology)   Name of the patient: Erik Buchanan  [id number]  Aug 15, 1960   Date of visit: 06/21/22  Diagnosis- stage III classical Hodgkin's lymphoma   Chief complaint/ Reason for visit-routine follow-up of Hodgkin's lymphoma  Heme/Onc history: patient is a 61 year old male with a past medical history significant for hypertension and diabetes among other medical problems.  He was noted to have left-sided neck swelling which has been ongoing for the last 4 months but he feels that the swelling has slowly increased in size.  He was seen by Dr. Kary Kos and underwent CT chest and CT soft tissue neck after initial ultrasound of the neck.  CT showed bulky supraclavicular adenopathy with the largest node measuring 4.1 cm CT chest with contrast also showed similar findings and no evidence of right supraclavicular axillary mediastinal or hilar adenopathy.  Lungs were clear.  Multiple hypodense lesions in the spleen which nearly replaces the splenic parenchyma.  Spleen however does not appear enlarged.    PET CT scan showed FDG avid adenopathy within the lower left neck to left supraclavicular region as well as small bowel mesentery peritoneum and porta hepatic lesions.  Multiple FDG avid splenic lesions.  Mild diffuse increased uptake in the bone marrow nonspecific.  No focal areas of increased uptake to suggest bone metastases.   Excisional supraclavicular lymph node biopsy showed classical Hodgkin's lymphoma. The H&E stained sections reveal lymph node effaced by a somewhat nodular proliferation of large, abnormal lymphoid cells, including "mummified" cells, "lacunar" cells, and occasional binucleated cells with classic Reed-Sternberg morphology, in a background of  predominantly small lymphocytes.  EBV negative   Stage III classical Hodgkin's lymphoma IPI score 2.5-year freedom from progression of 80% and 91% overall survival.   Patient completed 6 cycles of BV AVD chemotherapy on 11/11/2021.  However he did not receive cycle 6-day 15 due to worsening neuropathy    Interval history-neuropathy has significantly improved and his fingers.  He still has some mild neuropathy in his toes.  It does not affect his ADLs.  He will be undergoing colonoscopy this week for a polyp that was found and needs to be resected at [facility].  He reports ongoing heartburn symptoms which makes it difficult for him to sleep.  ECOG PS- 0 Pain scale- 0   Review of systems- Review of Systems  Constitutional:  Negative for chills, fever, malaise/fatigue and weight loss.  HENT:  Negative for congestion, ear discharge and nosebleeds.   Eyes:  Negative for blurred vision.  Respiratory:  Negative for cough, hemoptysis, sputum production, shortness of breath and wheezing.   Cardiovascular:  Negative for chest pain, palpitations, orthopnea and claudication.  Gastrointestinal:  Positive for heartburn. Negative for abdominal pain, blood in stool, constipation, diarrhea, melena, nausea and vomiting.  Genitourinary:  Negative for dysuria, flank pain, frequency, hematuria and urgency.  Musculoskeletal:  Negative for back pain, joint pain and myalgias.  Skin:  Negative for rash.  Neurological:  Negative for dizziness, tingling, focal weakness, seizures, weakness and headaches.  Endo/Heme/Allergies:  Does not bruise/bleed easily.  Psychiatric/Behavioral:  Negative for depression and suicidal ideas. The patient does not have insomnia.       Allergies  Allergen Reactions   Ultram [Tramadol Hcl] Other (See Comments)    vertigo and passed out  Past Medical History:  Diagnosis Date   Anemia    Colon polyps    Depression    Diabetes mellitus without complication (HCC)     GERD (gastroesophageal reflux disease)    Hemorrhoids    Hodgkin's lymphoma ([place])    Hyperlipidemia    Hypertension    Pneumonia    Testicular hypofunction    Tinnitus      Past Surgical History:  Procedure Laterality Date   COLONOSCOPY WITH PROPOFOL N/A 04/07/2015   Procedure: COLONOSCOPY WITH PROPOFOL;  Surgeon: Hulen Luster, MD;  Location: [facility] ENDOSCOPY;  Service: Gastroenterology;  Laterality: N/A;   COLONOSCOPY WITH PROPOFOL N/A 09/01/2020   Procedure: COLONOSCOPY WITH PROPOFOL;  Surgeon: Toledo, Benay Pike, MD;  Location: [facility] ENDOSCOPY;  Service: Gastroenterology;  Laterality: N/A;   EYE SURGERY     cataract   MASS BIOPSY Left 05/07/2021   Procedure: NECK MASS BIOPSY,;  Surgeon: Jules Husbands, MD;  Location: [facility] ORS;  Service: General;  Laterality: Left;  Provider requesting 1.5 hours / 90 minutes for procedure.   PORTACATH PLACEMENT Right 05/21/2021   Procedure: INSERTION PORT-A-CATH;  Surgeon: Jules Husbands, MD;  Location: [facility] ORS;  Service: General;  Laterality: Right;    Social History   Socioeconomic History   Marital status: Married    Spouse name: [name]   Number of children: Not on file   Years of education: Not on file   Highest education level: Not on file  Occupational History   Not on file  Tobacco Use   Smoking status: Never   Smokeless tobacco: Never  Vaping Use   Vaping Use: Never used  Substance and Sexual Activity   Alcohol use: Never   Drug use: Never   Sexual activity: Yes  Other Topics Concern   Not on file  Social History Narrative   Not on file   Social Determinants of Health   Financial Resource Strain: Not on file  Food Insecurity: Not on file  Transportation Needs: Not on file  Physical Activity: Not on file  Stress: Not on file  Social Connections: Not on file  Intimate Partner Violence: Not on file    Family History  Problem Relation Age of Onset   Arthritis Mother    Stroke Paternal Aunt      Current Outpatient  Medications:    amLODipine (NORVASC) 5 MG tablet, Take 7.5 mg by mouth daily., Disp: , Rfl:    buPROPion (WELLBUTRIN XL) 150 MG 24 hr tablet, Take 450 mg by mouth daily., Disp: , Rfl:    glipiZIDE (GLUCOTROL) 10 MG tablet, Take 10 mg by mouth at bedtime., Disp: , Rfl:    Homeopathic Products (LEG CRAMPS) TABS, Take by mouth as needed. Pt taking for leg cramps., Disp: , Rfl:    ibuprofen (ADVIL,MOTRIN) 200 MG tablet, Take 200-400 mg by mouth every 6 (six) hours as needed for moderate pain., Disp: , Rfl:    lisinopril (PRINIVIL,ZESTRIL) 10 MG tablet, Take 15 mg by mouth daily., Disp: , Rfl:    metFORMIN (GLUCOPHAGE) 500 MG tablet, Take 1,000 mg by mouth 2 (two) times daily., Disp: , Rfl:    pantoprazole (PROTONIX) 20 MG tablet, Take 1 tablet (20 mg total) by mouth daily., Disp: 30 tablet, Rfl: 1   simvastatin (ZOCOR) 20 MG tablet, Take 20 mg by mouth every evening., Disp: , Rfl:    sodium chloride (OCEAN) 0.65 % SOLN nasal spray, Place 1 spray into both nostrils as needed for congestion., Disp: ,  Rfl:    tadalafil (CIALIS) 20 MG tablet, Take 20 mg by mouth daily as needed., Disp: , Rfl:    Triamcinolone Acetonide (NASACORT AQ NA), Place 2 sprays into both nostrils daily as needed (allergies)., Disp: , Rfl:    trolamine salicylate (ASPERCREME) 10 % cream, Apply 1 application  topically as needed for muscle pain., Disp: , Rfl:    Multiple Vitamin (MULTIVITAMIN PO), Take 1 Package by mouth daily. (Patient not taking: Reported on 06/21/2022), Disp: , Rfl:    tetrahydrozoline-zinc (VISINE-AC) 0.05-0.25 % ophthalmic solution, Place 2 drops into both eyes 3 (three) times daily as needed (dry eyes). (Patient not taking: Reported on 06/21/2022), Disp: , Rfl:  No current facility-administered medications for this visit.  Facility-Administered Medications Ordered in Other Visits:    sodium chloride flush (NS) 0.9 % injection 10 mL, 10 mL, Intravenous, PRN, Sindy Guadeloupe, MD, 10 mL at 11/11/21 0835  Physical  exam:  Vitals:   06/21/22 1509  BP: (!) 155/82  Pulse: 70  Resp: 16  Temp: 97.6 F (36.4 C)  TempSrc: Tympanic  Weight: 259 lb (117.5 kg)   Physical Exam Constitutional:      General: He is not in acute distress. Cardiovascular:     Rate and Rhythm: Normal rate and regular rhythm.     Heart sounds: Normal heart sounds.  Pulmonary:     Effort: Pulmonary effort is normal.     Breath sounds: Normal breath sounds.  Abdominal:     General: Bowel sounds are normal.     Palpations: Abdomen is soft.  Skin:    General: Skin is warm and dry.  Neurological:     Mental Status: He is alert and oriented to person, place, and time.         Latest Ref Rng & Units 06/21/2022    2:33 PM  CMP  Glucose 70 - 99 mg/dL 154   BUN 6 - 20 mg/dL 17   Creatinine 0.61 - 1.24 mg/dL 1.03   Sodium 135 - 145 mmol/L 137   Potassium 3.5 - 5.1 mmol/L 3.9   Chloride 98 - 111 mmol/L 103   CO2 22 - 32 mmol/L 24   Calcium 8.9 - 10.3 mg/dL 8.6   Total Protein 6.5 - 8.1 g/dL 6.6   Total Bilirubin 0.3 - 1.2 mg/dL 0.3   Alkaline Phos 38 - 126 U/L 50   AST 15 - 41 U/L 23   ALT 0 - 44 U/L 32       Latest Ref Rng & Units 06/21/2022    2:33 PM  CBC  WBC 4.0 - 10.5 K/uL 6.1   Hemoglobin 13.0 - 17.0 g/dL 12.5   Hematocrit 39.0 - 52.0 % 36.3   Platelets 150 - 400 K/uL 229     No images are attached to the encounter.  CT CHEST ABDOMEN PELVIS W CONTRAST  Result Date: 06/12/2022 CLINICAL DATA:  Hodgkin's lymphoma, chemotherapy * Tracking Code: BO * EXAM: CT CHEST, ABDOMEN, AND PELVIS WITH CONTRAST TECHNIQUE: Multidetector CT imaging of the chest, abdomen and pelvis was performed following the standard protocol during bolus administration of intravenous contrast. RADIATION DOSE REDUCTION: This exam was performed according to the departmental dose-optimization program which includes automated exposure control, adjustment of the mA and/or kV according to patient size and/or use of iterative reconstruction  technique. CONTRAST:  138m OMNIPAQUE IOHEXOL 350 MG/ML SOLN additional oral enteric contrast COMPARISON:  PET-CT, 12/03/2021 FINDINGS: CT CHEST FINDINGS Cardiovascular: No significant vascular findings. Normal  heart size. Coronary artery calcifications. No pericardial effusion. Mediastinum/Nodes: No enlarged mediastinal, hilar, or axillary lymph nodes. Thyroid gland, trachea, and esophagus demonstrate no significant findings. Lungs/Pleura: Diffuse bilateral bronchial wall thickening. No pleural effusion or pneumothorax. Musculoskeletal: No chest wall abnormality. No acute osseous findings. CT ABDOMEN PELVIS FINDINGS Hepatobiliary: No solid liver abnormality is seen. Numerous low-attenuation lesions throughout the liver, largest of which are clearly benign simple cysts others too small to characterize although almost certainly additional subcentimeter cysts. No new lesions and no associated FDG avidity on prior PET-CT no gallstones, gallbladder wall thickening, or biliary dilatation. Pancreas: Unremarkable. No pancreatic ductal dilatation or surrounding inflammatory changes. Spleen: Normal in size without significant abnormality. Adrenals/Urinary Tract: Adrenal glands are unremarkable. Kidneys are normal, without renal calculi, solid lesion, or hydronephrosis. Bladder is unremarkable. Stomach/Bowel: Stomach is within normal limits. Appendix appears normal. No evidence of bowel wall thickening, distention, or inflammatory changes. Occasional sigmoid diverticula. Vascular/Lymphatic: No significant vascular findings are present. Unchanged, mildly enlarged small bowel mesenteric lymph nodes in the left hemiabdomen measuring up to 1.7 x 1.5 cm (series 2, image 72). Reproductive: Mild prostatomegaly. Other: No abdominal wall hernia or abnormality. No ascites. Musculoskeletal: No acute osseous findings. Unchanged wedge deformity of the L1 vertebral body (series 6, image 122). IMPRESSION: 1. Unchanged, mildly enlarged small  bowel mesenteric lymph nodes in the left hemiabdomen, not previously FDG avid. 2. No other evidence of lymphadenopathy in the chest, abdomen, or pelvis. 3. Normal size spleen. 4. Diffuse bilateral bronchial wall thickening, consistent with nonspecific infectious or inflammatory bronchitis. 5. Mild prostatomegaly. 6. Coronary artery disease. Electronically Signed   By: Delanna Ahmadi M.D.   On: 06/12/2022 22:42     Assessment and plan- Patient is a 60 y.o. male with history of stage III classical Hodgkin's lymphoma s/p 6 cycles of BV AVD chemotherapy.  He is currently in CR 1 and this is a routine follow-up visit  I have reviewed CT chest abdomen and pelvis images independently and discussed findings with the patient which does not show any new or progressive disease.  He has stable intra-abdominal adenopathy which was borderline and did not have any significant FDG uptake on his prior PET scan.  I will see him back in 3 months with labs and plan to get a repeat CT in 6 months.  Heartburn: He will discuss this further with PCP as well as GI.  For now I am giving him a prescription for pantoprazole 20 mg daily.   Visit Diagnosis 1. Encounter for follow-up surveillance of Hodgkin's disease   2. Heartburn      Dr. Randa Evens, MD, MPH [facility] [phone] 06/21/2022 4:28 PM

## 2022-07-19 ENCOUNTER — Other Ambulatory Visit: Payer: Self-pay | Admitting: Nurse Practitioner

## 2022-07-19 ENCOUNTER — Encounter: Payer: Self-pay | Admitting: Nurse Practitioner

## 2022-07-19 DIAGNOSIS — D509 Iron deficiency anemia, unspecified: Secondary | ICD-10-CM

## 2022-07-19 MED ORDER — VITRON-C 65-125 MG PO TABS: 1.0000 | ORAL_TABLET | Freq: Every day | ORAL | 2 refills | Status: AC

## 2022-07-19 NOTE — Progress Notes (Signed)
At last visit iron levels were found to be decreased. I spoke to patient by phone. He is symptomatic. Recommend trial of oral iron. The side effects of iron are discussed, primarily GI in type, such as cramping, constipation, black stools. Start with vitron-c. Prescription sent but reviewed that this is available over the counter as well. Will recheck levels at next visit.

## 2022-07-22 ENCOUNTER — Telehealth: Payer: Self-pay | Admitting: *Deleted

## 2022-07-22 NOTE — Telephone Encounter (Signed)
I called [facility] and was told that they do have the prescription, but it was put on hold because his insurance denied paying for it. I asked her the price of purchasing it out of pocket and she said 30 tabs are $6.86. I called Mrs Stiff back and let her know and she is going to go pay for out of pocket

## 2022-07-22 NOTE — Telephone Encounter (Signed)
Wife called reporting that an iron prescription was to have been sent for patient and has not been sent. Please sent prescription as discussed

## 2022-09-28 ENCOUNTER — Ambulatory Visit: Payer: BC Managed Care – PPO | Admitting: Oncology

## 2022-09-28 ENCOUNTER — Inpatient Hospital Stay: Payer: BC Managed Care – PPO | Attending: Nurse Practitioner

## 2022-09-28 ENCOUNTER — Other Ambulatory Visit: Payer: BC Managed Care – PPO

## 2022-09-28 ENCOUNTER — Inpatient Hospital Stay (HOSPITAL_BASED_OUTPATIENT_CLINIC_OR_DEPARTMENT_OTHER): Payer: BC Managed Care – PPO | Admitting: Oncology

## 2022-09-28 VITALS — BP 139/80 | HR 65 | Temp 98.7°F | Resp 18 | Ht 71.0 in | Wt 252.2 lb

## 2022-09-28 DIAGNOSIS — Z8571 Personal history of Hodgkin lymphoma: Secondary | ICD-10-CM

## 2022-09-28 DIAGNOSIS — C8191 Hodgkin lymphoma, unspecified, lymph nodes of head, face, and neck: Secondary | ICD-10-CM | POA: Insufficient documentation

## 2022-09-28 DIAGNOSIS — C8178 Other classical Hodgkin lymphoma, lymph nodes of multiple sites: Secondary | ICD-10-CM

## 2022-09-28 DIAGNOSIS — Z08 Encounter for follow-up examination after completed treatment for malignant neoplasm: Secondary | ICD-10-CM | POA: Diagnosis not present

## 2022-09-28 DIAGNOSIS — G629 Polyneuropathy, unspecified: Secondary | ICD-10-CM | POA: Insufficient documentation

## 2022-09-28 DIAGNOSIS — D509 Iron deficiency anemia, unspecified: Secondary | ICD-10-CM | POA: Diagnosis not present

## 2022-09-28 LAB — FERRITIN: Ferritin: 14 ng/mL — ABNORMAL LOW (ref 24–336)

## 2022-09-28 LAB — IRON AND TIBC
Iron: 47 ug/dL (ref 45–182)
Saturation Ratios: 11 % — ABNORMAL LOW (ref 17.9–39.5)
TIBC: 417 ug/dL (ref 250–450)
UIBC: 370 ug/dL

## 2022-09-28 NOTE — Progress Notes (Signed)
No concerns. 

## 2022-09-29 ENCOUNTER — Encounter: Payer: Self-pay | Admitting: Oncology

## 2022-10-04 ENCOUNTER — Encounter: Payer: Self-pay | Admitting: Oncology

## 2022-10-04 NOTE — Progress Notes (Signed)
Hematology/Oncology Consult note [facility]  Telephone:([phone] [phone]  Patient Care Team: [place] Pink, MD as PCP - General (Family Medicine) Sindy Guadeloupe, MD as Consulting Physician (Oncology)   Name of the patient: Erik Buchanan  [id number]  [birth year]   Date of visit: 10/04/22  Diagnosis-  stage III classical Hodgkin's lymphoma     Chief complaint/ Reason for visit- routine f/u of hodgkin   s disease  Heme/Onc history: patient is a 62 year old male with a past medical history significant for hypertension and diabetes among other medical problems.  He was noted to have left-sided neck swelling which has been ongoing for the last 4 months but he feels that the swelling has slowly increased in size.  He was seen by Dr. Kary Kos and underwent CT chest and CT soft tissue neck after initial ultrasound of the neck.  CT showed bulky supraclavicular adenopathy with the largest node measuring 4.1 cm CT chest with contrast also showed similar findings and no evidence of right supraclavicular axillary mediastinal or hilar adenopathy.  Lungs were clear.  Multiple hypodense lesions in the spleen which nearly replaces the splenic parenchyma.  Spleen however does not appear enlarged.    PET CT scan showed FDG avid adenopathy within the lower left neck to left supraclavicular region as well as small bowel mesentery peritoneum and porta hepatic lesions.  Multiple FDG avid splenic lesions.  Mild diffuse increased uptake in the bone marrow nonspecific.  No focal areas of increased uptake to suggest bone metastases.   Excisional supraclavicular lymph node biopsy showed classical Hodgkin's lymphoma. The H&E stained sections reveal lymph node effaced by a somewhat nodular proliferation of large, abnormal lymphoid cells, including "mummified" cells, "lacunar" cells, and occasional binucleated cells with classic Reed-Sternberg morphology, in a background of  predominantly small lymphocytes.  EBV negative   Stage III classical Hodgkin's lymphoma IPI score 2.5-year freedom from progression of 80% and 91% overall survival.   Patient completed 6 cycles of BV AVD chemotherapy on 11/11/2021.  However he did not receive cycle 6-day 15 due to worsening neuropathy      Interval history-neuropathy has improved overall and he has not been on any medications.  He does report some symptoms of heartburn.  He has not seen GI yet.  He is currently on Protonix.  ECOG PS- 0 Pain scale- 0   Review of systems- Review of Systems  Constitutional:  Negative for chills, fever, malaise/fatigue and weight loss.  HENT:  Negative for congestion, ear discharge and nosebleeds.   Eyes:  Negative for blurred vision.  Respiratory:  Negative for cough, hemoptysis, sputum production, shortness of breath and wheezing.   Cardiovascular:  Negative for chest pain, palpitations, orthopnea and claudication.  Gastrointestinal:  Positive for heartburn. Negative for abdominal pain, blood in stool, constipation, diarrhea, melena, nausea and vomiting.  Genitourinary:  Negative for dysuria, flank pain, frequency, hematuria and urgency.  Musculoskeletal:  Negative for back pain, joint pain and myalgias.  Skin:  Negative for rash.  Neurological:  Negative for dizziness, tingling, focal weakness, seizures, weakness and headaches.  Endo/Heme/Allergies:  Does not bruise/bleed easily.  Psychiatric/Behavioral:  Negative for depression and suicidal ideas. The patient does not have insomnia.       Allergies  Allergen Reactions   Ultram [Tramadol Hcl] Other (See Comments)    vertigo and passed out     Past Medical History:  Diagnosis Date   Anemia    Colon polyps  Depression    Diabetes mellitus without complication (HCC)    GERD (gastroesophageal reflux disease)    Hemorrhoids    Hodgkin's lymphoma (HCC)    Hyperlipidemia    Hypertension    Pneumonia    Testicular hypofunction     Tinnitus      Past Surgical History:  Procedure Laterality Date   COLONOSCOPY WITH PROPOFOL N/A 04/07/2015   Procedure: COLONOSCOPY WITH PROPOFOL;  Surgeon: Hulen Luster, MD;  Location: [facility] ENDOSCOPY;  Service: Gastroenterology;  Laterality: N/A;   COLONOSCOPY WITH PROPOFOL N/A 09/01/2020   Procedure: COLONOSCOPY WITH PROPOFOL;  Surgeon: Toledo, Benay Pike, MD;  Location: [facility] ENDOSCOPY;  Service: Gastroenterology;  Laterality: N/A;   EYE SURGERY     cataract   MASS BIOPSY Left 05/07/2021   Procedure: NECK MASS BIOPSY,;  Surgeon: Jules Husbands, MD;  Location: [facility] ORS;  Service: General;  Laterality: Left;  Provider requesting 1.5 hours / 90 minutes for procedure.   PORTACATH PLACEMENT Right 05/21/2021   Procedure: INSERTION PORT-A-CATH;  Surgeon: Jules Husbands, MD;  Location: [facility] ORS;  Service: General;  Laterality: Right;    Social History   Socioeconomic History   Marital status: Married    Spouse name: [name]   Number of children: Not on file   Years of education: Not on file   Highest education level: Not on file  Occupational History   Not on file  Tobacco Use   Smoking status: Never   Smokeless tobacco: Never  Vaping Use   Vaping Use: Never used  Substance and Sexual Activity   Alcohol use: Never   Drug use: Never   Sexual activity: Yes  Other Topics Concern   Not on file  Social History Narrative   Not on file   Social Determinants of Health   Financial Resource Strain: Not on file  Food Insecurity: Not on file  Transportation Needs: Not on file  Physical Activity: Not on file  Stress: Not on file  Social Connections: Not on file  Intimate Partner Violence: Not on file    Family History  Problem Relation Age of Onset   Arthritis Mother    Stroke Paternal Aunt      Current Outpatient Medications:    amLODipine (NORVASC) 5 MG tablet, Take 7.5 mg by mouth daily., Disp: , Rfl:    azelastine (ASTELIN) 0.1 % nasal spray, Place 1 spray into both  nostrils 2 (two) times daily as needed for rhinitis. Use in each nostril as directed, Disp: , Rfl:    glipiZIDE (GLUCOTROL) 10 MG tablet, Take 10 mg by mouth at bedtime., Disp: , Rfl:    Homeopathic Products (LEG CRAMPS) TABS, Take by mouth as needed. Pt taking for leg cramps., Disp: , Rfl:    ibuprofen (ADVIL,MOTRIN) 200 MG tablet, Take 200-400 mg by mouth every 6 (six) hours as needed for moderate pain., Disp: , Rfl:    Iron-Vitamin C (VITRON-C) 65-125 MG TABS, Take 1 tablet by mouth daily. For iron deficiency, Disp: 30 tablet, Rfl: 2   lisinopril (PRINIVIL,ZESTRIL) 10 MG tablet, Take 15 mg by mouth daily., Disp: , Rfl:    metFORMIN (GLUCOPHAGE) 500 MG tablet, Take 1,000 mg by mouth 2 (two) times daily., Disp: , Rfl:    Multiple Vitamin (MULTIVITAMIN PO), Take 1 Package by mouth daily., Disp: , Rfl:    pantoprazole (PROTONIX) 20 MG tablet, Take 1 tablet (20 mg total) by mouth daily., Disp: 30 tablet, Rfl: 1   simvastatin (ZOCOR) 20 MG  tablet, Take 20 mg by mouth every evening., Disp: , Rfl:    sodium chloride (OCEAN) 0.65 % SOLN nasal spray, Place 1 spray into both nostrils as needed for congestion., Disp: , Rfl:    tadalafil (CIALIS) 20 MG tablet, Take 20 mg by mouth daily as needed., Disp: , Rfl:    tetrahydrozoline-zinc (VISINE-AC) 0.05-0.25 % ophthalmic solution, Place 2 drops into both eyes 3 (three) times daily as needed (dry eyes)., Disp: , Rfl:    Triamcinolone Acetonide (NASACORT AQ NA), Place 2 sprays into both nostrils daily as needed (allergies)., Disp: , Rfl:    trolamine salicylate (ASPERCREME) 10 % cream, Apply 1 application  topically as needed for muscle pain., Disp: , Rfl:    buPROPion (WELLBUTRIN XL) 150 MG 24 hr tablet, Take 450 mg by mouth daily. (Patient not taking: Reported on 09/28/2022), Disp: , Rfl:  No current facility-administered medications for this visit.  Facility-Administered Medications Ordered in Other Visits:    sodium chloride flush (NS) 0.9 % injection 10 mL,  10 mL, Intravenous, PRN, Sindy Guadeloupe, MD, 10 mL at 11/11/21 0835  Physical exam:  Vitals:   09/28/22 1511  BP: 139/80  Pulse: 65  Resp: 18  Temp: 98.7 F (37.1 C)  TempSrc: Tympanic  SpO2: 100%  Weight: 252 lb 3.2 oz (114.4 kg)  Height: 5\' 11"  (1.803 m)   Physical Exam Cardiovascular:     Rate and Rhythm: Normal rate and regular rhythm.     Heart sounds: Normal heart sounds.  Pulmonary:     Effort: Pulmonary effort is normal.     Breath sounds: Normal breath sounds.  Abdominal:     General: Bowel sounds are normal.     Palpations: Abdomen is soft.  Lymphadenopathy:     Comments: No palpable cervical, supraclavicular, axillary or inguinal adenopathy    Skin:    General: Skin is warm and dry.  Neurological:     Mental Status: He is alert and oriented to person, place, and time.         Latest Ref Rng & Units 06/21/2022    2:33 PM  CMP  Glucose 70 - 99 mg/dL 154   BUN 6 - 20 mg/dL 17   Creatinine 0.61 - 1.24 mg/dL 1.03   Sodium 135 - 145 mmol/L 137   Potassium 3.5 - 5.1 mmol/L 3.9   Chloride 98 - 111 mmol/L 103   CO2 22 - 32 mmol/L 24   Calcium 8.9 - 10.3 mg/dL 8.6   Total Protein 6.5 - 8.1 g/dL 6.6   Total Bilirubin 0.3 - 1.2 mg/dL 0.3   Alkaline Phos 38 - 126 U/L 50   AST 15 - 41 U/L 23   ALT 0 - 44 U/L 32       Latest Ref Rng & Units 06/21/2022    2:33 PM  CBC  WBC 4.0 - 10.5 K/uL 6.1   Hemoglobin 13.0 - 17.0 g/dL 12.5   Hematocrit 39.0 - 52.0 % 36.3   Platelets 150 - 400 K/uL 229      Assessment and plan- Patient is a 62 y.o. male with history of stage III classical Hodgkin's lymphoma s/p 6 cycles of BV AVD chemotherapy.  He is currently in CR 1 and this is a routine surveillance visit  Clinically patient is doing well with no concerning signs and symptoms of recurrence on today's exam.  Patient does not have any B symptoms.  No palpable adenopathy or hepatosplenomegaly.  I will see  him back in 3 months with repeat scans.  Patient did have  evidenceOf iron deficiency anemia when seen by NP Beckey Rutter 3 months ago.  He is on oral iron but his iron studies continue to be low.  These came back after the patient had left the clinic.  I have sent a patient message asking if he would like to get IV iron at this time.  Also given his symptoms of heartburn and iron deficiency I would like him to see GI.  I have informed him about that through patient message as well.   Visit Diagnosis 1. Encounter for follow-up surveillance of Hodgkin's disease   2. Iron deficiency anemia, unspecified iron deficiency anemia type      Dr. Randa Evens, MD, MPH [facility] [id number] 10/04/2022 8:42 AM

## 2022-10-31 NOTE — Telephone Encounter (Signed)
See my note as to why he needs to see GI and get in touch with [facility]. Megan- please set him up for IV iron soon

## 2022-11-03 ENCOUNTER — Other Ambulatory Visit: Payer: Self-pay | Admitting: Nurse Practitioner

## 2022-11-03 ENCOUNTER — Inpatient Hospital Stay: Payer: BC Managed Care – PPO | Attending: Nurse Practitioner

## 2022-11-03 VITALS — BP 155/78 | HR 85 | Temp 97.7°F | Resp 18

## 2022-11-03 DIAGNOSIS — D509 Iron deficiency anemia, unspecified: Secondary | ICD-10-CM | POA: Diagnosis not present

## 2022-11-03 DIAGNOSIS — C8191 Hodgkin lymphoma, unspecified, lymph nodes of head, face, and neck: Secondary | ICD-10-CM | POA: Insufficient documentation

## 2022-11-03 MED ORDER — SODIUM CHLORIDE 0.9 % IV SOLN
200.0000 mg | Freq: Once | INTRAVENOUS | Status: AC
  Administered 2022-11-03: 200 mg via INTRAVENOUS
  Filled 2022-11-03: qty 10

## 2022-11-03 MED ORDER — SODIUM CHLORIDE 0.9 % IV SOLN
Freq: Once | INTRAVENOUS | Status: AC
  Filled 2022-11-03: qty 250

## 2022-11-03 NOTE — Patient Instructions (Signed)

## 2022-11-03 NOTE — Progress Notes (Signed)
Orders for venofer entered on behalf of Dr Smith Robert

## 2022-11-04 MED FILL — Iron Sucrose Inj 20 MG/ML (Fe Equiv): INTRAVENOUS | Qty: 10 | Status: AC

## 2022-11-05 ENCOUNTER — Inpatient Hospital Stay: Payer: BC Managed Care – PPO

## 2022-11-05 DIAGNOSIS — D509 Iron deficiency anemia, unspecified: Secondary | ICD-10-CM | POA: Diagnosis not present

## 2022-11-05 MED ORDER — SODIUM CHLORIDE 0.9 % IV SOLN
Freq: Once | INTRAVENOUS | Status: AC
  Filled 2022-11-05: qty 250

## 2022-11-05 MED ORDER — SODIUM CHLORIDE 0.9 % IV SOLN
200.0000 mg | Freq: Once | INTRAVENOUS | Status: AC
  Administered 2022-11-05: 200 mg via INTRAVENOUS
  Filled 2022-11-05: qty 200

## 2022-11-05 NOTE — Progress Notes (Signed)
Patient declined to wait the 30 minutes for post iron infusion observation today.  Post iron education done. Patient verbalized understanding. 

## 2022-11-08 ENCOUNTER — Inpatient Hospital Stay: Payer: BC Managed Care – PPO

## 2022-11-08 VITALS — BP 145/68 | HR 72 | Temp 96.1°F | Resp 16

## 2022-11-08 DIAGNOSIS — D509 Iron deficiency anemia, unspecified: Secondary | ICD-10-CM

## 2022-11-08 MED ORDER — SODIUM CHLORIDE 0.9 % IV SOLN
200.0000 mg | Freq: Once | INTRAVENOUS | Status: AC
  Administered 2022-11-08: 200 mg via INTRAVENOUS
  Filled 2022-11-08: qty 200

## 2022-11-08 MED ORDER — SODIUM CHLORIDE 0.9 % IV SOLN
Freq: Once | INTRAVENOUS | Status: AC
  Filled 2022-11-08: qty 250

## 2022-11-08 NOTE — Patient Instructions (Signed)

## 2022-11-08 NOTE — Progress Notes (Signed)
Refused 30 minute post observation. Vitals stable at discharge.  

## 2022-11-09 MED FILL — Iron Sucrose Inj 20 MG/ML (Fe Equiv): INTRAVENOUS | Qty: 10 | Status: AC

## 2022-11-10 ENCOUNTER — Inpatient Hospital Stay: Payer: BC Managed Care – PPO | Attending: Nurse Practitioner

## 2022-11-10 VITALS — BP 139/69 | HR 77 | Temp 98.0°F | Resp 17

## 2022-11-10 DIAGNOSIS — C8191 Hodgkin lymphoma, unspecified, lymph nodes of head, face, and neck: Secondary | ICD-10-CM | POA: Diagnosis present

## 2022-11-10 DIAGNOSIS — D509 Iron deficiency anemia, unspecified: Secondary | ICD-10-CM | POA: Diagnosis present

## 2022-11-10 MED ORDER — SODIUM CHLORIDE 0.9 % IV SOLN
200.0000 mg | Freq: Once | INTRAVENOUS | Status: AC
  Administered 2022-11-10: 200 mg via INTRAVENOUS
  Filled 2022-11-10: qty 200

## 2022-11-10 MED ORDER — SODIUM CHLORIDE 0.9 % IV SOLN
Freq: Once | INTRAVENOUS | Status: AC
  Filled 2022-11-10: qty 250

## 2022-11-11 MED FILL — Iron Sucrose Inj 20 MG/ML (Fe Equiv): INTRAVENOUS | Qty: 10 | Status: AC

## 2022-11-12 ENCOUNTER — Inpatient Hospital Stay: Payer: BC Managed Care – PPO

## 2022-11-12 VITALS — BP 149/68 | HR 72 | Temp 98.6°F | Resp 18

## 2022-11-12 DIAGNOSIS — D509 Iron deficiency anemia, unspecified: Secondary | ICD-10-CM | POA: Diagnosis not present

## 2022-11-12 MED ORDER — SODIUM CHLORIDE 0.9 % IV SOLN
200.0000 mg | Freq: Once | INTRAVENOUS | Status: AC
  Administered 2022-11-12: 200 mg via INTRAVENOUS
  Filled 2022-11-12: qty 200

## 2022-11-12 MED ORDER — SODIUM CHLORIDE 0.9 % IV SOLN
Freq: Once | INTRAVENOUS | Status: AC
  Filled 2022-11-12: qty 250

## 2022-12-17 ENCOUNTER — Encounter: Payer: Self-pay | Admitting: Oncology

## 2022-12-23 ENCOUNTER — Other Ambulatory Visit: Payer: Self-pay

## 2022-12-24 ENCOUNTER — Encounter: Payer: Self-pay | Admitting: Oncology

## 2022-12-28 ENCOUNTER — Ambulatory Visit: Admission: RE | Admit: 2022-12-28 | Payer: BC Managed Care – PPO | Source: Ambulatory Visit

## 2022-12-31 ENCOUNTER — Ambulatory Visit: Payer: BC Managed Care – PPO | Admitting: Oncology

## 2022-12-31 ENCOUNTER — Other Ambulatory Visit: Payer: BC Managed Care – PPO

## 2023-01-03 ENCOUNTER — Other Ambulatory Visit: Payer: BC Managed Care – PPO

## 2023-01-03 ENCOUNTER — Ambulatory Visit: Payer: BC Managed Care – PPO | Admitting: Oncology

## 2023-01-06 ENCOUNTER — Other Ambulatory Visit: Payer: BC Managed Care – PPO

## 2023-01-07 ENCOUNTER — Ambulatory Visit
Admission: RE | Admit: 2023-01-07 | Discharge: 2023-01-07 | Disposition: A | Discharge status: Home / Self Care | Payer: BC Managed Care – PPO | Source: Ambulatory Visit | Attending: Oncology | Admitting: Oncology

## 2023-01-07 DIAGNOSIS — Z8571 Personal history of Hodgkin lymphoma: Secondary | ICD-10-CM | POA: Diagnosis present

## 2023-01-07 DIAGNOSIS — Z08 Encounter for follow-up examination after completed treatment for malignant neoplasm: Secondary | ICD-10-CM | POA: Diagnosis present

## 2023-01-07 DIAGNOSIS — D509 Iron deficiency anemia, unspecified: Secondary | ICD-10-CM | POA: Diagnosis present

## 2023-01-07 LAB — POCT I-STAT CREATININE: Creatinine, Ser: 1.1 mg/dL (ref 0.61–1.24)

## 2023-01-07 MED ORDER — IOHEXOL 300 MG/ML  SOLN
100.0000 mL | Freq: Once | INTRAMUSCULAR | Status: AC | PRN
  Administered 2023-01-07: 100 mL via INTRAVENOUS

## 2023-01-18 IMAGING — CT CT CHEST W/ CM
2 of 3 series · 15 of 36 positions shown, 18 images · IV contrast (omnipaque)
Comparison: None.

CLINICAL DATA: Left neck/supraclavicular mass, lymphadenopathy

EXAM:
CT CHEST WITH CONTRAST
TECHNIQUE: Multidetector CT imaging of the chest was performed during
intravenous contrast administration.
CONTRAST:  75mL OMNIPAQUE IOHEXOL 350 MG/ML SOLN

[Series 2: axial st · axial · 0.79mm/px · z∈[-27,+243]mm · 12 of 64 slices shown, 15 images]
[im 5/64  mediastinal]
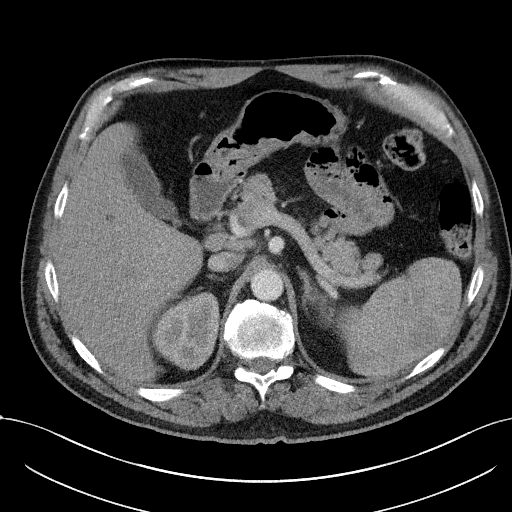
[im 5/64  lung]
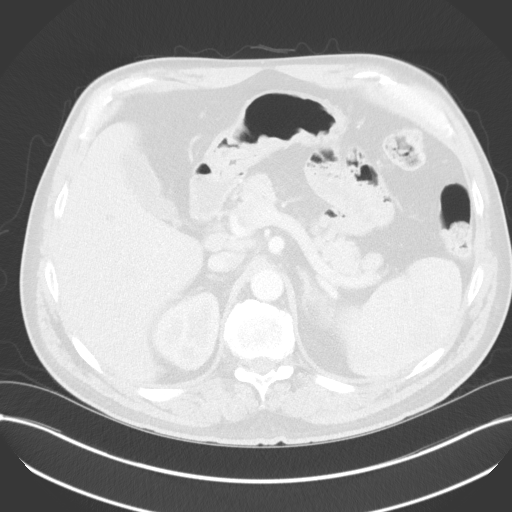
[im 10/64  lung]
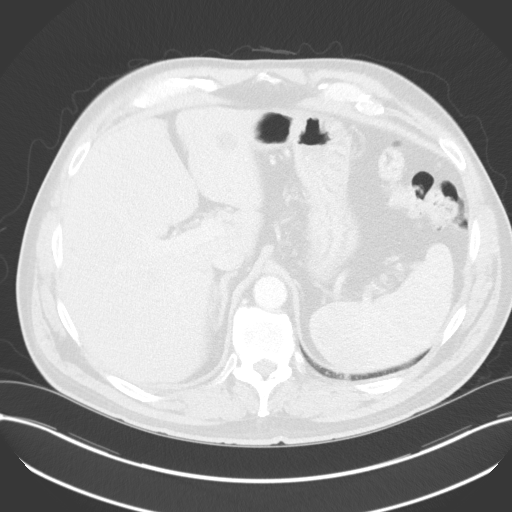
[im 15/64  lung]
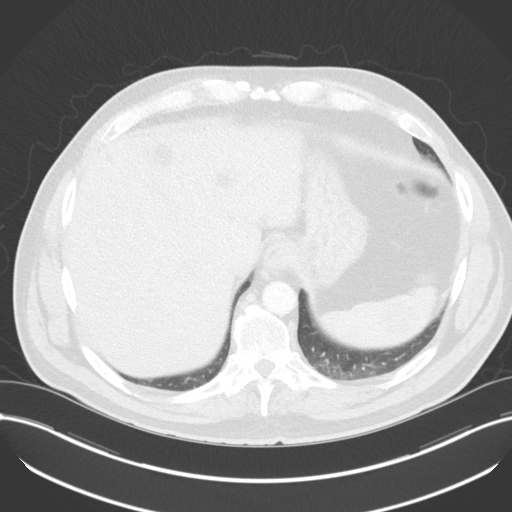
[im 19/64  lung]
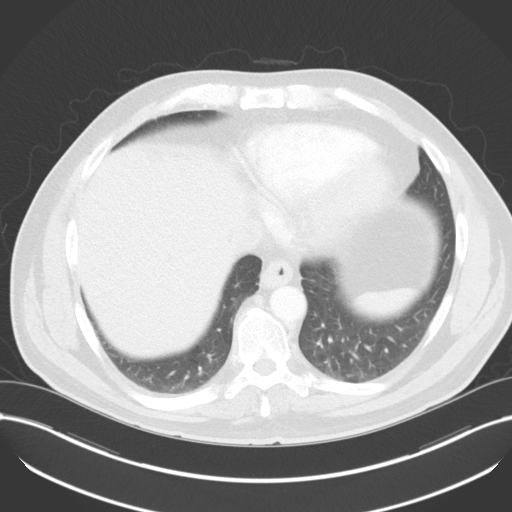
[im 24/64  mediastinal]
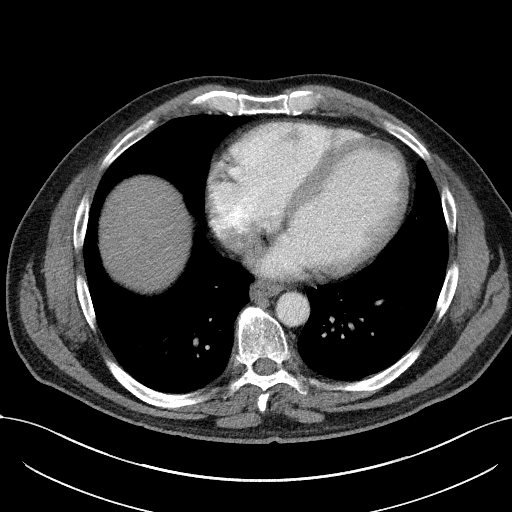
[im 24/64  lung]
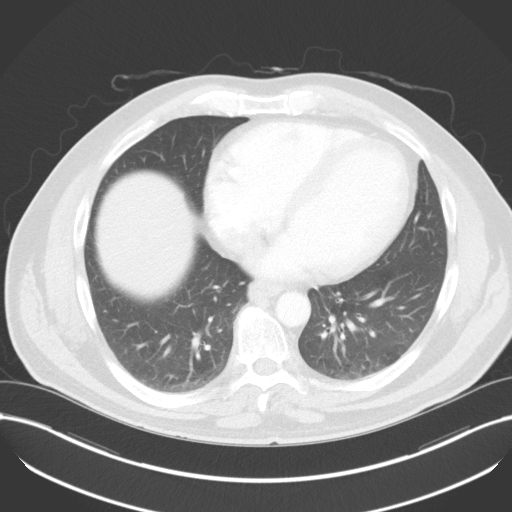
[im 29/64  lung]
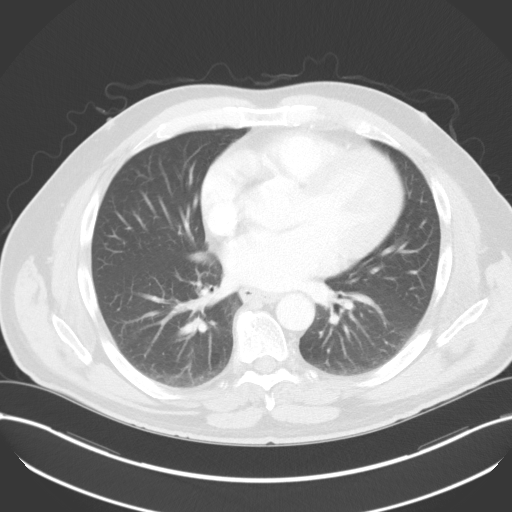
[im 36/64  lung]
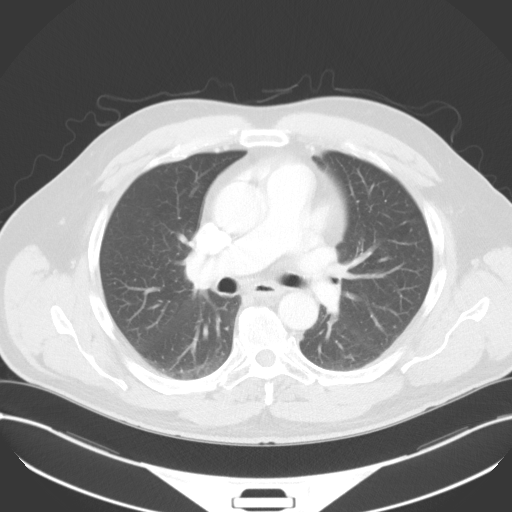
[im 40/64  lung]
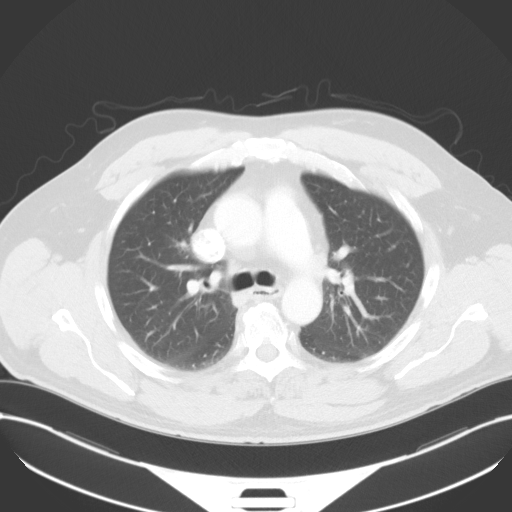
[im 45/64  mediastinal]
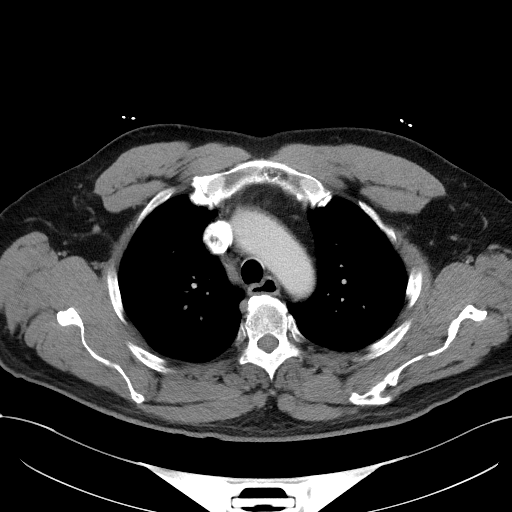
[im 45/64  lung]
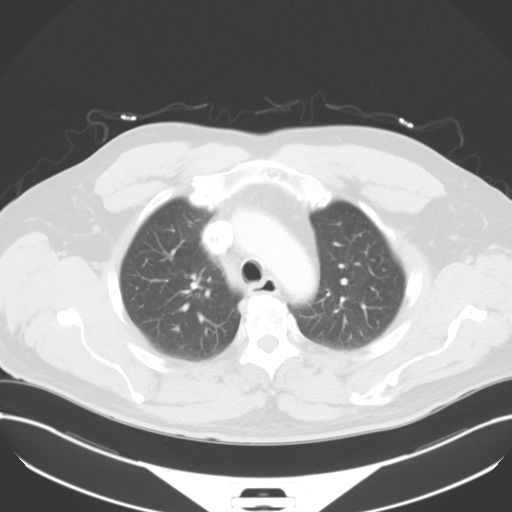
[im 50/64  lung]
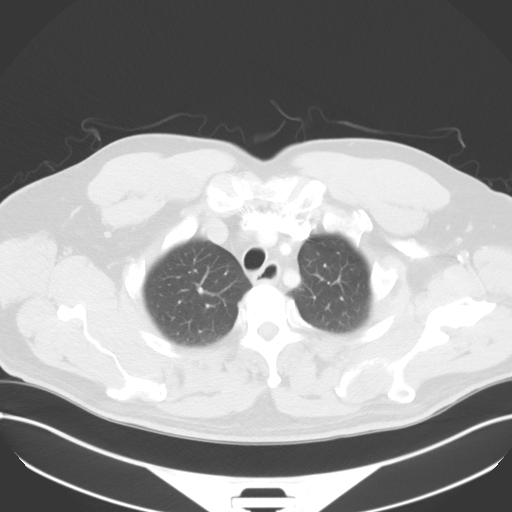
[im 54/64  lung]
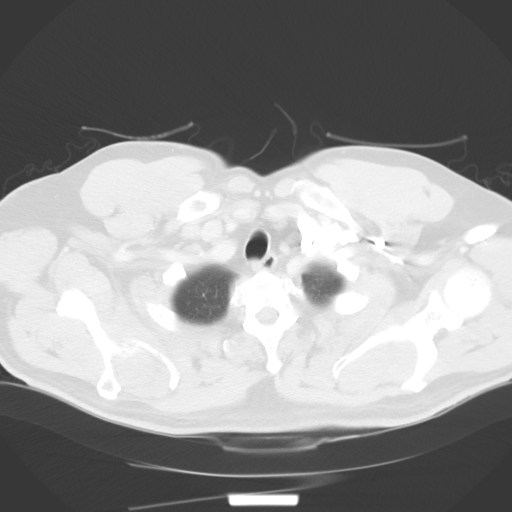
[im 59/64  lung]
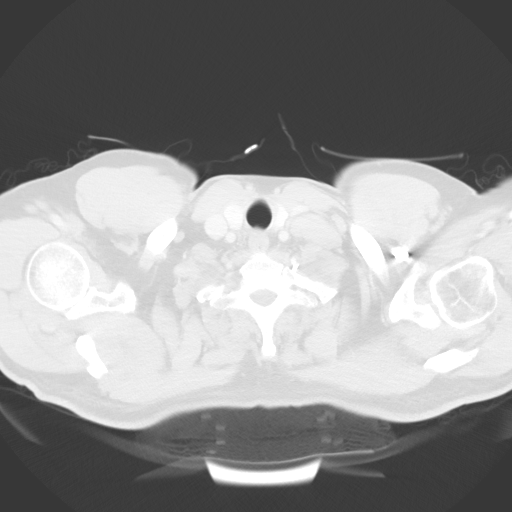

[Series 5: coronal · coronal · 0.66mm/px · 3 of 152 slices shown]
[im 31/152  lung]
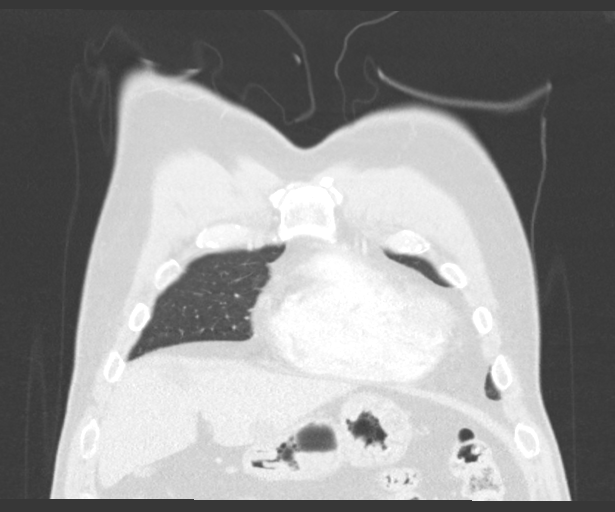
[im 61/152  lung]
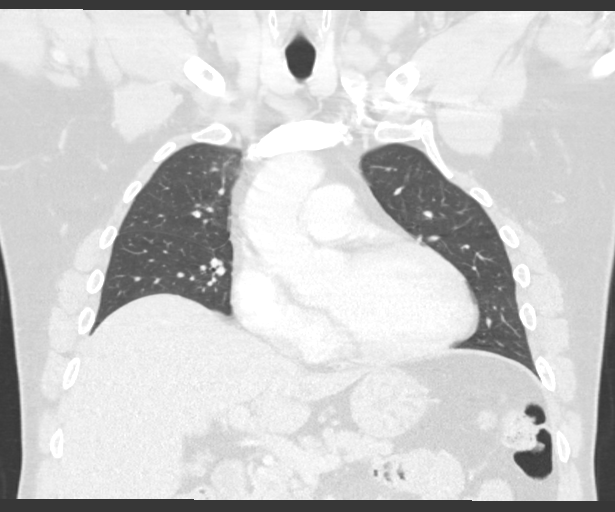
[im 91/152  lung]
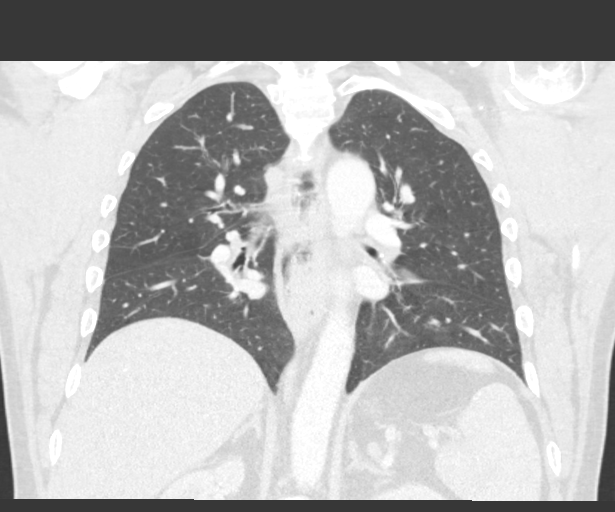

[15 of 36 positions shown; findings below may reference images not displayed]

FINDINGS: Cardiovascular: No significant vascular findings. Normal heart size.
No pericardial effusion.

Mediastinum/Nodes: There is pathologic lower jugular and medial
supraclavicular lymphadenopathy with the index lymph node measuring
3.0 x 4.2 cm at axial image # [DATE]. No pathologic right
supraclavicular, axillary, mediastinal, or hilar adenopathy. The
visualized thyroid is unremarkable. The esophagus is unremarkable.

Lungs/Pleura: Lungs are clear. No pleural effusion or pneumothorax.

Upper Abdomen: Multiple scattered low-attenuation lesions are seen
throughout the visualized liver, the largest of which are compatible
with simple cysts. There are multiple hypodense lesions throughout
the spleen, measuring up to 2.9 x 3.2 cm at axial image # 58/2.
These hypoenhancing masses nearly replaces the splenic parenchyma.
The spleen, however, does not appear enlarged.

Musculoskeletal: Intraosseous lipoma within the sternum. No
suspicious lytic or blastic bone lesion. No acute bone abnormality.
IMPRESSION: Pathologic adenopathy within the left supraclavicular and lower
jugular lymph node groups as well as innumerable hypoenhancing
masses throughout the spleen all together most in keeping with a
lymphoproliferative process such as lymphoma. PET CT examination may
be helpful for staging purposes. Left lower jugular pathologic
adenopathy should be easily amenable to ultrasound-guided tissue
sampling for further evaluation.

## 2023-01-24 ENCOUNTER — Encounter: Payer: Self-pay | Admitting: Oncology

## 2023-01-24 ENCOUNTER — Inpatient Hospital Stay: Payer: BC Managed Care – PPO | Attending: Nurse Practitioner

## 2023-01-24 ENCOUNTER — Inpatient Hospital Stay (HOSPITAL_BASED_OUTPATIENT_CLINIC_OR_DEPARTMENT_OTHER): Payer: BC Managed Care – PPO | Admitting: Oncology

## 2023-01-24 VITALS — BP 138/90 | HR 80 | Temp 97.5°F | Resp 18 | Ht 71.0 in | Wt 253.7 lb

## 2023-01-24 DIAGNOSIS — D509 Iron deficiency anemia, unspecified: Secondary | ICD-10-CM

## 2023-01-24 DIAGNOSIS — C8191 Hodgkin lymphoma, unspecified, lymph nodes of head, face, and neck: Secondary | ICD-10-CM | POA: Insufficient documentation

## 2023-01-24 DIAGNOSIS — Z9221 Personal history of antineoplastic chemotherapy: Secondary | ICD-10-CM | POA: Diagnosis not present

## 2023-01-24 DIAGNOSIS — E785 Hyperlipidemia, unspecified: Secondary | ICD-10-CM | POA: Insufficient documentation

## 2023-01-24 DIAGNOSIS — Z79899 Other long term (current) drug therapy: Secondary | ICD-10-CM | POA: Diagnosis not present

## 2023-01-24 DIAGNOSIS — E1140 Type 2 diabetes mellitus with diabetic neuropathy, unspecified: Secondary | ICD-10-CM | POA: Insufficient documentation

## 2023-01-24 DIAGNOSIS — K219 Gastro-esophageal reflux disease without esophagitis: Secondary | ICD-10-CM | POA: Insufficient documentation

## 2023-01-24 DIAGNOSIS — I10 Essential (primary) hypertension: Secondary | ICD-10-CM | POA: Insufficient documentation

## 2023-01-24 DIAGNOSIS — Z7984 Long term (current) use of oral hypoglycemic drugs: Secondary | ICD-10-CM | POA: Insufficient documentation

## 2023-01-24 DIAGNOSIS — Z08 Encounter for follow-up examination after completed treatment for malignant neoplasm: Secondary | ICD-10-CM

## 2023-01-24 DIAGNOSIS — C8178 Other classical Hodgkin lymphoma, lymph nodes of multiple sites: Secondary | ICD-10-CM

## 2023-01-24 LAB — CMP (CANCER CENTER ONLY)
ALT: 27 U/L (ref 0–44)
AST: 16 U/L (ref 15–41)
Albumin: 4.3 g/dL (ref 3.5–5.0)
Alkaline Phosphatase: 62 U/L (ref 38–126)
Anion gap: 9 (ref 5–15)
BUN: 17 mg/dL (ref 6–20)
CO2: 25 mmol/L (ref 22–32)
Calcium: 9.1 mg/dL (ref 8.9–10.3)
Chloride: 103 mmol/L (ref 98–111)
Creatinine: 1.02 mg/dL (ref 0.61–1.24)
GFR, Estimated: >60 mL/min (ref >60)
Glucose, Bld: 124 mg/dL — ABNORMAL HIGH (ref 70–99)
Potassium: 4 mmol/L (ref 3.5–5.1)
Sodium: 137 mmol/L (ref 135–145)
Total Bilirubin: 0.3 mg/dL (ref 0.3–1.2)
Total Protein: 7.3 g/dL (ref 6.5–8.1)

## 2023-01-24 LAB — CBC WITH DIFFERENTIAL (CANCER CENTER ONLY)
Abs Immature Granulocytes: 0.02 10*3/uL (ref 0.00–0.07)
Basophils Absolute: 0 10*3/uL (ref 0.0–0.1)
Basophils Relative: 0 %
Eosinophils Absolute: 0.1 10*3/uL (ref 0.0–0.5)
Eosinophils Relative: 2 %
HCT: 36.9 % — ABNORMAL LOW (ref 39.0–52.0)
Hemoglobin: 12.7 g/dL — ABNORMAL LOW (ref 13.0–17.0)
Immature Granulocytes: 0 %
Lymphocytes Relative: 23 %
Lymphs Abs: 2.2 10*3/uL (ref 0.7–4.0)
MCH: 28.7 pg (ref 26.0–34.0)
MCHC: 34.4 g/dL (ref 30.0–36.0)
MCV: 83.3 fL (ref 80.0–100.0)
Monocytes Absolute: 0.6 10*3/uL (ref 0.1–1.0)
Monocytes Relative: 7 %
Neutro Abs: 6.5 10*3/uL (ref 1.7–7.7)
Neutrophils Relative %: 68 %
Platelet Count: 272 10*3/uL (ref 150–400)
RBC: 4.43 MIL/uL (ref 4.22–5.81)
RDW: 13.2 % (ref 11.5–15.5)
WBC Count: 9.4 10*3/uL (ref 4.0–10.5)
nRBC: 0 % (ref 0.0–0.2)

## 2023-01-24 LAB — IRON AND TIBC
Iron: 61 ug/dL (ref 45–182)
Saturation Ratios: 17 % — ABNORMAL LOW (ref 17.9–39.5)
TIBC: 370 ug/dL (ref 250–450)
UIBC: 309 ug/dL

## 2023-01-24 LAB — FERRITIN: Ferritin: 87 ng/mL (ref 24–336)

## 2023-01-24 LAB — LACTATE DEHYDROGENASE: LDH: 126 U/L (ref 98–192)

## 2023-01-24 NOTE — Progress Notes (Unsigned)
Hematology/Oncology Consult note [facility]  Telephone:([phone] [phone]  Patient Care Team: [name], MD as PCP - General (Family Medicine) Creig Hines, MD as Consulting Physician (Oncology)   Name of the patient: Erik Buchanan  [id number]  03-02-1961   Date of visit: 01/24/23  Diagnosis- stage III classical Hodgkin's lymphoma currently in CR 1  Chief complaint/ Reason for visit-routine follow-up of Hodgkin's lymphoma  Heme/Onc history:  patient is a 62 year old male with a past medical history significant for hypertension and diabetes among other medical problems.  He was noted to have left-sided neck swelling which has been ongoing for the last 4 months but he feels that the swelling has slowly increased in size.  He was seen by Dr. Burnett Sheng and underwent CT chest and CT soft tissue neck after initial ultrasound of the neck.  CT showed bulky supraclavicular adenopathy with the largest node measuring 4.1 cm CT chest with contrast also showed similar findings and no evidence of right supraclavicular axillary mediastinal or hilar adenopathy.  Lungs were clear.  Multiple hypodense lesions in the spleen which nearly replaces the splenic parenchyma.  Spleen however does not appear enlarged.    PET CT scan showed FDG avid adenopathy within the lower left neck to left supraclavicular region as well as small bowel mesentery peritoneum and porta hepatic lesions.  Multiple FDG avid splenic lesions.  Mild diffuse increased uptake in the bone marrow nonspecific.  No focal areas of increased uptake to suggest bone metastases.   Excisional supraclavicular lymph node biopsy showed classical Hodgkin's lymphoma. The H&E stained sections reveal lymph node effaced by a somewhat nodular proliferation of large, abnormal lymphoid cells, including "mummified" cells, "lacunar" cells, and occasional binucleated cells with classic Reed-Sternberg morphology, in a  background of predominantly small lymphocytes.  EBV negative   Stage III classical Hodgkin's lymphoma IPI score 2.5-year freedom from progression of 80% and 91% overall survival.   Patient completed 6 cycles of BV AVD chemotherapy on 11/11/2021.  However he did not receive cycle 6-day 15 due to worsening neuropathy    Interval history-patient has moved on to a new job and he is able to function well at the new job without any significant impairments.  Neuropathy has remained stable overall.  Symptoms of GERD are better after starting Protonix.  ECOG PS- 0 Pain scale- 0   Review of systems- Review of Systems  Constitutional:  Negative for chills, fever, malaise/fatigue and weight loss.  HENT:  Negative for congestion, ear discharge and nosebleeds.   Eyes:  Negative for blurred vision.  Respiratory:  Negative for cough, hemoptysis, sputum production, shortness of breath and wheezing.   Cardiovascular:  Negative for chest pain, palpitations, orthopnea and claudication.  Gastrointestinal:  Negative for abdominal pain, blood in stool, constipation, diarrhea, heartburn, melena, nausea and vomiting.  Genitourinary:  Negative for dysuria, flank pain, frequency, hematuria and urgency.  Musculoskeletal:  Negative for back pain, joint pain and myalgias.  Skin:  Negative for rash.  Neurological:  Negative for dizziness, tingling, focal weakness, seizures, weakness and headaches.  Endo/Heme/Allergies:  Does not bruise/bleed easily.  Psychiatric/Behavioral:  Negative for depression and suicidal ideas. The patient does not have insomnia.       Allergies  Allergen Reactions   Ultram [Tramadol Hcl] Other (See Comments)    vertigo and passed out     Past Medical History:  Diagnosis Date   Anemia    Colon polyps    Depression  Diabetes mellitus without complication (HCC)    GERD (gastroesophageal reflux disease)    Hemorrhoids    Hodgkin's lymphoma (HCC)    Hyperlipidemia    Hypertension     Pneumonia    Testicular hypofunction    Tinnitus      Past Surgical History:  Procedure Laterality Date   COLONOSCOPY WITH PROPOFOL N/A 04/07/2015   Procedure: COLONOSCOPY WITH PROPOFOL;  Surgeon: Wallace Cullens, MD;  Location: [facility] ENDOSCOPY;  Service: Gastroenterology;  Laterality: N/A;   COLONOSCOPY WITH PROPOFOL N/A 09/01/2020   Procedure: COLONOSCOPY WITH PROPOFOL;  Surgeon: Toledo, Boykin Nearing, MD;  Location: [facility] ENDOSCOPY;  Service: Gastroenterology;  Laterality: N/A;   EYE SURGERY     cataract   MASS BIOPSY Left 05/07/2021   Procedure: NECK MASS BIOPSY,;  Surgeon: Leafy Ro, MD;  Location: [facility] ORS;  Service: General;  Laterality: Left;  Provider requesting 1.5 hours / 90 minutes for procedure.   PORTACATH PLACEMENT Right 05/21/2021   Procedure: INSERTION PORT-A-CATH;  Surgeon: Leafy Ro, MD;  Location: [facility] ORS;  Service: General;  Laterality: Right;    Social History   Socioeconomic History   Marital status: Married    Spouse name: Steward Drone   Number of children: Not on file   Years of education: Not on file   Highest education level: Not on file  Occupational History   Not on file  Tobacco Use   Smoking status: Never   Smokeless tobacco: Never  Vaping Use   Vaping status: Never Used  Substance and Sexual Activity   Alcohol use: Never   Drug use: Never   Sexual activity: Yes  Other Topics Concern   Not on file  Social History Narrative   Not on file   Social Determinants of Health   Financial Resource Strain: Not on file  Food Insecurity: Not on file  Transportation Needs: Not on file  Physical Activity: Not on file  Stress: Not on file  Social Connections: Not on file  Intimate Partner Violence: Not on file    Family History  Problem Relation Age of Onset   Arthritis Mother    Stroke Paternal Aunt      Current Outpatient Medications:    amLODipine (NORVASC) 5 MG tablet, Take 7.5 mg by mouth daily., Disp: , Rfl:    azelastine (ASTELIN) 0.1 %  nasal spray, Place 1 spray into both nostrils 2 (two) times daily as needed for rhinitis. Use in each nostril as directed, Disp: , Rfl:    glipiZIDE (GLUCOTROL) 10 MG tablet, Take 10 mg by mouth at bedtime., Disp: , Rfl:    Homeopathic Products (LEG CRAMPS) TABS, Take by mouth as needed. Pt taking for leg cramps., Disp: , Rfl:    ibuprofen (ADVIL,MOTRIN) 200 MG tablet, Take 200-400 mg by mouth every 6 (six) hours as needed for moderate pain., Disp: , Rfl:    Iron-Vitamin C (VITRON-C) 65-125 MG TABS, Take 1 tablet by mouth daily. For iron deficiency, Disp: 30 tablet, Rfl: 2   lisinopril (PRINIVIL,ZESTRIL) 10 MG tablet, Take 15 mg by mouth daily., Disp: , Rfl:    metFORMIN (GLUCOPHAGE) 500 MG tablet, Take 1,000 mg by mouth 2 (two) times daily., Disp: , Rfl:    Multiple Vitamin (MULTIVITAMIN PO), Take 1 Package by mouth daily., Disp: , Rfl:    pantoprazole (PROTONIX) 20 MG tablet, Take 1 tablet (20 mg total) by mouth daily., Disp: 30 tablet, Rfl: 1   simvastatin (ZOCOR) 20 MG tablet, Take 20 mg  by mouth every evening., Disp: , Rfl:    sodium chloride (OCEAN) 0.65 % SOLN nasal spray, Place 1 spray into both nostrils as needed for congestion., Disp: , Rfl:    tadalafil (CIALIS) 20 MG tablet, Take 20 mg by mouth daily as needed., Disp: , Rfl:    tetrahydrozoline-zinc (VISINE-AC) 0.05-0.25 % ophthalmic solution, Place 2 drops into both eyes 3 (three) times daily as needed (dry eyes)., Disp: , Rfl:    Triamcinolone Acetonide (NASACORT AQ NA), Place 2 sprays into both nostrils daily as needed (allergies)., Disp: , Rfl:    trolamine salicylate (ASPERCREME) 10 % cream, Apply 1 application  topically as needed for muscle pain., Disp: , Rfl:    buPROPion (WELLBUTRIN XL) 150 MG 24 hr tablet, Take 450 mg by mouth daily. (Patient not taking: Reported on 09/28/2022), Disp: , Rfl:  No current facility-administered medications for this visit.  Facility-Administered Medications Ordered in Other Visits:    sodium  chloride flush (NS) 0.9 % injection 10 mL, 10 mL, Intravenous, PRN, Creig Hines, MD, 10 mL at 11/11/21 0835  Physical exam:  Vitals:   01/24/23 1453  BP: (!) 138/90  Pulse: 80  Resp: 18  Temp: (!) 97.5 F (36.4 C)  TempSrc: Tympanic  SpO2: 97%  Weight: 253 lb 11.2 oz (115.1 kg)  Height: 5\' 11"  (1.803 m)   Physical Exam Cardiovascular:     Rate and Rhythm: Normal rate and regular rhythm.     Heart sounds: Normal heart sounds.  Pulmonary:     Effort: Pulmonary effort is normal.     Breath sounds: Normal breath sounds.  Skin:    General: Skin is warm and dry.  Neurological:     Mental Status: He is alert and oriented to person, place, and time.         Latest Ref Rng & Units 01/24/2023    2:45 PM  CMP  Glucose 70 - 99 mg/dL 604   BUN 6 - 20 mg/dL 17   Creatinine 5.40 - 1.24 mg/dL 9.81   Sodium 191 - 478 mmol/L 137   Potassium 3.5 - 5.1 mmol/L 4.0   Chloride 98 - 111 mmol/L 103   CO2 22 - 32 mmol/L 25   Calcium 8.9 - 10.3 mg/dL 9.1   Total Protein 6.5 - 8.1 g/dL 7.3   Total Bilirubin 0.3 - 1.2 mg/dL 0.3   Alkaline Phos 38 - 126 U/L 62   AST 15 - 41 U/L 16   ALT 0 - 44 U/L 27       Latest Ref Rng & Units 01/24/2023    2:45 PM  CBC  WBC 4.0 - 10.5 K/uL 9.4   Hemoglobin 13.0 - 17.0 g/dL 29.5   Hematocrit 62.1 - 52.0 % 36.9   Platelets 150 - 400 K/uL 272     No images are attached to the encounter.  CT CHEST ABDOMEN PELVIS W CONTRAST  Result Date: 01/07/2023 CLINICAL DATA:  Hodgkin's lymphoma, chemotherapy. Follow-up. * Tracking Code: BO * EXAM: CT CHEST, ABDOMEN, AND PELVIS WITH CONTRAST TECHNIQUE: Multidetector CT imaging of the chest, abdomen and pelvis was performed following the standard protocol during bolus administration of intravenous contrast. RADIATION DOSE REDUCTION: This exam was performed according to the departmental dose-optimization program which includes automated exposure control, adjustment of the mA and/or kV according to patient size and/or  use of iterative reconstruction technique. CONTRAST:  OMNIPAQUE IOHEXOL 300 MG/ML  SOLN COMPARISON:  Multiple priors including most recent CT  June 11, 2022 FINDINGS: CT CHEST FINDINGS Cardiovascular: Normal caliber thoracic aorta. No central pulmonary embolus on this nondedicated study. Normal size heart. No significant pericardial effusion/thickening. Mediastinum/Nodes: No suspicious thyroid nodule. No pathologically enlarged mediastinal, hilar or axillary lymph nodes. Patulous esophagus. Lungs/Pleura: No suspicious pulmonary nodules or masses. Mild diffuse bronchial wall thickening. No pleural effusion. No pneumothorax. Musculoskeletal: No aggressive lytic or blastic lesion of bone. Multilevel degenerative changes spine. CT ABDOMEN PELVIS FINDINGS Hepatobiliary: Similar hypodense hepatic lesions common not hyperintense on prior PET-CT likely reflecting cysts. Diffuse hepatic steatosis. Focal fatty sparing along the gallbladder fossa. No biliary ductal dilation. Gallbladder is unremarkable. Pancreas: No pancreatic ductal dilation or evidence of acute inflammation. Spleen: No splenomegaly. Adrenals/Urinary Tract: Bilateral adrenal glands appear normal. No hydronephrosis. Kidneys demonstrate symmetric enhancement. No suspicious renal mass. Urinary bladder is unremarkable for degree of distension. Stomach/Bowel: Small hiatal hernia. No pathologic dilation of small or large bowel. Radiopaque enteric contrast material traverses the cecum. Colonic diverticulosis without findings of acute diverticulitis. Vascular/Lymphatic: Normal caliber abdominal aorta. Smooth IVC contours. Decreased size of the lymph nodes in the jejunal mesentery now measuring up to 7 mm in short axis on image 68/2 previously 13 mm. No newly enlarged abdominal or pelvic lymph nodes identified. Reproductive: Prostate is unremarkable. Other: No significant abdominopelvic free fluid. Musculoskeletal: Unchanged wedge deformity at L1. No  aggressive lytic or blastic lesion of bone. No acute osseous abnormality. Multilevel degenerative changes spine. IMPRESSION: 1. Decreased size of the lymph nodes in the jejunal mesentery. No newly enlarged lymph nodes identified in the chest, abdomen, or pelvis. 2. No splenomegaly 3. Diffuse hepatic steatosis. 4. Colonic diverticulosis without findings of acute diverticulitis. 5. Small hiatal hernia. Electronically Signed   By: Maudry Mayhew M.D.   On: 01/07/2023 16:42     Assessment and plan- Patient is a 62 y.o. male with history of stage III classical Hodgkin's lymphoma s/p 6 cycles of BV AVD chemotherapy.  He is currently in complete remission and this is a routine surveillance visit  I have reviewed CT chest abdomen and pelvis imagesIndependently and discussed findings with the patient which does not show any evidence of recurrent or progressive disease.  He remains in remission as far as his Hodgkin's lymphoma is concerned.  He is now 1 year out of his treatment.  I will hold off on getting scans every 6 months and consider a yearly scan for him.  I will see him back in 6 months with labs and for physical exam.    History of iron deficiency: Patient does report feeling significantly better after receiving IV iron.  Hemoglobin has remained stable around 12.5.  He follows up with [facility] main [facility] is also on Protonix for his GERD.   Visit Diagnosis 1. Encounter for follow-up surveillance of Hodgkin's disease   2. Iron deficiency anemia, unspecified iron deficiency anemia type      Dr. Owens Shark, MD, MPH [facility] [phone] 01/24/2023 4:36 PM

## 2023-01-25 ENCOUNTER — Encounter: Payer: Self-pay | Admitting: Oncology

## 2023-01-25 ENCOUNTER — Telehealth: Payer: Self-pay | Admitting: *Deleted

## 2023-01-25 NOTE — Telephone Encounter (Signed)
Dr. Smith Robert wanted me to call and let patient know that he does not have any issues with iron level and so therefore he does not need any iron infusions at this time.  Patient was happy to hear that he does not have to get any iron.

## 2023-02-22 IMAGING — DX DG CHEST 1V
1 series · 1 of 1 positions shown · non-contrast
Comparison: None.

CLINICAL DATA: Status post Port-A-Cath placement.

EXAM:
CHEST  1 VIEW

[chest ap]
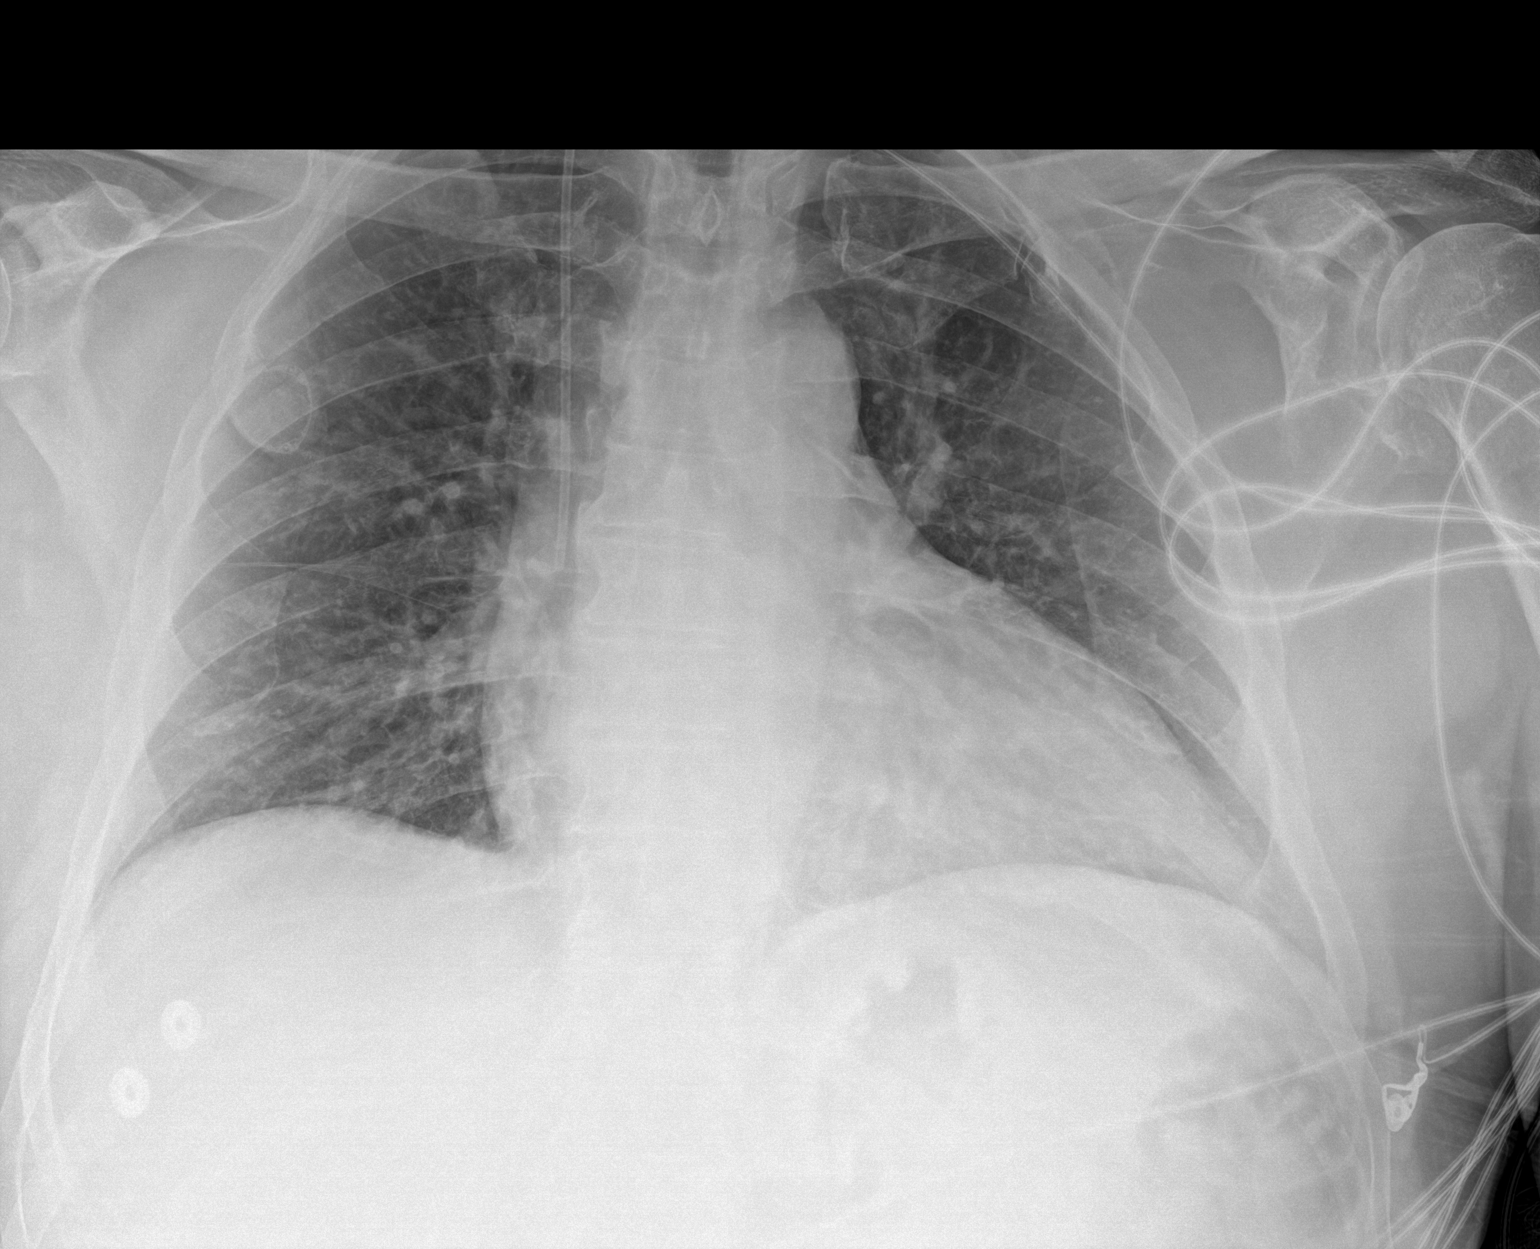

[1 of 1 positions shown; findings below may reference images not displayed]

FINDINGS: The heart size and mediastinal contours are within normal limits.
Both lungs are clear. Interval placement of right internal jugular
Port-A-Cath with distal tip in expected position of the SVC. No
pneumothorax is noted. The visualized skeletal structures are
unremarkable.
IMPRESSION: Interval placement of right internal jugular Port-A-Cath with distal
tip in expected position of the SVC.

## 2023-07-25 ENCOUNTER — Inpatient Hospital Stay: Payer: BC Managed Care – PPO | Admitting: Oncology

## 2023-07-25 ENCOUNTER — Encounter: Payer: Self-pay | Admitting: Oncology

## 2023-07-25 ENCOUNTER — Inpatient Hospital Stay: Payer: BC Managed Care – PPO | Attending: Oncology

## 2023-07-25 VITALS — BP 146/64 | HR 75 | Temp 98.2°F | Resp 18 | Wt 256.0 lb

## 2023-07-25 DIAGNOSIS — I10 Essential (primary) hypertension: Secondary | ICD-10-CM | POA: Insufficient documentation

## 2023-07-25 DIAGNOSIS — E785 Hyperlipidemia, unspecified: Secondary | ICD-10-CM | POA: Insufficient documentation

## 2023-07-25 DIAGNOSIS — C8178 Other classical Hodgkin lymphoma, lymph nodes of multiple sites: Secondary | ICD-10-CM

## 2023-07-25 DIAGNOSIS — Z79899 Other long term (current) drug therapy: Secondary | ICD-10-CM | POA: Insufficient documentation

## 2023-07-25 DIAGNOSIS — Z7984 Long term (current) use of oral hypoglycemic drugs: Secondary | ICD-10-CM | POA: Diagnosis not present

## 2023-07-25 DIAGNOSIS — Z8571 Personal history of Hodgkin lymphoma: Secondary | ICD-10-CM

## 2023-07-25 DIAGNOSIS — Z08 Encounter for follow-up examination after completed treatment for malignant neoplasm: Secondary | ICD-10-CM | POA: Diagnosis not present

## 2023-07-25 DIAGNOSIS — C8170 Other classical Hodgkin lymphoma, unspecified site: Secondary | ICD-10-CM | POA: Insufficient documentation

## 2023-07-25 DIAGNOSIS — D509 Iron deficiency anemia, unspecified: Secondary | ICD-10-CM

## 2023-07-25 DIAGNOSIS — E1140 Type 2 diabetes mellitus with diabetic neuropathy, unspecified: Secondary | ICD-10-CM | POA: Insufficient documentation

## 2023-07-25 DIAGNOSIS — K219 Gastro-esophageal reflux disease without esophagitis: Secondary | ICD-10-CM | POA: Insufficient documentation

## 2023-07-25 LAB — COMPREHENSIVE METABOLIC PANEL
ALT: 36 U/L (ref 0–44)
AST: 22 U/L (ref 15–41)
Albumin: 4.2 g/dL (ref 3.5–5.0)
Alkaline Phosphatase: 49 U/L (ref 38–126)
Anion gap: 10 (ref 5–15)
BUN: 16 mg/dL (ref 6–20)
CO2: 24 mmol/L (ref 22–32)
Calcium: 8.9 mg/dL (ref 8.9–10.3)
Chloride: 102 mmol/L (ref 98–111)
Creatinine, Ser: 1.02 mg/dL (ref 0.61–1.24)
GFR, Estimated: >60 mL/min (ref >60)
Glucose, Bld: 159 mg/dL — ABNORMAL HIGH (ref 70–99)
Potassium: 3.8 mmol/L (ref 3.5–5.1)
Sodium: 136 mmol/L (ref 135–145)
Total Bilirubin: 0.5 mg/dL (ref 0.0–1.2)
Total Protein: 6.8 g/dL (ref 6.5–8.1)

## 2023-07-25 LAB — CBC WITH DIFFERENTIAL/PLATELET
Abs Immature Granulocytes: 0.03 10*3/uL (ref 0.00–0.07)
Basophils Absolute: 0 10*3/uL (ref 0.0–0.1)
Basophils Relative: 0 %
Eosinophils Absolute: 0.2 10*3/uL (ref 0.0–0.5)
Eosinophils Relative: 2 %
HCT: 35.1 % — ABNORMAL LOW (ref 39.0–52.0)
Hemoglobin: 12.3 g/dL — ABNORMAL LOW (ref 13.0–17.0)
Immature Granulocytes: 0 %
Lymphocytes Relative: 30 %
Lymphs Abs: 2.2 10*3/uL (ref 0.7–4.0)
MCH: 29.4 pg (ref 26.0–34.0)
MCHC: 35 g/dL (ref 30.0–36.0)
MCV: 83.8 fL (ref 80.0–100.0)
Monocytes Absolute: 0.4 10*3/uL (ref 0.1–1.0)
Monocytes Relative: 5 %
Neutro Abs: 4.4 10*3/uL (ref 1.7–7.7)
Neutrophils Relative %: 63 %
Platelets: 239 10*3/uL (ref 150–400)
RBC: 4.19 MIL/uL — ABNORMAL LOW (ref 4.22–5.81)
RDW: 12.4 % (ref 11.5–15.5)
WBC: 7.2 10*3/uL (ref 4.0–10.5)
nRBC: 0 % (ref 0.0–0.2)

## 2023-07-25 LAB — IRON AND TIBC
Iron: 44 ug/dL — ABNORMAL LOW (ref 45–182)
Saturation Ratios: 12 % — ABNORMAL LOW (ref 17.9–39.5)
TIBC: 356 ug/dL (ref 250–450)
UIBC: 312 ug/dL

## 2023-07-25 LAB — FERRITIN: Ferritin: 85 ng/mL (ref 24–336)

## 2023-07-25 LAB — LACTATE DEHYDROGENASE: LDH: 126 U/L (ref 98–192)

## 2023-07-27 ENCOUNTER — Other Ambulatory Visit: Payer: BC Managed Care – PPO

## 2023-07-27 ENCOUNTER — Ambulatory Visit: Payer: BC Managed Care – PPO | Admitting: Oncology

## 2023-07-31 ENCOUNTER — Encounter: Payer: Self-pay | Admitting: Oncology

## 2023-07-31 NOTE — Progress Notes (Signed)
Hematology/Oncology Consult note [facility]  [phone] [phone]  Patient Care Team: [name], MD as PCP - General (Family Medicine) Creig Hines, MD as Consulting Physician (Oncology)   Name of the patient: Erik Buchanan  [id number]  1961-07-12   Date of visit: 07/31/23  Diagnosis- stage III classical Hodgkin's lymphoma currently in CR 1    Chief complaint/ Reason for visit- routine f/u of hodgkins lymphoma  Heme/Onc history: patient is a 63 year old male with a past medical history significant for hypertension and diabetes among other medical problems.  He was noted to have left-sided neck swelling which has been ongoing for the last 4 months but he feels that the swelling has slowly increased in size.  He was seen by Dr. Burnett Sheng and underwent CT chest and CT soft tissue neck after initial ultrasound of the neck.  CT showed bulky supraclavicular adenopathy with the largest node measuring 4.1 cm CT chest with contrast also showed similar findings and no evidence of right supraclavicular axillary mediastinal or hilar adenopathy.  Lungs were clear.  Multiple hypodense lesions in the spleen which nearly replaces the splenic parenchyma.  Spleen however does not appear enlarged.    PET CT scan showed FDG avid adenopathy within the lower left neck to left supraclavicular region as well as small bowel mesentery peritoneum and porta hepatic lesions.  Multiple FDG avid splenic lesions.  Mild diffuse increased uptake in the bone marrow nonspecific.  No focal areas of increased uptake to suggest bone metastases.   Excisional supraclavicular lymph node biopsy showed classical Hodgkin's lymphoma. The H&E stained sections reveal lymph node effaced by a somewhat nodular proliferation of large, abnormal lymphoid cells, including "mummified" cells, "lacunar" cells, and occasional binucleated cells with classic Reed-Sternberg morphology, in a  background of predominantly small lymphocytes.  EBV negative   Stage III classical Hodgkin's lymphoma IPI score 2.5-year freedom from progression of 80% and 91% overall survival.   Patient completed 6 cycles of BV AVD chemotherapy on 11/11/2021.  However he did not receive cycle 6-day 15 due to worsening neuropathy    Interval history- Overall he is doing well. Symptoms of heartburn have improved. Neuropathy is mild and stable  ECOG PS- 0 Pain scale- 0   Review of systems- Review of Systems  Constitutional:  Negative for chills, fever, malaise/fatigue and weight loss.  HENT:  Negative for congestion, ear discharge and nosebleeds.   Eyes:  Negative for blurred vision.  Respiratory:  Negative for cough, hemoptysis, sputum production, shortness of breath and wheezing.   Cardiovascular:  Negative for chest pain, palpitations, orthopnea and claudication.  Gastrointestinal:  Negative for abdominal pain, blood in stool, constipation, diarrhea, heartburn, melena, nausea and vomiting.  Genitourinary:  Negative for dysuria, flank pain, frequency, hematuria and urgency.  Musculoskeletal:  Negative for back pain, joint pain and myalgias.  Skin:  Negative for rash.  Neurological:  Positive for sensory change (peripheral neuropathy). Negative for dizziness, tingling, focal weakness, seizures, weakness and headaches.  Endo/Heme/Allergies:  Does not bruise/bleed easily.  Psychiatric/Behavioral:  Negative for depression and suicidal ideas. The patient does not have insomnia.       Allergies  Allergen Reactions   Ultram [Tramadol Hcl] Other (See Comments)    vertigo and passed out     Past Medical History:  Diagnosis Date   Anemia    Colon polyps    Depression    Diabetes mellitus without complication (HCC)    GERD (gastroesophageal reflux disease)  Hemorrhoids    Hodgkin's lymphoma (HCC)    Hyperlipidemia    Hypertension    Pneumonia    Testicular hypofunction    Tinnitus       Past Surgical History:  Procedure Laterality Date   COLONOSCOPY WITH PROPOFOL N/A 04/07/2015   Procedure: COLONOSCOPY WITH PROPOFOL;  Surgeon: Wallace Cullens, MD;  Location: [facility] ENDOSCOPY;  Service: Gastroenterology;  Laterality: N/A;   COLONOSCOPY WITH PROPOFOL N/A 09/01/2020   Procedure: COLONOSCOPY WITH PROPOFOL;  Surgeon: Toledo, Boykin Nearing, MD;  Location: [facility] ENDOSCOPY;  Service: Gastroenterology;  Laterality: N/A;   EYE SURGERY     cataract   MASS BIOPSY Left 05/07/2021   Procedure: NECK MASS BIOPSY,;  Surgeon: Leafy Ro, MD;  Location: [facility] ORS;  Service: General;  Laterality: Left;  Provider requesting 1.5 hours / 90 minutes for procedure.   PORTACATH PLACEMENT Right 05/21/2021   Procedure: INSERTION PORT-A-CATH;  Surgeon: Leafy Ro, MD;  Location: [facility] ORS;  Service: General;  Laterality: Right;    Social History   Socioeconomic History   Marital status: Married    Spouse name: Steward Drone   Number of children: Not on file   Years of education: Not on file   Highest education level: Not on file  Occupational History   Not on file  Tobacco Use   Smoking status: Never   Smokeless tobacco: Never  Vaping Use   Vaping status: Never Used  Substance and Sexual Activity   Alcohol use: Never   Drug use: Never   Sexual activity: Yes  Other Topics Concern   Not on file  Social History Narrative   Not on file   Social Drivers of Health   Financial Resource Strain: Patient Declined (03/07/2023)   Received from [facility]   Overall Financial Resource Strain (CARDIA)    Difficulty of Paying Living Expenses: Patient declined  Food Insecurity: Patient Declined (03/07/2023)   Received from [facility]   Hunger Vital Sign    Worried About Running Out of Food in the Last Year: Patient declined    Ran Out of Food in the Last Year: Patient declined  Transportation Needs: Patient Declined (03/07/2023)   Received from [facility]   PRAPARE - Transportation    In the past 12 months, has lack of transportation kept you from medical appointments or from getting medications?: Patient declined    Lack of Transportation (Non-Medical): Patient declined  Physical Activity: Not on file  Stress: Not on file  Social Connections: Not on file  Intimate Partner Violence: Not on file    Family History  Problem Relation Age of Onset   Arthritis Mother    Stroke Paternal Aunt      Current Outpatient Medications:    amLODipine (NORVASC) 5 MG tablet, Take 7.5 mg by mouth daily., Disp: , Rfl:    azelastine (ASTELIN) 0.1 % nasal spray, Place 1 spray into both nostrils 2 (two) times daily as needed for rhinitis. Use in each nostril as directed, Disp: , Rfl:    buPROPion (WELLBUTRIN XL) 150 MG 24 hr tablet, Take 450 mg by mouth daily. (Patient not taking: Reported on 09/28/2022), Disp: , Rfl:    cetirizine (ZYRTEC) 10 MG tablet, Take by mouth., Disp: , Rfl:    glipiZIDE (GLUCOTROL) 10 MG tablet, Take 10 mg by mouth at bedtime., Disp: , Rfl:    Homeopathic Products (LEG CRAMPS) TABS, Take by mouth as needed. Pt taking for leg  cramps., Disp: , Rfl:    ibuprofen (ADVIL,MOTRIN) 200 MG tablet, Take 200-400 mg by mouth every 6 (six) hours as needed for moderate pain., Disp: , Rfl:    Iron-Vitamin C (VITRON-C) 65-125 MG TABS, Take 1 tablet by mouth daily. For iron deficiency, Disp: 30 tablet, Rfl: 2   lisinopril (PRINIVIL,ZESTRIL) 10 MG tablet, Take 15 mg by mouth daily., Disp: , Rfl:    metFORMIN (GLUCOPHAGE) 500 MG tablet, Take 1,000 mg by mouth 2 (two) times daily., Disp: , Rfl:    Multiple Vitamin (MULTIVITAMIN PO), Take 1 Package by mouth daily., Disp: , Rfl:    pantoprazole (PROTONIX) 20 MG tablet, Take 1 tablet (20 mg total) by mouth daily., Disp: 30 tablet, Rfl: 1   simvastatin (ZOCOR) 20 MG tablet, Take 20 mg by mouth every evening., Disp: , Rfl:    sodium chloride (OCEAN) 0.65 % SOLN nasal spray, Place 1 spray into both  nostrils as needed for congestion., Disp: , Rfl:    tadalafil (CIALIS) 20 MG tablet, Take 20 mg by mouth daily as needed., Disp: , Rfl:    tetrahydrozoline-zinc (VISINE-AC) 0.05-0.25 % ophthalmic solution, Place 2 drops into both eyes 3 (three) times daily as needed (dry eyes)., Disp: , Rfl:    Triamcinolone Acetonide (NASACORT AQ NA), Place 2 sprays into both nostrils daily as needed (allergies)., Disp: , Rfl:    trolamine salicylate (ASPERCREME) 10 % cream, Apply 1 application  topically as needed for muscle pain., Disp: , Rfl:  No current facility-administered medications for this visit.  Facility-Administered Medications Ordered in Other Visits:    sodium chloride flush (NS) 0.9 % injection 10 mL, 10 mL, Intravenous, PRN, Creig Hines, MD, 10 mL at 11/11/21 0835  Physical exam:  Vitals:   07/25/23 1525  BP: (!) 146/64  Pulse: 75  Resp: 18  Temp: 98.2 F (36.8 C)  TempSrc: Tympanic  SpO2: 96%  Weight: 256 lb (116.1 kg)   Physical Exam Cardiovascular:     Rate and Rhythm: Normal rate and regular rhythm.     Heart sounds: Normal heart sounds.  Pulmonary:     Effort: Pulmonary effort is normal.     Breath sounds: Normal breath sounds.  Abdominal:     General: Bowel sounds are normal.     Palpations: Abdomen is soft.  Lymphadenopathy:     Comments: No palpable cervical, supraclavicular, axillary or inguinal adenopathy    Skin:    General: Skin is warm and dry.  Neurological:     Mental Status: He is alert and oriented to person, place, and time.         Latest Ref Rng & Units 07/25/2023    3:07 PM  CMP  Glucose 70 - 99 mg/dL 272   BUN 6 - 20 mg/dL 16   Creatinine 5.36 - 1.24 mg/dL 6.44   Sodium 034 - 742 mmol/L 136   Potassium 3.5 - 5.1 mmol/L 3.8   Chloride 98 - 111 mmol/L 102   CO2 22 - 32 mmol/L 24   Calcium 8.9 - 10.3 mg/dL 8.9   Total Protein 6.5 - 8.1 g/dL 6.8   Total Bilirubin 0.0 - 1.2 mg/dL 0.5   Alkaline Phos 38 - 126 U/L 49   AST 15 - 41 U/L 22    ALT 0 - 44 U/L 36       Latest Ref Rng & Units 07/25/2023    3:07 PM  CBC  WBC 4.0 - 10.5 K/uL 7.2  Hemoglobin 13.0 - 17.0 g/dL 66.4   Hematocrit 40.3 - 52.0 % 35.1   Platelets 150 - 400 K/uL 239     Assessment and plan- Patient is a 63 y.o. male history of stage III classical Hodgkin's lymphoma status post 6 cycles of BV AVD chemotherapy ending in April 2023 currently in CR 1 here for routine follow-up  Clinically patient is doing well with no concerning signs and symptoms of recurrence based on today's exam.  His last CT scan from June 2024 did not show any evidence of recurrent or progressive disease.  We are nearly at 2 years from the time of completing chemotherapy and I will plan to get a CT scan in 6 months.  I will see him back 2 weeks following scans   Visit Diagnosis 1. Encounter for follow-up surveillance of Hodgkin's disease      Dr. Owens Shark, MD, MPH [facility] [phone] 07/31/2023 5:08 PM

## 2023-12-23 ENCOUNTER — Inpatient Hospital Stay: Payer: BC Managed Care – PPO | Attending: Oncology

## 2023-12-23 ENCOUNTER — Other Ambulatory Visit: Payer: BC Managed Care – PPO

## 2023-12-23 ENCOUNTER — Ambulatory Visit
Admission: RE | Admit: 2023-12-23 | Discharge: 2023-12-23 | Disposition: A | Discharge status: Home / Self Care | Payer: BC Managed Care – PPO | Source: Ambulatory Visit | Attending: Oncology | Admitting: Oncology

## 2023-12-23 DIAGNOSIS — Z8571 Personal history of Hodgkin lymphoma: Secondary | ICD-10-CM | POA: Insufficient documentation

## 2023-12-23 DIAGNOSIS — Z08 Encounter for follow-up examination after completed treatment for malignant neoplasm: Secondary | ICD-10-CM | POA: Diagnosis present

## 2023-12-23 DIAGNOSIS — D509 Iron deficiency anemia, unspecified: Secondary | ICD-10-CM | POA: Insufficient documentation

## 2023-12-23 DIAGNOSIS — Z8572 Personal history of non-Hodgkin lymphomas: Secondary | ICD-10-CM | POA: Insufficient documentation

## 2023-12-23 LAB — POCT I-STAT CREATININE: Creatinine, Ser: 1 mg/dL (ref 0.61–1.24)

## 2023-12-23 MED ORDER — IOHEXOL 350 MG/ML SOLN
75.0000 mL | Freq: Once | INTRAVENOUS | Status: AC | PRN
  Administered 2023-12-23: 75 mL via INTRAVENOUS

## 2024-01-06 ENCOUNTER — Inpatient Hospital Stay

## 2024-01-06 ENCOUNTER — Inpatient Hospital Stay (HOSPITAL_BASED_OUTPATIENT_CLINIC_OR_DEPARTMENT_OTHER): Payer: BC Managed Care – PPO | Admitting: Oncology

## 2024-01-06 ENCOUNTER — Encounter: Payer: Self-pay | Admitting: Oncology

## 2024-01-06 VITALS — BP 126/74 | HR 75 | Temp 98.3°F | Resp 18 | Wt 258.0 lb

## 2024-01-06 DIAGNOSIS — D509 Iron deficiency anemia, unspecified: Secondary | ICD-10-CM

## 2024-01-06 DIAGNOSIS — Z8571 Personal history of Hodgkin lymphoma: Secondary | ICD-10-CM | POA: Diagnosis not present

## 2024-01-06 DIAGNOSIS — Z8572 Personal history of non-Hodgkin lymphomas: Secondary | ICD-10-CM | POA: Diagnosis present

## 2024-01-06 DIAGNOSIS — Z08 Encounter for follow-up examination after completed treatment for malignant neoplasm: Secondary | ICD-10-CM

## 2024-01-06 LAB — IRON AND TIBC
Iron: 61 ug/dL (ref 45–182)
Saturation Ratios: 14 % — ABNORMAL LOW (ref 17.9–39.5)
TIBC: 430 ug/dL (ref 250–450)
UIBC: 369 ug/dL

## 2024-01-06 LAB — CBC (CANCER CENTER ONLY)
HCT: 36.4 % — ABNORMAL LOW (ref 39.0–52.0)
Hemoglobin: 12.4 g/dL — ABNORMAL LOW (ref 13.0–17.0)
MCH: 27.5 pg (ref 26.0–34.0)
MCHC: 34.1 g/dL (ref 30.0–36.0)
MCV: 80.7 fL (ref 80.0–100.0)
Platelet Count: 238 10*3/uL (ref 150–400)
RBC: 4.51 MIL/uL (ref 4.22–5.81)
RDW: 13.5 % (ref 11.5–15.5)
WBC Count: 8.5 10*3/uL (ref 4.0–10.5)
nRBC: 0 % (ref 0.0–0.2)

## 2024-01-06 LAB — FERRITIN: Ferritin: 24 ng/mL (ref 24–336)

## 2024-01-06 NOTE — Progress Notes (Unsigned)
 Patient is doing ok, no new questions or concerns for the doctor today.

## 2024-01-07 ENCOUNTER — Ambulatory Visit: Payer: Self-pay | Admitting: Oncology

## 2024-01-08 ENCOUNTER — Encounter: Payer: Self-pay | Admitting: Oncology

## 2024-01-08 NOTE — Progress Notes (Signed)
 Hematology/Oncology Consult note [facility]  [phone] [phone]  Patient Care Team: Valora Agent, MD as PCP - General (Family Medicine) Melanee Annah BROCKS, MD as Consulting Physician (Oncology)   Name of the patient: Erik Buchanan  [id number]  11-23-1960   Date of visit: 01/08/24  Diagnosis-  stage III classical Hodgkin's lymphoma currently in CR 1   History of iron  deficiency anemia  Chief complaint/ Reason for visit-routine follow-up of Hodgkin's lymphoma  Heme/Onc history: patient is a 63 year old male with a past medical history significant for hypertension and diabetes among other medical problems.  He was noted to have left-sided neck swelling which has been ongoing for the last 4 months but he feels that the swelling has slowly increased in size.  He was seen by Dr. Valora and underwent CT chest and CT soft tissue neck after initial ultrasound of the neck.  CT showed bulky supraclavicular adenopathy with the largest node measuring 4.1 cm CT chest with contrast also showed similar findings and no evidence of right supraclavicular axillary mediastinal or hilar adenopathy.  Lungs were clear.  Multiple hypodense lesions in the spleen which nearly replaces the splenic parenchyma.  Spleen however does not appear enlarged.    PET CT scan showed FDG avid adenopathy within the lower left neck to left supraclavicular region as well as small bowel mesentery peritoneum and porta hepatic lesions.  Multiple FDG avid splenic lesions.  Mild diffuse increased uptake in the bone marrow nonspecific.  No focal areas of increased uptake to suggest bone metastases.   Excisional supraclavicular lymph node biopsy showed classical Hodgkin's lymphoma. The H&E stained sections reveal lymph node effaced by a somewhat nodular proliferation of large, abnormal lymphoid cells, including mummified cells, lacunar cells, and occasional binucleated cells with  classic Reed-Sternberg morphology, in a background of predominantly small lymphocytes.  EBV negative   Stage III classical Hodgkin's lymphoma IPI score 2.5-year freedom from progression of 80% and 91% overall survival.   Patient completed 6 cycles of BV AVD chemotherapy on 11/11/2021.  However he did not receive cycle 6-day 15 due to worsening neuropathy    Interval history-overall he is feeling well.  He is doing a full-time job which often involves strenuous physical activity which he is able to handle.  He denies any blood loss in the stool or urine.  Denies any dark melanotic stools  ECOG PS- 0 Pain scale- 0   Review of systems- Review of Systems  Constitutional:  Negative for chills, fever, malaise/fatigue and weight loss.  HENT:  Negative for congestion, ear discharge and nosebleeds.   Eyes:  Negative for blurred vision.  Respiratory:  Negative for cough, hemoptysis, sputum production, shortness of breath and wheezing.   Cardiovascular:  Negative for chest pain, palpitations, orthopnea and claudication.  Gastrointestinal:  Negative for abdominal pain, blood in stool, constipation, diarrhea, heartburn, melena, nausea and vomiting.  Genitourinary:  Negative for dysuria, flank pain, frequency, hematuria and urgency.  Musculoskeletal:  Negative for back pain, joint pain and myalgias.  Skin:  Negative for rash.  Neurological:  Negative for dizziness, tingling, focal weakness, seizures, weakness and headaches.  Endo/Heme/Allergies:  Does not bruise/bleed easily.  Psychiatric/Behavioral:  Negative for depression and suicidal ideas. The patient does not have insomnia.       Allergies  Allergen Reactions   Ultram [Tramadol Hcl] Other (See Comments)    vertigo and passed out     Past Medical History:  Diagnosis Date   Anemia  Colon polyps    Depression    Diabetes mellitus without complication (HCC)    GERD (gastroesophageal reflux disease)    Hemorrhoids    Hodgkin's lymphoma  (HCC)    Hyperlipidemia    Hypertension    Pneumonia    Testicular hypofunction    Tinnitus      Past Surgical History:  Procedure Laterality Date   COLONOSCOPY WITH PROPOFOL  N/A 04/07/2015   Procedure: COLONOSCOPY WITH PROPOFOL ;  Surgeon: Deward CINDERELLA Piedmont, MD;  Location: [facility] ENDOS[facility];  Service: Gastroenterology;  Laterality: N/A;   COLONOSCOPY WITH PROPOFOL  N/A 09/01/2020   Procedure: COLONOSCOPY WITH PROPOFOL ;  Surgeon: Toledo, Ladell POUR, MD;  Location: [facility] ENDOSCOPY;  Service: Gastroenterology;  Laterality: N/A;   EYE SURGERY     cataract   MASS BIOPSY Left 05/07/2021   Procedure: NECK MASS BIOPSY,;  Surgeon: Jordis Laneta FALCON, MD;  Location: [facility] ORS;  Service: General;  Laterality: Left;  Provider requesting 1.5 hours / 90 minutes for procedure.   PORTACATH PLACEMENT Right 05/21/2021   Procedure: INSERTION PORT-A-CATH;  Surgeon: Jordis Laneta FALCON, MD;  Location: [facility] ORS;  Service: General;  Laterality: Right;    Social History   Socioeconomic History   Marital status: Married    Spouse name: Erminio   Number of children: Not on file   Years of education: Not on file   Highest education level: Not on file  Occupational History   Not on file  Tobacco Use   Smoking status: Never   Smokeless tobacco: Never  Vaping Use   Vaping status: Never Used  Substance and Sexual Activity   Alcohol use: Never   Drug use: Never   Sexual activity: Yes  Other Topics Concern   Not on file  Social History Narrative   Not on file   Social Drivers of Health   Financial Resource Strain: Patient Declined (03/07/2023)   Received from [facility]   Overall Financial Resource Strain (CARDIA)    Difficulty of Paying Living Expenses: Patient declined  Food Insecurity: Patient Declined (03/07/2023)   Received from [facility]   Hunger Vital Sign    Within the past 12 months, you worried that your food would run out before you got the money to buy more.: Patient  declined    Within the past 12 months, the food you bought just didn't last and you didn't have money to get more.: Patient declined  Transportation Needs: Patient Declined (03/07/2023)   Received from [facility] - Transportation    In the past 12 months, has lack of transportation kept you from medical appointments or from getting medications?: Patient declined    Lack of Transportation (Non-Medical): Patient declined  Physical Activity: Not on file  Stress: Not on file  Social Connections: Not on file  Intimate Partner Violence: Not on file    Family History  Problem Relation Age of Onset   Arthritis Mother    Stroke Paternal Aunt      Current Outpatient Medications:    amLODipine (NORVASC) 5 MG tablet, Take 7.5 mg by mouth daily., Disp: , Rfl:    azelastine (ASTELIN) 0.1 % nasal spray, Place 1 spray into both nostrils 2 (two) times daily as needed for rhinitis. Use in each nostril as directed, Disp: , Rfl:    cetirizine (ZYRTEC) 10 MG tablet, Take by mouth., Disp: , Rfl:    glipiZIDE (GLUCOTROL) 10 MG tablet, Take 10 mg by mouth at  bedtime., Disp: , Rfl:    Homeopathic Products (LEG CRAMPS) TABS, Take by mouth as needed. Pt taking for leg cramps., Disp: , Rfl:    ibuprofen (ADVIL,MOTRIN) 200 MG tablet, Take 200-400 mg by mouth every 6 (six) hours as needed for moderate pain., Disp: , Rfl:    Iron -Vitamin C (VITRON-C) 65-125 MG TABS, Take 1 tablet by mouth daily. For iron  deficiency, Disp: 30 tablet, Rfl: 2   lisinopril (PRINIVIL,ZESTRIL) 10 MG tablet, Take 15 mg by mouth daily., Disp: , Rfl:    metFORMIN (GLUCOPHAGE) 500 MG tablet, Take 1,000 mg by mouth 2 (two) times daily., Disp: , Rfl:    Multiple Vitamin (MULTIVITAMIN PO), Take 1 Package by mouth daily., Disp: , Rfl:    pantoprazole  (PROTONIX ) 20 MG tablet, Take 1 tablet (20 mg total) by mouth daily., Disp: 30 tablet, Rfl: 1   simvastatin (ZOCOR) 20 MG tablet, Take 20 mg by mouth every evening.,  Disp: , Rfl:    sodium chloride  (OCEAN) 0.65 % SOLN nasal spray, Place 1 spray into both nostrils as needed for congestion., Disp: , Rfl:    tadalafil (CIALIS) 20 MG tablet, Take 20 mg by mouth daily as needed., Disp: , Rfl:    tetrahydrozoline-zinc (VISINE-AC) 0.05-0.25 % ophthalmic solution, Place 2 drops into both eyes 3 (three) times daily as needed (dry eyes)., Disp: , Rfl:    Triamcinolone Acetonide (NASACORT AQ NA), Place 2 sprays into both nostrils daily as needed (allergies)., Disp: , Rfl:    trolamine salicylate (ASPERCREME) 10 % cream, Apply 1 application  topically as needed for muscle pain., Disp: , Rfl:    buPROPion (WELLBUTRIN XL) 150 MG 24 hr tablet, Take 450 mg by mouth daily. (Patient not taking: Reported on 01/06/2024), Disp: , Rfl:  No current facility-administered medications for this visit.  Facility-Administered Medications Ordered in Other Visits:    sodium chloride  flush (NS) 0.9 % injection 10 mL, 10 mL, Intravenous, PRN, Melanee Annah BROCKS, MD, 10 mL at 11/11/21 0835  Physical exam:  Vitals:   01/06/24 1458  BP: 126/74  Pulse: 75  Resp: 18  Temp: 98.3 F (36.8 C)  TempSrc: Tympanic  SpO2: 96%  Weight: 258 lb (117 kg)   Physical Exam  Cardiovascular:     Rate and Rhythm: Normal rate and regular rhythm.     Heart sounds: Normal heart sounds.  Pulmonary:     Effort: Pulmonary effort is normal.     Breath sounds: Normal breath sounds.  Abdominal:     General: Bowel sounds are normal. There is no distension.     Palpations: Abdomen is soft.     Tenderness: There is no abdominal tenderness.  Lymphadenopathy:     Comments: No palpable cervical, supraclavicular, axillary or inguinal adenopathy     Skin:    General: Skin is warm and dry.   Neurological:     Mental Status: He is alert and oriented to person, place, and time.      I have personally reviewed labs listed below:    Latest Ref Rng & Units 12/23/2023   10:24 AM  CMP  Creatinine 0.61 - 1.24  mg/dL 8.99       Latest Ref Rng & Units 01/06/2024    3:36 PM  CBC  WBC 4.0 - 10.5 K/uL 8.5   Hemoglobin 13.0 - 17.0 g/dL 87.5   Hematocrit 60.9 - 52.0 % 36.4   Platelets 150 - 400 K/uL 238    I have personally reviewed Radiology  images listed below: No images are attached to the encounter.  CT CHEST ABDOMEN PELVIS W CONTRAST Result Date: 12/23/2023 CLINICAL DATA:  Surveillance of Hodgkin's disease, follow-up. * Tracking Code: BO * EXAM: CT CHEST, ABDOMEN, AND PELVIS WITH CONTRAST TECHNIQUE: Multidetector CT imaging of the chest, abdomen and pelvis was performed following the standard protocol during bolus administration of intravenous contrast. RADIATION DOSE REDUCTION: This exam was performed according to the departmental dose-optimization program which includes automated exposure control, adjustment of the mA and/or kV according to patient size and/or use of iterative reconstruction technique. CONTRAST:  75mL OMNIPAQUE  IOHEXOL  350 MG/ML SOLN COMPARISON:  Multiple priors including CT January 07, 2023 FINDINGS: CT CHEST FINDINGS Cardiovascular: Normal-sized thoracic aorta. Normal size heart. No significant pericardial effusion/thickening. Mediastinum/Nodes: No suspicious thyroid nodule. Left neck surgical clip/calcifications unchanged. No pathologically enlarged mediastinal, hilar or axillary lymph nodes. Patulous esophagus. Tiny hiatal hernia. Lungs/Pleura: No suspicious pulmonary nodules or masses. Hypoventilatory change in the dependent lungs. Musculoskeletal: Multilevel degenerative change of the spine. Bridging anterior vertebral osteophytes. No aggressive lytic or blastic lesion of bone. CT ABDOMEN PELVIS FINDINGS Hepatobiliary: Similar hypodense hepatic lesions not hyperintense on prior PET-CT and likely reflecting cysts. No solid enhancing hepatic lesion. Diffuse hepatic steatosis with focal fatty sparing along the gallbladder fossa. Gallbladder is unremarkable. No biliary ductal dilation.  Pancreas: No pancreatic ductal dilation or evidence of acute inflammation. Spleen: No splenomegaly. Adrenals/Urinary Tract: Bilateral adrenal glands appear normal. No hydronephrosis. Kidneys demonstrate symmetric enhancement. Urinary bladder is unremarkable for degree of distension. Stomach/Bowel: No pathologic dilation large or small bowel. Radiopaque enteric contrast material traverses the ascending colon. No evidence of acute bowel inflammation. Colonic diverticulosis. Vascular/Lymphatic: Normal caliber abdominal aorta. Smooth IVC contours. Similar size of the lymph nodes in the jejunal mesentery measuring up to 7 mm. No new pathologically enlarged abdominal or pelvic lymph nodes. Reproductive: Dystrophic calcifications in an enlarged prostate gland. Other: No significant abdominopelvic free fluid. Musculoskeletal: Anterior compression deformity L1 is unchanged. No aggressive lytic or blastic lesion of bone. Multilevel degenerative change of the spine. IMPRESSION: 1. Similar size of the lymph nodes in the jejunal mesentery measuring up to 7 mm. No new pathologically enlarged lymph nodes in the chest, abdomen or pelvis. 2. No splenomegaly. 3. Diffuse hepatic steatosis. 4. Colonic diverticulosis without evidence of acute diverticulitis. Electronically Signed   By: Reyes Holder M.D.   On: 12/23/2023 10:55     Assessment and plan- Patient is a 63 y.o. male history of stage III classical Hodgkin's lymphoma status post 6 cycles of BV AVD chemotherapy ending in April 2023.  He is currently in CR 1 and this is a routine follow-up visit  I have Reviewed CT chest abdomen pelvis images independently and discussed findings with the patient which does not show any evidence of recurrent disease.  Patient is now 2 years out of his Hodgkin's lymphoma diagnosis and therefore does not require any surveillance scans moving forward.  Scans will be done on the basis of suspicious signs and symptoms only.  Clinically he is  doing well with no concerning signs and symptoms of recurrence based on today's exam.  I will see him back in 6 months with CBC with differential CMP LDH ferritin and iron  studies  History of iron  deficiency anemia: Patient last received IV iron  about a year ago.  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  Today he is not anemic with a hemoglobin of 12.4 but continues to have  evidence of iron  deficiency given that his ferritin is 24 with an iron  saturation of 14%.  Discussed with send benefits of IV iron  including all but not limited to possible risk of infusion reaction and patient understands and agrees to proceed if need be but we will reach out to him since these results came back after the patient had left the clinic.  I will also have him reach out to [facility] for consideration of EGD or other endoscopic evaluation if need be given his ongoing iron  deficiency.   Visit Diagnosis 1. Iron  deficiency anemia, unspecified iron  deficiency anemia type   2. Encounter for follow-up surveillance of Hodgkin's disease      Dr. Annah Skene, MD, MPH [facility] at [facility] [phone] 01/08/2024 9:09 AM

## 2024-01-09 ENCOUNTER — Telehealth: Payer: Self-pay

## 2024-01-09 NOTE — Telephone Encounter (Signed)
 Per Dr. Melanee He is iron  deficient. Does he want IV iron ?.  Outbound call to patient; informed of above.  Patient is agreeable to receive IV iron ; informed scheduling will be in touch shortly to coordinate.  Patient verbalized understanding.

## 2024-01-09 NOTE — Telephone Encounter (Signed)
-----   Message from Erik Buchanan sent at 01/07/2024  4:44 PM EDT ----- He is iron  deficient. Does he want IV iron ? ----- Message ----- From: Rebecka, [facility] [place] Sent: 01/06/2024   3:44 PM EDT To: Erik JAYSON Skene, MD

## 2024-01-09 NOTE — Telephone Encounter (Signed)
 Per Dr. Melanee Please have him reach out to [facility] regarding need for possible EGD given that he is having ongoing iron  deficiency anemia.  Outbound call to patient; informed of above.  No further follow up needed at this time.

## 2024-01-18 ENCOUNTER — Inpatient Hospital Stay: Attending: Oncology

## 2024-01-18 VITALS — BP 156/88 | HR 65 | Temp 97.0°F | Resp 18

## 2024-01-18 DIAGNOSIS — Z8572 Personal history of non-Hodgkin lymphomas: Secondary | ICD-10-CM | POA: Insufficient documentation

## 2024-01-18 DIAGNOSIS — D509 Iron deficiency anemia, unspecified: Secondary | ICD-10-CM | POA: Insufficient documentation

## 2024-01-18 MED ORDER — IRON SUCROSE 20 MG/ML IV SOLN
200.0000 mg | Freq: Once | INTRAVENOUS | Status: AC
  Administered 2024-01-18: 200 mg via INTRAVENOUS
  Filled 2024-01-18: qty 10

## 2024-01-18 NOTE — Patient Instructions (Signed)

## 2024-01-20 ENCOUNTER — Inpatient Hospital Stay

## 2024-01-25 ENCOUNTER — Inpatient Hospital Stay

## 2024-01-25 VITALS — BP 155/75 | HR 65 | Temp 97.3°F | Resp 18

## 2024-01-25 DIAGNOSIS — D509 Iron deficiency anemia, unspecified: Secondary | ICD-10-CM | POA: Diagnosis not present

## 2024-01-25 MED ORDER — IRON SUCROSE 20 MG/ML IV SOLN
200.0000 mg | Freq: Once | INTRAVENOUS | Status: AC
  Administered 2024-01-25: 200 mg via INTRAVENOUS
  Filled 2024-01-25: qty 10

## 2024-01-25 NOTE — Patient Instructions (Signed)

## 2024-01-27 ENCOUNTER — Inpatient Hospital Stay

## 2024-02-01 ENCOUNTER — Inpatient Hospital Stay

## 2024-02-01 VITALS — BP 158/85 | HR 62 | Temp 97.1°F | Resp 18

## 2024-02-01 DIAGNOSIS — D509 Iron deficiency anemia, unspecified: Secondary | ICD-10-CM

## 2024-02-01 MED ORDER — IRON SUCROSE 20 MG/ML IV SOLN
200.0000 mg | Freq: Once | INTRAVENOUS | Status: AC
Start: 2024-02-01 — End: 2024-02-01
  Administered 2024-02-01: 200 mg via INTRAVENOUS
  Filled 2024-02-01: qty 10

## 2024-02-03 ENCOUNTER — Inpatient Hospital Stay

## 2024-02-08 ENCOUNTER — Inpatient Hospital Stay

## 2024-02-08 VITALS — BP 149/66 | HR 63 | Temp 96.9°F | Resp 18

## 2024-02-08 DIAGNOSIS — D509 Iron deficiency anemia, unspecified: Secondary | ICD-10-CM

## 2024-02-08 MED ORDER — IRON SUCROSE 20 MG/ML IV SOLN
200.0000 mg | Freq: Once | INTRAVENOUS | Status: AC
  Administered 2024-02-08: 200 mg via INTRAVENOUS
  Filled 2024-02-08: qty 10

## 2024-02-10 ENCOUNTER — Inpatient Hospital Stay

## 2024-02-15 ENCOUNTER — Inpatient Hospital Stay: Attending: Oncology

## 2024-02-15 VITALS — BP 157/73 | HR 58 | Temp 96.0°F | Resp 18

## 2024-02-15 DIAGNOSIS — D509 Iron deficiency anemia, unspecified: Secondary | ICD-10-CM | POA: Insufficient documentation

## 2024-02-15 MED ORDER — IRON SUCROSE 20 MG/ML IV SOLN
200.0000 mg | Freq: Once | INTRAVENOUS | Status: AC
  Administered 2024-02-15: 200 mg via INTRAVENOUS
  Filled 2024-02-15: qty 10

## 2024-02-17 ENCOUNTER — Inpatient Hospital Stay

## 2024-07-16 DIAGNOSIS — D509 Iron deficiency anemia, unspecified: Secondary | ICD-10-CM

## 2024-07-20 ENCOUNTER — Inpatient Hospital Stay: Admitting: Nurse Practitioner

## 2024-07-20 ENCOUNTER — Inpatient Hospital Stay: Attending: Oncology

## 2024-07-20 ENCOUNTER — Encounter: Payer: Self-pay | Admitting: Nurse Practitioner

## 2024-07-20 VITALS — BP 146/69 | HR 58 | Temp 97.5°F | Resp 16 | Wt 251.0 lb

## 2024-07-20 DIAGNOSIS — Z8571 Personal history of Hodgkin lymphoma: Secondary | ICD-10-CM | POA: Diagnosis not present

## 2024-07-20 DIAGNOSIS — D509 Iron deficiency anemia, unspecified: Secondary | ICD-10-CM

## 2024-07-20 DIAGNOSIS — Z08 Encounter for follow-up examination after completed treatment for malignant neoplasm: Secondary | ICD-10-CM

## 2024-07-20 LAB — CMP (CANCER CENTER ONLY)
ALT: 36 U/L (ref 0–44)
AST: 22 U/L (ref 15–41)
Albumin: 4.6 g/dL (ref 3.5–5.0)
Alkaline Phosphatase: 60 U/L (ref 38–126)
Anion gap: 10 (ref 5–15)
BUN: 19 mg/dL (ref 8–23)
CO2: 24 mmol/L (ref 22–32)
Calcium: 9.5 mg/dL (ref 8.9–10.3)
Chloride: 105 mmol/L (ref 98–111)
Creatinine: 1.2 mg/dL (ref 0.61–1.24)
GFR, Estimated: >60 mL/min
Glucose, Bld: 91 mg/dL (ref 70–99)
Potassium: 4.2 mmol/L (ref 3.5–5.1)
Sodium: 139 mmol/L (ref 135–145)
Total Bilirubin: 0.3 mg/dL (ref 0.0–1.2)
Total Protein: 7.2 g/dL (ref 6.5–8.1)

## 2024-07-20 LAB — CBC WITH DIFFERENTIAL (CANCER CENTER ONLY)
Abs Immature Granulocytes: 0.03 K/uL (ref 0.00–0.07)
Basophils Absolute: 0 K/uL (ref 0.0–0.1)
Basophils Relative: 0 %
Eosinophils Absolute: 0.2 K/uL (ref 0.0–0.5)
Eosinophils Relative: 2 %
HCT: 37.6 % — ABNORMAL LOW (ref 39.0–52.0)
Hemoglobin: 13 g/dL (ref 13.0–17.0)
Immature Granulocytes: 0 %
Lymphocytes Relative: 29 %
Lymphs Abs: 2.2 K/uL (ref 0.7–4.0)
MCH: 29.1 pg (ref 26.0–34.0)
MCHC: 34.6 g/dL (ref 30.0–36.0)
MCV: 84.3 fL (ref 80.0–100.0)
Monocytes Absolute: 0.5 K/uL (ref 0.1–1.0)
Monocytes Relative: 6 %
Neutro Abs: 4.6 K/uL (ref 1.7–7.7)
Neutrophils Relative %: 63 %
Platelet Count: 215 K/uL (ref 150–400)
RBC: 4.46 MIL/uL (ref 4.22–5.81)
RDW: 11.9 % (ref 11.5–15.5)
WBC Count: 7.4 K/uL (ref 4.0–10.5)
nRBC: 0 % (ref 0.0–0.2)

## 2024-07-20 LAB — IRON AND TIBC
Iron: 78 ug/dL (ref 45–182)
Saturation Ratios: 22 % (ref 17.9–39.5)
TIBC: 358 ug/dL (ref 250–450)
UIBC: 280 ug/dL

## 2024-07-20 LAB — LACTATE DEHYDROGENASE: LDH: 156 U/L (ref 105–235)

## 2024-07-20 LAB — FERRITIN: Ferritin: 207 ng/mL (ref 24–336)

## 2024-07-20 NOTE — Addendum Note (Signed)
 Addended by: JASMINE DELON POUR on: 07/20/2024 04:18 PM   Modules accepted: Orders

## 2024-07-20 NOTE — Progress Notes (Signed)
 "  Hematology/Oncology Consult note [facility]  [phone] [phone]  Patient Care Team: Valora Lynwood FALCON, MD as PCP - General (Family Medicine) Melanee Annah BROCKS, MD as Consulting Physician (Oncology)   Name of the patient: Erik Buchanan  [id number]  11/06/1960   Date of visit: 07/20/2024  Diagnosis-  stage III classical Hodgkin's lymphoma currently in CR 1   History of iron  deficiency anemia  Chief complaint/ Reason for visit- routine follow-up of Hodgkin's lymphoma  Heme/Onc history: patient is a 64 year old male with a past medical history significant for hypertension and diabetes among other medical problems.  He was noted to have left-sided neck swelling which has been ongoing for the last 4 months but he feels that the swelling has slowly increased in size.  He was seen by Dr. Valora and underwent CT chest and CT soft tissue neck after initial ultrasound of the neck.  CT showed bulky supraclavicular adenopathy with the largest node measuring 4.1 cm CT chest with contrast also showed similar findings and no evidence of right supraclavicular axillary mediastinal or hilar adenopathy.  Lungs were clear.  Multiple hypodense lesions in the spleen which nearly replaces the splenic parenchyma.  Spleen however does not appear enlarged.    PET CT scan showed FDG avid adenopathy within the lower left neck to left supraclavicular region as well as small bowel mesentery peritoneum and porta hepatic lesions.  Multiple FDG avid splenic lesions.  Mild diffuse increased uptake in the bone marrow nonspecific.  No focal areas of increased uptake to suggest bone metastases.   Excisional supraclavicular lymph node biopsy showed classical Hodgkin's lymphoma. The H&E stained sections reveal lymph node effaced by a somewhat nodular proliferation of large, abnormal lymphoid cells, including mummified cells, lacunar cells, and occasional binucleated cells with  classic Reed-Sternberg morphology, in a background of predominantly small lymphocytes.  EBV negative   Stage III classical Hodgkin's lymphoma IPI score 2.5-year freedom from progression of 80% and 91% overall survival.   Patient completed 6 cycles of BV AVD chemotherapy on 11/11/2021.  However he did not receive cycle 6-day 15 due to worsening neuropathy    Interval history- Erik Buchanan is a 64 y.o. male with above history of lymphoma who returns to clinic for surveillance and follow up. Denies any neurologic complaints. Denies recent fevers or illnesses. Denies any easy bleeding or bruising. No melena or hematochezia. Denies sweats, new lumps or bumps. No pica or restless leg. Reports good appetite and denies weight loss. Denies chest pain. Denies any nausea, vomiting, constipation, or diarrhea. Denies urinary complaints. Patient offers no further specific complaints today.  ECOG PS- 0 Pain scale- 0  Review of systems- Review of Systems  Constitutional:  Negative for chills, fever, malaise/fatigue and weight loss.  HENT:  Negative for congestion, ear discharge and nosebleeds.   Eyes:  Negative for blurred vision.  Respiratory:  Negative for cough, hemoptysis, sputum production, shortness of breath and wheezing.   Cardiovascular:  Negative for chest pain, palpitations, orthopnea and claudication.  Gastrointestinal:  Negative for abdominal pain, blood in stool, constipation, diarrhea, heartburn, melena, nausea and vomiting.  Genitourinary:  Negative for dysuria, flank pain, frequency, hematuria and urgency.  Musculoskeletal:  Negative for back pain, joint pain and myalgias.  Skin:  Negative for rash.  Neurological:  Negative for dizziness, tingling, focal weakness, seizures, weakness and headaches.  Endo/Heme/Allergies:  Does not bruise/bleed easily.  Psychiatric/Behavioral:  Negative for depression and suicidal ideas. The patient does not have  insomnia.     Allergies  Allergen  Reactions   Ultram [Tramadol Hcl] Other (See Comments)    vertigo and passed out   Past Medical History:  Diagnosis Date   Anemia    Colon polyps    Depression    Diabetes mellitus without complication (HCC)    GERD (gastroesophageal reflux disease)    Hemorrhoids    Hodgkin's lymphoma (HCC)    Hyperlipidemia    Hypertension    Pneumonia    Testicular hypofunction    Tinnitus    Past Surgical History:  Procedure Laterality Date   COLONOSCOPY WITH PROPOFOL  N/A 04/07/2015   Procedure: COLONOSCOPY WITH PROPOFOL ;  Surgeon: Deward CINDERELLA Piedmont, MD;  Location: [facility] ENDOSCOPY;  Service: Gastroenterology;  Laterality: N/A;   COLONOSCOPY WITH PROPOFOL  N/A 09/01/2020   Procedure: COLONOSCOPY WITH PROPOFOL ;  Surgeon: Toledo, Ladell POUR, MD;  Location: [facility] ENDOSCOPY;  Service: Gastroenterology;  Laterality: N/A;   EYE SURGERY     cataract   MASS BIOPSY Left 05/07/2021   Procedure: NECK MASS BIOPSY,;  Surgeon: Jordis Laneta FALCON, MD;  Location: [facility] ORS;  Service: General;  Laterality: Left;  Provider requesting 1.5 hours / 90 minutes for procedure.   PORTACATH PLACEMENT Right 05/21/2021   Procedure: INSERTION PORT-A-CATH;  Surgeon: Jordis Laneta FALCON, MD;  Location: [facility] ORS;  Service: General;  Laterality: Right;   Social History   Socioeconomic History   Marital status: Married    Spouse name: Erminio   Number of children: Not on file   Years of education: Not on file   Highest education level: Not on file  Occupational History   Not on file  Tobacco Use   Smoking status: Never   Smokeless tobacco: Never  Vaping Use   Vaping status: Never Used  Substance and Sexual Activity   Alcohol use: Never   Drug use: Never   Sexual activity: Yes  Other Topics Concern   Not on file  Social History Narrative   Not on file   Social Drivers of Health   Tobacco Use: Low Risk (07/20/2024)   Patient History    Smoking Tobacco Use: Never    Smokeless Tobacco Use: Never    Passive Exposure: Not on file   Financial Resource Strain: Patient Declined (03/20/2024)   Received from [facility]   Overall Financial Resource Strain (CARDIA)    Difficulty of Paying Living Expenses: Patient declined  Food Insecurity: Patient Declined (03/20/2024)   Received from [facility]   Epic    Within the past 12 months, you worried that your food would run out before you got the money to buy more.: Patient declined    Within the past 12 months, the food you bought just didn't last and you didn't have money to get more.: Patient declined  Transportation Needs: Patient Declined (03/20/2024)   Received from [facility] - [facility]    In the past 12 months, has lack of transportation kept you from medical appointments or from getting medications?: Patient declined    Lack of Transportation (Non-Medical): Patient declined  Physical Activity: Not on file  Stress: Not on file  Social Connections: Not on file  Intimate Partner Violence: Not on file  Depression [phone]): Low Risk (07/20/2024)   Depression (PHQ2-9)    PHQ-2 Score: 0  Alcohol Screen: Not on file  Housing: Patient Declined (03/20/2024)   Received from [facility]   Epic  In the last 12 months, was there a time when you were not able to pay the mortgage or rent on time?: Patient declined    Number of Times Moved in the Last Year: Not on file    At any time in the past 12 months, were you homeless or living in a shelter (including now)?: Patient declined  Utilities: Patient Declined (03/20/2024)   Received from [facility]   Epic    In the past 12 months has the electric, gas, oil, or water company threatened to shut off services in your home?: Patient declined  Health Literacy: Not on file   Family History  Problem Relation Age of Onset   Arthritis Mother    Stroke Paternal Aunt     Current Outpatient Medications:    amLODipine (NORVASC) 5 MG  tablet, Take 7.5 mg by mouth daily., Disp: , Rfl:    azelastine (ASTELIN) 0.1 % nasal spray, Place 1 spray into both nostrils 2 (two) times daily as needed for rhinitis. Use in each nostril as directed, Disp: , Rfl:    cetirizine (ZYRTEC) 10 MG tablet, Take by mouth., Disp: , Rfl:    glipiZIDE (GLUCOTROL) 10 MG tablet, Take 10 mg by mouth at bedtime., Disp: , Rfl:    Homeopathic Products (LEG CRAMPS) TABS, Take by mouth as needed. Pt taking for leg cramps., Disp: , Rfl:    ibuprofen (ADVIL,MOTRIN) 200 MG tablet, Take 200-400 mg by mouth every 6 (six) hours as needed for moderate pain., Disp: , Rfl:    Iron -Vitamin C (VITRON-C) 65-125 MG TABS, Take 1 tablet by mouth daily. For iron  deficiency, Disp: 30 tablet, Rfl: 2   lisinopril (PRINIVIL,ZESTRIL) 10 MG tablet, Take 15 mg by mouth daily., Disp: , Rfl:    metFORMIN (GLUCOPHAGE) 500 MG tablet, Take 1,000 mg by mouth 2 (two) times daily., Disp: , Rfl:    MOUNJARO 5 MG/0.5ML Pen, Inject 5 mg into the skin., Disp: , Rfl:    Multiple Vitamin (MULTIVITAMIN PO), Take 1 Package by mouth daily., Disp: , Rfl:    pantoprazole  (PROTONIX ) 20 MG tablet, Take 1 tablet (20 mg total) by mouth daily., Disp: 30 tablet, Rfl: 1   sertraline (ZOLOFT) 100 MG tablet, Take 100 mg by mouth., Disp: , Rfl:    sertraline (ZOLOFT) 50 MG tablet, Take 50 mg by mouth daily., Disp: , Rfl:    simvastatin (ZOCOR) 20 MG tablet, Take 20 mg by mouth every evening., Disp: , Rfl:    sodium chloride  (OCEAN) 0.65 % SOLN nasal spray, Place 1 spray into both nostrils as needed for congestion., Disp: , Rfl:    tadalafil (CIALIS) 20 MG tablet, Take 20 mg by mouth daily as needed., Disp: , Rfl:    tetrahydrozoline-zinc (VISINE-AC) 0.05-0.25 % ophthalmic solution, Place 2 drops into both eyes 3 (three) times daily as needed (dry eyes)., Disp: , Rfl:    Triamcinolone Acetonide (NASACORT AQ NA), Place 2 sprays into both nostrils daily as needed (allergies)., Disp: , Rfl:    trolamine salicylate  (ASPERCREME) 10 % cream, Apply 1 application  topically as needed for muscle pain., Disp: , Rfl:    buPROPion (WELLBUTRIN XL) 150 MG 24 hr tablet, Take 450 mg by mouth daily. (Patient not taking: Reported on 07/20/2024), Disp: , Rfl:  No current facility-administered medications for this visit.  Facility-Administered Medications Ordered in Other Visits:    sodium chloride  flush (NS) 0.9 % injection 10 mL, 10 mL, Intravenous, PRN, Rao, Archana C,  MD, 10 mL at 11/11/21 0835  Physical exam:  Vitals:   07/20/24 1529  BP: (!) 146/69  Pulse: (!) 58  Resp: 16  Temp: (!) 97.5 F (36.4 C)  TempSrc: Tympanic  SpO2: 98%  Weight: 251 lb (113.9 kg)    Physical Exam Vitals reviewed.  Constitutional:      Appearance: He is not ill-appearing.  Cardiovascular:     Rate and Rhythm: Normal rate and regular rhythm.  Pulmonary:     Effort: Pulmonary effort is normal.     Breath sounds: Normal breath sounds.  Abdominal:     General: There is no distension.     Palpations: Abdomen is soft.     Tenderness: There is no abdominal tenderness.  Lymphadenopathy:     Cervical: No cervical adenopathy.     Upper Body:     Right upper body: No supraclavicular or axillary adenopathy.     Left upper body: No supraclavicular or axillary adenopathy.     Lower Body: No right inguinal adenopathy. No left inguinal adenopathy.  Skin:    General: Skin is warm and dry.  Neurological:     Mental Status: He is alert and oriented to person, place, and time.     I have personally reviewed labs listed below:    Latest Ref Rng & Units 07/20/2024    3:12 PM  CMP  Glucose 70 - 99 mg/dL 91   BUN 8 - 23 mg/dL 19   Creatinine 9.38 - 1.24 mg/dL 8.79   Sodium 864 - 854 mmol/L 139   Potassium 3.5 - 5.1 mmol/L 4.2   Chloride 98 - 111 mmol/L 105   CO2 22 - 32 mmol/L 24   Calcium 8.9 - 10.3 mg/dL 9.5   Total Protein 6.5 - 8.1 g/dL 7.2   Total Bilirubin 0.0 - 1.2 mg/dL 0.3   Alkaline Phos 38 - 126 U/L 60   AST 15 - 41  U/L 22   ALT 0 - 44 U/L 36       Latest Ref Rng & Units 07/20/2024    3:12 PM  CBC  WBC 4.0 - 10.5 K/uL 7.4   Hemoglobin 13.0 - 17.0 g/dL 86.9   Hematocrit 60.9 - 52.0 % 37.6   Platelets 150 - 400 K/uL 215    I have personally reviewed radiology images listed below:  No results found.  Assessment and plan- Patient is a 64 y.o. male who returns to clinic for surveillance of:   History of stage III classical Hodgkin's lymphoma - status post 6 cycles of BV AVD chemotherapy ending in April 2023.  He is currently in CR 1. Last imaging was 12/23/23 with NED at that time. Today he is asymptomatic of recurrence and therefore, no imaging indicated. Labs reviewed and reassuring. We will continue surveillance and he will follow up with Dr. Melanee in 6 months with labs at that time (cbc w/ diff, cmp, ldh, ferritin, iron  studies) Iron  deficiency anemia- last received iv venofer  02/15/24. 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 Today he is not anemic. Ferritin and iron  stores are pending. We will hold off on iv iron  today and will base any infusions off pending labs. Would consider egd or other endoscopic evaluation if he has ongoing iron  deficiency.    Visit Diagnosis 1. Encounter for follow-up surveillance of Hodgkin's disease    Tinnie Dawn, DNP, AGNP-C, [facility] [phone] (clinic) 07/20/2024  "

## 2024-07-23 ENCOUNTER — Ambulatory Visit: Payer: Self-pay | Admitting: Nurse Practitioner

## 2025-01-14 ENCOUNTER — Inpatient Hospital Stay

## 2025-01-14 ENCOUNTER — Inpatient Hospital Stay: Admitting: Oncology
